# Patient Record
Sex: Male | Born: 1975
Health system: Southern US, Community
[De-identification: ages and names within clinical notes are randomized; demographics above are authoritative.]

## PROBLEM LIST (undated history)

## (undated) DIAGNOSIS — I70229 Atherosclerosis of native arteries of extremities with rest pain, unspecified extremity: Secondary | ICD-10-CM

## (undated) DIAGNOSIS — Z955 Presence of coronary angioplasty implant and graft: Secondary | ICD-10-CM

## (undated) DIAGNOSIS — I2510 Atherosclerotic heart disease of native coronary artery without angina pectoris: Secondary | ICD-10-CM

## (undated) DIAGNOSIS — I82890 Acute embolism and thrombosis of other specified veins: Secondary | ICD-10-CM

## (undated) DIAGNOSIS — I739 Peripheral vascular disease, unspecified: Secondary | ICD-10-CM

## (undated) DIAGNOSIS — R700 Elevated erythrocyte sedimentation rate: Secondary | ICD-10-CM

## (undated) DIAGNOSIS — U071 COVID-19: Secondary | ICD-10-CM

## (undated) DIAGNOSIS — I214 Non-ST elevation (NSTEMI) myocardial infarction: Secondary | ICD-10-CM

## (undated) DIAGNOSIS — E785 Hyperlipidemia, unspecified: Secondary | ICD-10-CM

## (undated) HISTORY — DX: Atherosclerosis of native arteries of extremities with rest pain, unspecified extremity: I70.229

## (undated) HISTORY — PX: OTHER SURGICAL HISTORY: SHX169

## (undated) HISTORY — DX: Elevated erythrocyte sedimentation rate: R70.0

## (undated) HISTORY — PX: PERCUTANEOUS CORONARY STENT INTERVENTION (PCI-S): SHX6016

## (undated) HISTORY — PX: WRIST SURGERY: SHX841

---

## 2011-11-12 ENCOUNTER — Ambulatory Visit: Payer: Self-pay

## 2016-02-22 ENCOUNTER — Emergency Department (HOSPITAL_COMMUNITY): Payer: 59

## 2016-02-22 ENCOUNTER — Inpatient Hospital Stay (HOSPITAL_COMMUNITY)
Admission: EM | Admit: 2016-02-22 | Discharge: 2016-02-24 | DRG: 247 | Disposition: A | Discharge status: Home / Self Care | Payer: 59 | Attending: Cardiovascular Disease | Admitting: Cardiovascular Disease

## 2016-02-22 ENCOUNTER — Encounter (HOSPITAL_COMMUNITY): Payer: Self-pay | Admitting: Cardiology

## 2016-02-22 ENCOUNTER — Encounter (HOSPITAL_COMMUNITY)
Admission: EM | Disposition: A | Discharge status: Home / Self Care | Payer: Self-pay | Source: Home / Self Care | Attending: Cardiovascular Disease

## 2016-02-22 DIAGNOSIS — I472 Ventricular tachycardia: Secondary | ICD-10-CM | POA: Diagnosis present

## 2016-02-22 DIAGNOSIS — F17210 Nicotine dependence, cigarettes, uncomplicated: Secondary | ICD-10-CM | POA: Diagnosis present

## 2016-02-22 DIAGNOSIS — Z955 Presence of coronary angioplasty implant and graft: Secondary | ICD-10-CM | POA: Insufficient documentation

## 2016-02-22 DIAGNOSIS — R079 Chest pain, unspecified: Secondary | ICD-10-CM | POA: Diagnosis present

## 2016-02-22 DIAGNOSIS — I252 Old myocardial infarction: Secondary | ICD-10-CM | POA: Diagnosis present

## 2016-02-22 DIAGNOSIS — E785 Hyperlipidemia, unspecified: Secondary | ICD-10-CM | POA: Diagnosis present

## 2016-02-22 DIAGNOSIS — I214 Non-ST elevation (NSTEMI) myocardial infarction: Principal | ICD-10-CM | POA: Insufficient documentation

## 2016-02-22 DIAGNOSIS — I2510 Atherosclerotic heart disease of native coronary artery without angina pectoris: Secondary | ICD-10-CM | POA: Diagnosis present

## 2016-02-22 DIAGNOSIS — E784 Other hyperlipidemia: Secondary | ICD-10-CM | POA: Diagnosis not present

## 2016-02-22 DIAGNOSIS — F17200 Nicotine dependence, unspecified, uncomplicated: Secondary | ICD-10-CM | POA: Diagnosis not present

## 2016-02-22 HISTORY — PX: CARDIAC CATHETERIZATION: SHX172

## 2016-02-22 HISTORY — DX: Hyperlipidemia, unspecified: E78.5

## 2016-02-22 HISTORY — DX: Non-ST elevation (NSTEMI) myocardial infarction: I21.4

## 2016-02-22 LAB — BASIC METABOLIC PANEL
Anion gap: 8 (ref 5–15)
BUN: 8 mg/dL (ref 6–20)
CO2: 25 mmol/L (ref 22–32)
Calcium: 8.8 mg/dL — ABNORMAL LOW (ref 8.9–10.3)
Chloride: 104 mmol/L (ref 101–111)
Creatinine, Ser: 0.87 mg/dL (ref 0.61–1.24)
GFR calc Af Amer: >60 mL/min (ref >60)
GFR calc non Af Amer: >60 mL/min (ref >60)
Glucose, Bld: 117 mg/dL — ABNORMAL HIGH (ref 65–99)
Potassium: 4.2 mmol/L (ref 3.5–5.1)
Sodium: 137 mmol/L (ref 135–145)

## 2016-02-22 LAB — TROPONIN I
Troponin I: 0.06 ng/mL — ABNORMAL HIGH (ref <0.031)
Troponin I: 0.27 ng/mL — ABNORMAL HIGH (ref <0.031)

## 2016-02-22 LAB — POCT ACTIVATED CLOTTING TIME
Activated Clotting Time: 126 seconds
Activated Clotting Time: 332 seconds
Activated Clotting Time: 281 seconds

## 2016-02-22 LAB — CBC
HCT: 47.8 % (ref 39.0–52.0)
Hemoglobin: 16.6 g/dL (ref 13.0–17.0)
MCH: 31.5 pg (ref 26.0–34.0)
MCHC: 34.7 g/dL (ref 30.0–36.0)
MCV: 90.7 fL (ref 78.0–100.0)
Platelets: 201 10*3/uL (ref 150–400)
RBC: 5.27 MIL/uL (ref 4.22–5.81)
RDW: 13.1 % (ref 11.5–15.5)
WBC: 8.8 10*3/uL (ref 4.0–10.5)

## 2016-02-22 LAB — APTT: aPTT: 30 seconds (ref 24–37)

## 2016-02-22 LAB — MAGNESIUM: Magnesium: 2.3 mg/dL (ref 1.7–2.4)

## 2016-02-22 LAB — PROTIME-INR
INR: 1.09 (ref 0.00–1.49)
Prothrombin Time: 14.3 seconds (ref 11.6–15.2)

## 2016-02-22 SURGERY — LEFT HEART CATH AND CORONARY ANGIOGRAPHY
Anesthesia: LOCAL

## 2016-02-22 MED ORDER — LIDOCAINE HCL (PF) 1 % IJ SOLN
INTRAMUSCULAR | Status: DC | PRN
  Administered 2016-02-22: 2 mL

## 2016-02-22 MED ORDER — OXYCODONE-ACETAMINOPHEN 5-325 MG PO TABS: 1.0000 | ORAL_TABLET | ORAL | Status: DC | PRN

## 2016-02-22 MED ORDER — SODIUM CHLORIDE 0.9 % WEIGHT BASED INFUSION: 3.0000 mL/kg/h | INTRAVENOUS | Status: DC

## 2016-02-22 MED ORDER — SODIUM CHLORIDE 0.9 % IV SOLN: 250.0000 mL | INTRAVENOUS | Status: DC | PRN

## 2016-02-22 MED ORDER — IOPAMIDOL (ISOVUE-370) INJECTION 76%
INTRAVENOUS | Status: AC
  Filled 2016-02-22: qty 100

## 2016-02-22 MED ORDER — SODIUM CHLORIDE 0.9 % WEIGHT BASED INFUSION: 1.0000 mL/kg/h | INTRAVENOUS | Status: DC

## 2016-02-22 MED ORDER — VERAPAMIL HCL 2.5 MG/ML IV SOLN
INTRAVENOUS | Status: DC | PRN
  Administered 2016-02-22: 10 mL via INTRA_ARTERIAL

## 2016-02-22 MED ORDER — TIROFIBAN HCL IV 12.5 MG/250 ML
INTRAVENOUS | Status: DC | PRN
  Administered 2016-02-22: .15 ug/kg/min via INTRAVENOUS

## 2016-02-22 MED ORDER — IOPAMIDOL (ISOVUE-370) INJECTION 76%
INTRAVENOUS | Status: DC | PRN
  Administered 2016-02-22: 195 mL via INTRAVENOUS

## 2016-02-22 MED ORDER — MORPHINE SULFATE (PF) 2 MG/ML IV SOLN
2.0000 mg | INTRAVENOUS | Status: DC | PRN
  Administered 2016-02-23: 2 mg via INTRAVENOUS
  Filled 2016-02-22: qty 1

## 2016-02-22 MED ORDER — MIDAZOLAM HCL 2 MG/2ML IJ SOLN
INTRAMUSCULAR | Status: AC
  Filled 2016-02-22: qty 2

## 2016-02-22 MED ORDER — HEPARIN SODIUM (PORCINE) 1000 UNIT/ML IJ SOLN
INTRAMUSCULAR | Status: AC
  Filled 2016-02-22: qty 1

## 2016-02-22 MED ORDER — TIROFIBAN HCL IV 12.5 MG/250 ML
INTRAVENOUS | Status: AC
  Filled 2016-02-22: qty 250

## 2016-02-22 MED ORDER — HEPARIN (PORCINE) IN NACL 100-0.45 UNIT/ML-% IJ SOLN
850.0000 [IU]/h | INTRAMUSCULAR | Status: DC
  Administered 2016-02-22: 850 [IU]/h via INTRAVENOUS
  Filled 2016-02-22: qty 250

## 2016-02-22 MED ORDER — HEPARIN SODIUM (PORCINE) 1000 UNIT/ML IJ SOLN
INTRAMUSCULAR | Status: DC | PRN
  Administered 2016-02-22: 5000 [IU] via INTRAVENOUS
  Administered 2016-02-22: 7000 [IU] via INTRAVENOUS

## 2016-02-22 MED ORDER — TICAGRELOR 90 MG PO TABS
90.0000 mg | ORAL_TABLET | Freq: Two times a day (BID) | ORAL | Status: DC
  Administered 2016-02-23 – 2016-02-24 (×4): 90 mg via ORAL
  Filled 2016-02-22 (×4): qty 1

## 2016-02-22 MED ORDER — ONDANSETRON HCL 4 MG/2ML IJ SOLN: 4.0000 mg | Freq: Four times a day (QID) | INTRAMUSCULAR | Status: DC | PRN

## 2016-02-22 MED ORDER — SODIUM CHLORIDE 0.9 % IV SOLN: INTRAVENOUS | Status: AC

## 2016-02-22 MED ORDER — BIVALIRUDIN BOLUS VIA INFUSION - CUPID
INTRAVENOUS | Status: DC | PRN
  Administered 2016-02-22: 54.45 mg via INTRAVENOUS

## 2016-02-22 MED ORDER — FENTANYL CITRATE (PF) 100 MCG/2ML IJ SOLN
INTRAMUSCULAR | Status: AC
  Filled 2016-02-22: qty 2

## 2016-02-22 MED ORDER — HEPARIN (PORCINE) IN NACL 2-0.9 UNIT/ML-% IJ SOLN
INTRAMUSCULAR | Status: AC
  Filled 2016-02-22: qty 1000

## 2016-02-22 MED ORDER — NITROGLYCERIN 1 MG/10 ML FOR IR/CATH LAB
INTRA_ARTERIAL | Status: AC
  Filled 2016-02-22: qty 10

## 2016-02-22 MED ORDER — ACETAMINOPHEN 325 MG PO TABS: 650.0000 mg | ORAL_TABLET | ORAL | Status: DC | PRN

## 2016-02-22 MED ORDER — BIVALIRUDIN 250 MG IV SOLR
INTRAVENOUS | Status: AC
  Filled 2016-02-22: qty 250

## 2016-02-22 MED ORDER — SODIUM CHLORIDE 0.9 % IV SOLN: 250.0000 mg | INTRAVENOUS | Status: DC | PRN

## 2016-02-22 MED ORDER — TICAGRELOR 90 MG PO TABS
ORAL_TABLET | ORAL | Status: AC
  Filled 2016-02-22: qty 2

## 2016-02-22 MED ORDER — TIROFIBAN HCL IN NACL 5-0.9 MG/100ML-% IV SOLN
0.1500 ug/kg/min | INTRAVENOUS | Status: AC
  Filled 2016-02-22 (×2): qty 100

## 2016-02-22 MED ORDER — ASPIRIN 81 MG PO CHEW: 81.0000 mg | CHEWABLE_TABLET | ORAL | Status: DC

## 2016-02-22 MED ORDER — IOPAMIDOL (ISOVUE-370) INJECTION 76%
INTRAVENOUS | Status: AC
  Filled 2016-02-22: qty 50

## 2016-02-22 MED ORDER — NITROGLYCERIN 0.4 MG SL SUBL
0.4000 mg | SUBLINGUAL_TABLET | SUBLINGUAL | Status: AC | PRN
  Administered 2016-02-22 (×3): 0.4 mg via SUBLINGUAL
  Filled 2016-02-22: qty 1

## 2016-02-22 MED ORDER — ANGIOPLASTY BOOK
Freq: Once | Status: AC
  Administered 2016-02-22: 22:00:00
  Filled 2016-02-22: qty 1

## 2016-02-22 MED ORDER — TIROFIBAN (AGGRASTAT) BOLUS VIA INFUSION
INTRAVENOUS | Status: DC | PRN
  Administered 2016-02-22: 1815 ug via INTRAVENOUS

## 2016-02-22 MED ORDER — MIDAZOLAM HCL 2 MG/2ML IJ SOLN
INTRAMUSCULAR | Status: DC | PRN
  Administered 2016-02-22: 1 mg via INTRAVENOUS
  Administered 2016-02-22 (×2): 2 mg via INTRAVENOUS
  Administered 2016-02-22: 1 mg via INTRAVENOUS

## 2016-02-22 MED ORDER — SODIUM CHLORIDE 0.9% FLUSH: 3.0000 mL | Freq: Two times a day (BID) | INTRAVENOUS | Status: DC

## 2016-02-22 MED ORDER — ASPIRIN EC 81 MG PO TBEC
81.0000 mg | DELAYED_RELEASE_TABLET | Freq: Every day | ORAL | Status: DC
  Administered 2016-02-23 – 2016-02-24 (×2): 81 mg via ORAL
  Filled 2016-02-22 (×2): qty 1

## 2016-02-22 MED ORDER — NICOTINE 21 MG/24HR TD PT24
21.0000 mg | MEDICATED_PATCH | Freq: Every day | TRANSDERMAL | Status: DC
  Administered 2016-02-22 – 2016-02-24 (×3): 21 mg via TRANSDERMAL
  Filled 2016-02-22 (×4): qty 1

## 2016-02-22 MED ORDER — SODIUM CHLORIDE 0.9% FLUSH: 3.0000 mL | INTRAVENOUS | Status: DC | PRN

## 2016-02-22 MED ORDER — TICAGRELOR 90 MG PO TABS
ORAL_TABLET | ORAL | Status: DC | PRN
  Administered 2016-02-22: 180 mg via ORAL

## 2016-02-22 MED ORDER — VERAPAMIL HCL 2.5 MG/ML IV SOLN
INTRAVENOUS | Status: AC
  Filled 2016-02-22: qty 2

## 2016-02-22 MED ORDER — ATORVASTATIN CALCIUM 40 MG PO TABS
80.0000 mg | ORAL_TABLET | Freq: Every day | ORAL | Status: DC
  Administered 2016-02-23: 80 mg via ORAL
  Filled 2016-02-22 (×2): qty 1
  Filled 2016-02-22: qty 2
  Filled 2016-02-22: qty 1

## 2016-02-22 MED ORDER — HEART ATTACK BOUNCING BOOK
Freq: Once | Status: AC
  Administered 2016-02-22: 22:00:00
  Filled 2016-02-22: qty 1

## 2016-02-22 MED ORDER — NITROGLYCERIN 1 MG/10 ML FOR IR/CATH LAB
INTRA_ARTERIAL | Status: DC | PRN
  Administered 2016-02-22: 100 ug
  Administered 2016-02-22 (×2): 200 ug via INTRACORONARY

## 2016-02-22 MED ORDER — FENTANYL CITRATE (PF) 100 MCG/2ML IJ SOLN
INTRAMUSCULAR | Status: DC | PRN
  Administered 2016-02-22: 50 ug via INTRAVENOUS
  Administered 2016-02-22: 25 ug via INTRAVENOUS
  Administered 2016-02-22: 50 ug via INTRAVENOUS
  Administered 2016-02-22: 25 ug via INTRAVENOUS

## 2016-02-22 MED ORDER — SODIUM CHLORIDE 0.9% FLUSH
3.0000 mL | Freq: Two times a day (BID) | INTRAVENOUS | Status: DC
  Administered 2016-02-23 – 2016-02-24 (×3): 3 mL via INTRAVENOUS

## 2016-02-22 MED ORDER — METOPROLOL TARTRATE 25 MG PO TABS
12.5000 mg | ORAL_TABLET | Freq: Once | ORAL | Status: AC
  Administered 2016-02-22: 12.5 mg via ORAL
  Filled 2016-02-22: qty 1

## 2016-02-22 MED ORDER — METOPROLOL TARTRATE 25 MG PO TABS: 12.5000 mg | ORAL_TABLET | Freq: Once | ORAL | Status: DC

## 2016-02-22 MED ORDER — HEPARIN BOLUS VIA INFUSION
4000.0000 [IU] | Freq: Once | INTRAVENOUS | Status: AC
  Administered 2016-02-22: 4000 [IU] via INTRAVENOUS

## 2016-02-22 MED ORDER — ALPRAZOLAM 0.5 MG PO TABS
0.5000 mg | ORAL_TABLET | Freq: Two times a day (BID) | ORAL | Status: DC | PRN
  Administered 2016-02-22 – 2016-02-23 (×2): 0.5 mg via ORAL
  Filled 2016-02-22 (×2): qty 1

## 2016-02-22 MED ORDER — HEPARIN (PORCINE) IN NACL 2-0.9 UNIT/ML-% IJ SOLN
INTRAMUSCULAR | Status: DC | PRN
  Administered 2016-02-22: 1000 mL

## 2016-02-22 SURGICAL SUPPLY — 24 items
BALLN EMERGE MR 2.5X15 (BALLOONS) ×2
BALLN ~~LOC~~ EUPHORA RX 3.25X15 (BALLOONS) ×2
BALLOON EMERGE MR 2.5X15 (BALLOONS) ×1 IMPLANT
BALLOON ~~LOC~~ EUPHORA RX 3.25X15 (BALLOONS) ×1 IMPLANT
CATH EXTRAC PRONTO 5.5F 138CM (CATHETERS) ×2 IMPLANT
CATH INFINITI 5 FR JL3.5 (CATHETERS) ×2 IMPLANT
CATH INFINITI 5FR ANG PIGTAIL (CATHETERS) ×2 IMPLANT
CATH INFINITI JR4 5F (CATHETERS) ×2 IMPLANT
CATH VISTA GUIDE 6FR XBLAD3.5 (CATHETERS) ×2 IMPLANT
DEVICE RAD COMP TR BAND LRG (VASCULAR PRODUCTS) ×2 IMPLANT
GLIDESHEATH SLEND SS 6F .021 (SHEATH) ×2 IMPLANT
KIT ENCORE 26 ADVANTAGE (KITS) ×2 IMPLANT
KIT PV (KITS) ×2 IMPLANT
STENT PROMUS PREM MR 3.0X20 (Permanent Stent) ×2 IMPLANT
STENT PROMUS PREM MR 3.5X12 (Permanent Stent) ×2 IMPLANT
STENT XIENCE ALPINE RX 4.0X12 (Permanent Stent) ×2 IMPLANT
SYR MEDRAD MARK V 150ML (SYRINGE) ×2 IMPLANT
TRANSDUCER W/STOPCOCK (MISCELLANEOUS) ×2 IMPLANT
TRAY PV CATH (CUSTOM PROCEDURE TRAY) ×2 IMPLANT
TUBING CIL FLEX 10 FLL-RA (TUBING) ×2 IMPLANT
TUBING CONTRAST HIGH PRESS 20 (MISCELLANEOUS) ×2 IMPLANT
WIRE COUGAR XT STRL 190CM (WIRE) ×2 IMPLANT
WIRE SAFE-T 1.5MM-J .035X260CM (WIRE) ×2 IMPLANT
WIRE TORQFLEX AUST .018X40CM (WIRE) ×2 IMPLANT

## 2016-02-22 NOTE — Consult Note (Addendum)
  CARDIOLOGY CONSULT NOTE   Patient ID: Evan Rodriguez MRN: [id number] DOB/AGE: 42/23/1977 39 y.o.  Admit Date: 02/22/2016 Referring Physician: ER Physician-Wickline  Primary Physician: No primary care provider on file. Consulting Cardiologist: Salahuddin Arismendez MD Primary Cardiologist: New Reason for Consultation: Chest Pain  Clinical Summary Evan Rodriguez is a 39 y.o.male with no prior history of coronary disease, who works as a truck driver, presented to the emergency room after experiencing chest tightness which began around 5:45 AM while he was preparing to pull out of the trucking lot to begin his day of work. He began to feel chest tightness when he was walking around his truck to get ID numbers to input into computer. He began to feel chest tightness that worsened as he began to drive. Described as a severe tightness 10 over 10. Associated shortness of breath but no diaphoresis dizziness and nausea.   The patient continued further approximately 20 minutes before he pulled over and called his dispatcher to notify her of what was happening. In the interim he took 2 aspirin. He began to notice some left arm numbness. When the pain did not go away within the next few minutes, he called a dispatcher back who called EMS and he was brought to ER. In route he received 2 sublingual nitroglycerin. Pain decreased but did not subside.  On arrival blood pressure 116/94, heart rate 82, O2 sat 99%, he was afebrile. Initial troponin 0.06, follow-up troponin 0.27. Other labs were essentially unremarkable with the exception of glucose at 117. Creatinine 0.87. Chest x-ray revealed no acute cardiopulmonary findings, bronchitic changes. EKG revealed no acute ST-T wave abnormalities.  The patient's wife at bedside states that he had discomfort in his chest last week. Lasting approximately 2 hours. Going away on its own. The patient also admits to doing some heavy lifting and moving furniture over the weekend.  Patient states he just finished smoking a cigarette when the chest pain began. His wife also states that he drinks a lot of caffeine and monster drinks. He has had no problems with dysphasia or hypertension in the past.  The patient has been started on heparin, he continues his have some chest soreness on the left "2/10"  No Known Allergies  Medications Scheduled Medications: . heparin  4,000 Units Intravenous Once    Infusions: . heparin 850 Units/hr (02/22/16 0954)    PRN Medications:     History reviewed. No pertinent past medical history.  Past Surgical History  Procedure Laterality Date  . Wrist repairr      Family History  Problem Relation Age of Onset  . Hypertension Maternal Grandfather     Social History Evan Rodriguez reports that he has been smoking Cigarettes.  He has a 30 pack-year smoking history. He does not have any smokeless tobacco history on file. Evan Rodriguez reports that he drinks alcohol.  Review of Systems Complete review of systems are found to be negative unless outlined in H&P above.  Physical Examination Blood pressure 108/77, pulse 69, temperature 97.7 F (36.5 C), temperature source Oral, resp. rate 14, height 5' 6" (1.676 m), weight 160 lb (72.576 kg), SpO2 98 %. No intake or output data in the 24 hours ending 02/22/16 1035  Telemetry: Normal sinus rhythm.  GEN: No acute distress. HEENT: Conjunctiva and lids normal, oropharynx clear with moist mucosa. Neck: Supple, no elevated JVP or carotid bruits, no thyromegaly. Lungs: Clear to auscultation, nonlabored breathing at rest. Cardiac: Regular rate and rhythm, no S3 or significant   systolic murmur, no pericardial rub. Abdomen: Soft, nontender, no hepatomegaly, bowel sounds present, no guarding or rebound. Extremities: No pitting edema, distal pulses 2+. Skin: Warm and dry. Musculoskeletal: No kyphosis. Neuropsychiatric: Alert and oriented x3, affect grossly appropriate.  Prior Cardiac  Testing/Procedures None. Lab Results  Basic Metabolic Panel:  Recent Labs Lab 02/22/16 0738  NA 137  K 4.2  CL 104  CO2 25  GLUCOSE 117*  BUN 8  CREATININE 0.87  CALCIUM 8.8*    CBC:  Recent Labs Lab 02/22/16 0738  WBC 8.8  HGB 16.6  HCT 47.8  MCV 90.7  PLT 201    Cardiac Enzymes:  Recent Labs Lab 02/22/16 0738 02/22/16 0945  TROPONINI 0.06* 0.27*     Radiology: Dg Chest 2 View  02/22/2016  CLINICAL DATA:  Chest pain since this morning.  History of smoking. EXAM: CHEST  2 VIEW COMPARISON:  None. FINDINGS: The cardiac silhouette, mediastinal and hilar contours are within normal limits. There are bronchitic changes with peribronchial thickening and increased interstitial markings, likely related to smoking. No infiltrates or effusions. The bony thorax is intact. Slightly irregular appearance of the right humeral head. Could not exclude AVN. Dedicated left shoulder films are recommended. IMPRESSION: No acute cardiopulmonary findings. Bronchitic changes, likely related to smoking. Irregular appearance of the right humeral head could be artifactual but could not exclude AVN. Recommend dedicated right shoulder films. Electronically Signed   By: P.  Gallerani M.D.   On: 02/22/2016 08:13     ECG: Normal sinus rhythm, rate of 73 bpm.   Impression and Recommendations  1. Chest pain: Non-ST elevation MI Troponin increasing from 0.06-0.27. EKG remains normal without evidence of ACS. He continues to have mild chest soreness on the left. Heparin drip now started. Patient will be transferred to [facility] for cardiac catheterization in the setting. I've explained the cardiac catheterization procedure, risks, benefits, and answered all questions. He is willing to transfer to [facility] for procedure. Will have lipids and LFTs drawn. Dr. Valincia Touch to see prior to transfer.   2. Ongoing tobacco abuse: Patient smokes 1-1/2 packs a day and has been doing so for 20 years. Smoking  cessation is strongly recommended. Add Nicotine Patch.    Signed: Kathryn M. Lawrence NP AACC  02/22/2016, 10:35 AM Co-Sign MD  Patient seen and discussed with NP Lawrence, I agree with her documentation. 39 yo male with no previous cardiac history admitted with chest pain. EKG without acute ischemic changes, however initially troponin 0.06-->0.27, combined with chest pain symptoms presentation consistent with NSTEMI. He took ASA prior to arrival, currently on heparin drip. We will write for  high dose statin. Soft bp's, will not dose beta blocker or ACE-I at this time. We will plan for transfer to [place] for heart cath.   J Addisyn Leclaire MD 

## 2016-02-22 NOTE — ED Provider Notes (Signed)
CSN: [id number]     Arrival date & time 02/22/16  [id number] History   First MD Initiated Contact with Patient 02/22/16 0754     Chief Complaint  Patient presents with  . Chest Pain     Patient is a 40 y.o. male presenting with chest pain. The history is provided by the patient.  Chest Pain Pain location:  L chest Pain quality: aching and dull   Pain radiates to:  L shoulder Pain severity:  Moderate Onset quality:  Sudden Duration:  2 hours Timing:  Constant Progression:  Unchanged Chronicity:  New Relieved by:  Nothing Worsened by:  Nothing tried Ineffective treatments:  Aspirin and nitroglycerin Associated symptoms: shortness of breath   Associated symptoms: no abdominal pain, no cough, no diaphoresis, no lower extremity edema, no syncope and not vomiting   Risk factors: smoking   Risk factors: no coronary artery disease, no high cholesterol, no hypertension and no prior DVT/PE   pt reports sudden onset of left chest/shoulder pain about 2 hrs ago He has never had this before He reports feeling SOB He was driving when this occurred He called 911 EMS gave him ASA/NTG   PMH - none Soc hx - smoker Family history - negative for CAD Social History  Substance Use Topics  . Smoking status: Current Every Day Smoker  . Smokeless tobacco: None  . Alcohol Use: No    Review of Systems  Constitutional: Negative for diaphoresis.  Respiratory: Positive for shortness of breath. Negative for cough.   Cardiovascular: Positive for chest pain. Negative for syncope.  Gastrointestinal: Negative for vomiting and abdominal pain.  Neurological: Negative for syncope.  All other systems reviewed and are negative.     Allergies  Review of patient's allergies indicates no known allergies.  Home Medications   Prior to Admission medications   Not on File   BP 116/90 mmHg  Pulse 75  Temp(Src) 97.7 F (36.5 C) (Oral)  Resp 16  Ht 5\' 6"  (1.676 m)  Wt 72.576 kg  BMI 25.84 kg/m2  SpO2  96% Physical Exam CONSTITUTIONAL: Well developed/well nourished HEAD: Normocephalic/atraumatic EYES: EOMI/PERRL ENMT: Mucous membranes moist NECK: supple no meningeal signs SPINE/BACK:entire spine nontender CV: S1/S2 noted, no murmurs/rubs/gallops noted LUNGS: Lungs are clear to auscultation bilaterally, no apparent distress ABDOMEN: soft, nontender, no rebound or guarding, bowel sounds noted throughout abdomen GU:no cva tenderness NEURO: Pt is awake/alert/appropriate, moves all extremitiesx4.  No facial droop.   EXTREMITIES: pulses normal/equal, full ROM, no LE edema noted SKIN: warm, color normal PSYCH: no abnormalities of mood noted, alert and oriented to situation  ED Course  Procedures  CRITICAL CARE Performed by: Joya GaskinsWICKLINE,Janyah Singleterry W Total critical care time: 40 minutes Critical care time was exclusive of separately billable procedures and treating other patients. Critical care was necessary to treat or prevent imminent or life-threatening deterioration. Critical care was time spent personally by me on the following activities: development of treatment plan with patient and/or surrogate as well as nursing, discussions with consultants, evaluation of patient's response to treatment, examination of patient, obtaining history from patient or surrogate, ordering and performing treatments and interventions, ordering and review of laboratory studies, ordering and review of radiographic studies, pulse oximetry and re-evaluation of patient's condition. PATIENT WITH NON-STEMI, ELEVATED TROPONIN, HE IS RECEIVING NITROGLYCERIN AND HEPARIN AND TRANSFER TO CARDIAC CENTER FOR CARDIAC CATHETERIZATION 8:51 AM Elevated troponin noted No acute EKG changes on repeat NTG ordered Already given ASA Will consult cardiology 9:29 AM D/w dr branch with cardiology  They will see patient in the ED 10:47 AM SEEN BY CARDIOLOGY HIS TROPONIN IS RISING IN THE ER HE WILL BE TRANSFERRED TO CARDIAC CENTER FOR  CARDIAC CATH Labs Review Labs Reviewed  BASIC METABOLIC PANEL - Abnormal; Notable for the following:    Glucose, Bld 117 (*)    Calcium 8.8 (*)    All other components within normal limits  TROPONIN I - Abnormal; Notable for the following:    Troponin I 0.06 (*)    All other components within normal limits  TROPONIN I - Abnormal; Notable for the following:    Troponin I 0.27 (*)    All other components within normal limits  CBC  APTT  PROTIME-INR  HEPARIN LEVEL (UNFRACTIONATED)    Imaging Review Dg Chest 2 View  02/22/2016  CLINICAL DATA:  Chest pain since this morning.  History of smoking. EXAM: CHEST  2 VIEW COMPARISON:  None. FINDINGS: The cardiac silhouette, mediastinal and hilar contours are within normal limits. There are bronchitic changes with peribronchial thickening and increased interstitial markings, likely related to smoking. No infiltrates or effusions. The bony thorax is intact. Slightly irregular appearance of the right humeral head. Could not exclude AVN. Dedicated left shoulder films are recommended. IMPRESSION: No acute cardiopulmonary findings. Bronchitic changes, likely related to smoking. Irregular appearance of the right humeral head could be artifactual but could not exclude AVN. Recommend dedicated right shoulder films. Electronically Signed   By: Rudie Meyer M.D.   On: 02/22/2016 08:13   I have personally reviewed and evaluated these images and lab results as part of my medical decision-making.   EKG Interpretation   Date/Time:  Friday February 22 2016 07:25:57 EDT Ventricular Rate:  81 PR Interval:  132 QRS Duration: 70 QT Interval:  374 QTC Calculation: 434 R Axis:   71 Text Interpretation:  Sinus rhythm Normal ECG No previous ECGs available  Confirmed by Bebe Shaggy  MD, Elin Fenley ([id number]) on 02/22/2016 7:35:02 AM      EKG Interpretation  Date/Time:  Friday February 22 2016 08:42:45 EDT Ventricular Rate:  76 PR Interval:  140 QRS Duration: 78 QT  Interval:  385 QTC Calculation: 433 R Axis:   75 Text Interpretation:  Sinus rhythm Normal ECG No significant change since last tracing Confirmed by Bebe Shaggy  MD, Clemma Johnsen ([id number]) on 02/22/2016 8:50:52 AM      Medications  heparin bolus via infusion 4,000 Units (not administered)  heparin ADULT infusion 100 units/mL (25000 units/250 mL) (850 Units/hr Intravenous New Bag/Given 02/22/16 0954)  nitroGLYCERIN (NITROSTAT) SL tablet 0.4 mg (0.4 mg Sublingual Given 02/22/16 0916)    MDM   Final diagnoses:  Non-STEMI (non-ST elevated myocardial infarction) [facility])    Nursing notes including past medical history and social history reviewed and considered in documentation xrays/imaging reviewed by myself and considered during evaluation Labs/vital reviewed myself and considered during evaluation     Zadie Rhine, MD 02/22/16 1048

## 2016-02-22 NOTE — Progress Notes (Signed)
Patient with runs of NSVT, asymptomatic with stable blood pressure. 12 lead continues to show no acute ischemic changes. On my exam he is resting comfortably, tele currently showing NSR rate 60 without ongoing ventricular ectopy. Given low dose lopressor 12.5mg  x 1 dose, borderline low heart rates and bp's but tolerated. K and Mg at goal. Continue to monitor, he is for transfer and cath today.    Dominga FerryJ Janiylah Hannis MD

## 2016-02-22 NOTE — H&P (View-Only) (Signed)
CARDIOLOGY CONSULT NOTE   Patient ID: Evan Rodriguez MRN: [id number] DOB/AGE: [birth year] 40 y.o.  Admit Date: 02/22/2016 Referring Physician: ER Physician-Wickline  Primary Physician: No primary care provider on file. 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 He began to feel chest tightness when he was walking around his truck to get ID numbers to input into computer. He began to feel chest tightness that worsened as he began to drive. Described as a severe tightness 10 over 10. Associated shortness of breath but no diaphoresis dizziness and nausea.   The patient continued further approximately 20 minutes before he pulled over and called his dispatcher to notify her of what was happening. In the interim he took 2 aspirin. He began to notice some left arm numbness. When the pain did not go away within the next few minutes, he called a dispatcher back who called EMS and he was brought to ER. In route he received 2 sublingual nitroglycerin. Pain decreased but did not subside.  On arrival blood pressure 116/94, heart rate 82, O2 sat 99%, he was afebrile. Initial troponin 0.06, follow-up troponin 0.27. Other labs were essentially unremarkable with the exception of glucose at 117. Creatinine 0.87. Chest x-ray revealed no acute cardiopulmonary findings, bronchitic changes. EKG revealed no acute ST-T wave abnormalities.  The patient's wife at bedside states that he had discomfort in his chest last week. Lasting approximately 2 hours. Going away on its own. The patient also admits to doing some heavy lifting and moving furniture over the weekend.  Patient states he just finished smoking a cigarette when the chest pain began. His wife also states that he drinks a lot of caffeine and monster drinks. He has had no problems with dysphasia or hypertension in the past.  The patient has been started on heparin, he continues his have some chest soreness on the left "2/10"  No Known Allergies  Medications Scheduled Medications: . heparin  4,000 Units Intravenous Once    Infusions: . heparin 850 Units/hr (02/22/16 0954)    PRN Medications:     History reviewed. No pertinent past medical history.  Past Surgical History  Procedure Laterality Date  . Wrist repairr      Family History  Problem Relation Age of Onset  . Hypertension Maternal Grandfather     Social History Mr. Hosking reports that he has been smoking Cigarettes.  He has a 30 pack-year smoking history. He does not have any smokeless tobacco history on file. Mr. Delahoussaye reports that he drinks alcohol.  Review of Systems Complete review of systems are found to be negative unless outlined in H&P above.  Physical Examination Blood pressure 108/77, pulse 69, temperature 97.7 F (36.5 C), temperature source Oral, resp. rate 14, height  (1.676 m), weight 160 lb (72.576 kg), SpO2 98 %. No intake or output data in the 24 hours ending 02/22/16 1035  Telemetry: Normal sinus rhythm.  GEN: No acute distress. HEENT: Conjunctiva and lids normal, oropharynx clear with moist mucosa. Neck: Supple, no elevated JVP or carotid bruits, no thyromegaly. Lungs: Clear to auscultation, nonlabored breathing at rest. Cardiac: Regular rate and rhythm, no S3 or significant  systolic murmur, no pericardial rub. Abdomen: Soft, nontender, no hepatomegaly, bowel sounds present, no guarding or rebound. Extremities: No pitting edema, distal pulses 2+. Skin: Warm and dry. Musculoskeletal: No kyphosis. Neuropsychiatric: Alert and oriented x3, affect grossly appropriate.  Prior Cardiac  Testing/Procedures None. Lab Results  Basic Metabolic Panel:  Recent Labs Lab 02/22/16 0738  NA 137  K 4.2  CL 104  CO2 25  GLUCOSE 117*  BUN 8  CREATININE 0.87  CALCIUM 8.8*    CBC:  Recent Labs Lab 02/22/16 0738  WBC 8.8  HGB 16.6  HCT 47.8  MCV 90.7  PLT 201    Cardiac Enzymes:  Recent Labs Lab 02/22/16 0738 02/22/16 0945  TROPONINI 0.06* 0.27*     Radiology: Dg Chest 2 View  02/22/2016  CLINICAL DATA:  Chest pain since this morning.  History of smoking. EXAM: CHEST  2 VIEW COMPARISON:  None. FINDINGS: The cardiac silhouette, mediastinal and hilar contours are within normal limits. There are bronchitic changes with peribronchial thickening and increased interstitial markings, likely related to smoking. No infiltrates or effusions. The bony thorax is intact. Slightly irregular appearance of the right humeral head. Could not exclude AVN. Dedicated left shoulder films are recommended. IMPRESSION: No acute cardiopulmonary findings. Bronchitic changes, likely related to smoking. Irregular appearance of the right humeral head could be artifactual but could not exclude AVN. Recommend dedicated right shoulder films. Electronically Signed   By: Rudie MeyerP.  Gallerani M.D.   On: 02/22/2016 08:13     ECG: Normal sinus rhythm, rate of 73 bpm.   Impression and Recommendations  1. Chest pain: Non-ST elevation MI Troponin increasing from 0.06-0.27. EKG remains normal without evidence of ACS. He continues to have mild chest soreness on the left. Heparin drip now started. Patient will be transferred to [facility] for cardiac catheterization in the setting. I've explained the cardiac catheterization procedure, risks, benefits, and answered all questions. [facility]. Will have lipids and LFTs drawn. Dr. Wyline MoodBranch to see prior to transfer.   2. Ongoing tobacco abuse: Patient smokes 1-1/2 packs a day and has been doing so for 20 years. Smoking  cessation is strongly recommended. Add Nicotine Patch.    Signed: Bettey MareKathryn M. Lawrence NP AACC  02/22/2016, 10:35 AM Co-Sign MD  Patient seen and discussed with NP Lyman BishopLawrence, I agree with her documentation. 40 yo male with no previous cardiac history admitted with chest pain. EKG without acute ischemic changes, however initially troponin 0.06-->0.27, combined with chest pain symptoms presentation consistent with NSTEMI. He took ASA prior to arrival, currently on heparin drip. We will write for  high dose statin. Soft bp's, will not dose beta blocker or ACE-I at this time. We will plan for transfer to [facility] for heart cath.   Dominga FerryJ Reyes Fifield MD

## 2016-02-22 NOTE — ED Notes (Signed)
Pt has had 3 runs of V tach.  Pt asymptomatic.  Pain free.  Er physician notified and called and notified Dr. Wyline MoodBranch.  Orders received.  Crash cart on bedside and pads placed on pt.

## 2016-02-22 NOTE — ED Provider Notes (Signed)
Pt had run of wide complex tachycardia, resolved He is feeling well, no CP EKG unchanged Cardiology has been paged I have asked to place him on pads in case he needs cardioversion   EKG Interpretation  Date/Time:  Friday February 22 2016 12:40:28 EDT Ventricular Rate:  72 PR Interval:  146 QRS Duration: 69 QT Interval:  408 QTC Calculation: 446 R Axis:   75 Text Interpretation:  Sinus rhythm No significant change since last tracing Normal ECG Confirmed by Bebe ShaggyWICKLINE  MD, Manessa Buley ([id number]) on 02/22/2016 12:44:55 PM     ]  Zadie Rhineonald Haneef Hallquist, MD 02/22/16 1245

## 2016-02-22 NOTE — ED Notes (Signed)
No changes in pain.

## 2016-02-22 NOTE — ED Notes (Signed)
Chest pain since 545 am.  Sharp pain to chest and tingling to left arm.  C/o sob.  Pt has had total 324mg  aspirin and EMS gave 1 SL ntg with no relief.

## 2016-02-22 NOTE — Interval H&P Note (Signed)
Cath Lab Visit (complete for each Cath Lab visit)  Clinical Evaluation Leading to the Procedure:   ACS: Yes.    Non-ACS:    Anginal Classification: CCS IV  Anti-ischemic medical therapy: No Therapy  Non-Invasive Test Results: No non-invasive testing performed  Prior CABG: No previous CABG  History and Physical Interval Note:  02/22/2016 4:30 PM  Evan Rodriguez  has presented today for surgery, with the diagnosis of Nstemi  The various methods of treatment have been discussed with the patient and family. After consideration of risks, benefits and other options for treatment, the patient has consented to  Procedure(s): Left Heart Cath and Coronary Angiography (N/A) as a surgical intervention .  The patient's history has been reviewed, patient examined, no change in status, stable for surgery.  I have reviewed the patient's chart and labs.  Questions were answered to the patient's satisfaction.     Tonny Bollmanooper, Muneeb

## 2016-02-22 NOTE — Progress Notes (Signed)
ANTICOAGULATION CONSULT NOTE - Initial Consult  Pharmacy Consult for HEPARIN Indication: chest pain/ACS  No Known Allergies  Patient Measurements: Height: 5\' 6"  (167.6 cm) Weight: 160 lb (72.576 kg) IBW/kg (Calculated) : 63.8 HEPARIN DW (KG): 72.6  Vital Signs: Temp: 97.7 F (36.5 C) (04/21 0724) Temp Source: Oral (04/21 0724) BP: 98/75 mmHg (04/21 0935) Pulse Rate: 74 (04/21 0935)  Labs:  Recent Labs  02/22/16 0738  HGB 16.6  HCT 47.8  PLT 201  CREATININE 0.87  TROPONINI 0.06*   Estimated Creatinine Clearance: 100.8 mL/min (by C-G formula based on Cr of 0.87).  Medical History: History reviewed. No pertinent past medical history.  Medications:   (Not in a hospital admission)  Assessment: 40 yo male with c/o chest pain.  Asked to initiate IV Heparin for ACS,  Goal of Therapy:  Heparin level 0.3-0.7 units/ml Monitor platelets by anticoagulation protocol: Yes   Plan:  Heparin 4000 units IV bolus now x 1 Heparin infusion at 850 units/hr Heparin level in 6-8 hours Heparin level and CBC daily while on Heparin  Margo AyeHall, Wynne Jury A 02/22/2016,9:41 AM

## 2016-02-22 NOTE — ED Notes (Signed)
Pt had a 20 beat run of V Tach.  Pt resting with eyes shut.  Dr.Wickline notified.  Dr. Wyline MoodBranch called.  [facility] calling for report.

## 2016-02-23 ENCOUNTER — Encounter (HOSPITAL_COMMUNITY): Payer: Self-pay | Admitting: Physician Assistant

## 2016-02-23 DIAGNOSIS — I214 Non-ST elevation (NSTEMI) myocardial infarction: Secondary | ICD-10-CM | POA: Insufficient documentation

## 2016-02-23 DIAGNOSIS — E785 Hyperlipidemia, unspecified: Secondary | ICD-10-CM | POA: Diagnosis present

## 2016-02-23 DIAGNOSIS — E784 Other hyperlipidemia: Secondary | ICD-10-CM

## 2016-02-23 DIAGNOSIS — Z955 Presence of coronary angioplasty implant and graft: Secondary | ICD-10-CM

## 2016-02-23 LAB — BASIC METABOLIC PANEL
Anion gap: 10 (ref 5–15)
BUN: 7 mg/dL (ref 6–20)
CO2: 22 mmol/L (ref 22–32)
Calcium: 8.6 mg/dL — ABNORMAL LOW (ref 8.9–10.3)
Chloride: 106 mmol/L (ref 101–111)
Creatinine, Ser: 0.8 mg/dL (ref 0.61–1.24)
GFR calc Af Amer: >60 mL/min (ref >60)
GFR calc non Af Amer: >60 mL/min (ref >60)
Glucose, Bld: 119 mg/dL — ABNORMAL HIGH (ref 65–99)
Potassium: 3.7 mmol/L (ref 3.5–5.1)
Sodium: 138 mmol/L (ref 135–145)

## 2016-02-23 LAB — CBC
HCT: 47.1 % (ref 39.0–52.0)
Hemoglobin: 15.7 g/dL (ref 13.0–17.0)
MCH: 30.4 pg (ref 26.0–34.0)
MCHC: 33.3 g/dL (ref 30.0–36.0)
MCV: 91.1 fL (ref 78.0–100.0)
Platelets: 195 10*3/uL (ref 150–400)
RBC: 5.17 MIL/uL (ref 4.22–5.81)
RDW: 13.1 % (ref 11.5–15.5)
WBC: 8.2 10*3/uL (ref 4.0–10.5)

## 2016-02-23 LAB — LIPID PANEL
Cholesterol: 200 mg/dL (ref 0–200)
HDL: 21 mg/dL — ABNORMAL LOW (ref >40)
LDL Cholesterol: 147 mg/dL — ABNORMAL HIGH (ref 0–99)
Total CHOL/HDL Ratio: 9.5 RATIO
Triglycerides: 162 mg/dL — ABNORMAL HIGH (ref <150)
VLDL: 32 mg/dL (ref 0–40)

## 2016-02-23 MED ORDER — CARVEDILOL 3.125 MG PO TABS
3.1250 mg | ORAL_TABLET | Freq: Two times a day (BID) | ORAL | Status: DC
  Administered 2016-02-23 – 2016-02-24 (×2): 3.125 mg via ORAL
  Filled 2016-02-23 (×2): qty 1

## 2016-02-23 MED ORDER — CARVEDILOL 3.125 MG PO TABS: 3.1250 mg | ORAL_TABLET | Freq: Two times a day (BID) | ORAL | Status: DC

## 2016-02-23 NOTE — Progress Notes (Signed)
CARDIAC REHAB PHASE I   PRE:  Rate/Rhythm: 98 SR  BP:  Supine: 118/84  Sitting:   Standing:    SaO2:   MODE:  Ambulation: 500 ft   POST:  Rate/Rhythm: 112 ST  BP:  Supine:   Sitting: 126/84  Standing:    SaO2:  0800-0900 Pt walked 500 ft with steady gait. Denied CP. Encouraged more walks with staff. MI education completed with pt and wife who voiced understanding. Discussed smoking cessation and gave handout. Pt did not want fake cigarette. Pt quit once cold [place] for a year. Stressed importance of brilinta with stent. RN to give brilinta card. Reviewed NTG use, ex ed and heart healthy diet. 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 Gave brochure in case anything changes with work.   Luetta Nuttingharlene Eulla Kochanowski, RN BSN  02/23/2016 8:54 AM

## 2016-02-23 NOTE — Plan of Care (Signed)
Problem: Education: Goal: Knowledge of [place] [facility] Education information/materials will improve Outcome: Completed/Met Date Met:  02/23/16 Discussed smoking cessation, medication changes, diet changes related to MI.

## 2016-02-23 NOTE — Progress Notes (Addendum)
Patient refused to have bed alarm on,pt educated on safety plan , patient's wife at bedside.[facility] Buxbaum [employer]

## 2016-02-23 NOTE — Care Management Note (Signed)
Case Management Note  Patient Details  Name: Evan Rodriguez MRN: [id number] Date of Birth: [birth year]  Subjective/Objective:                  NSTEMI   Action/Plan: CM spoke with patient at the bedside. Brilinta 30 day free trial card given to patient.   Expected Discharge Date:   02/24/16               Expected Discharge Plan:  Home/Self Care  In-House Referral:     Discharge planning Services  CM Consult, Medication Assistance  Post Acute Care Choice:    Choice offered to:     DME Arranged:    DME Agency:     HH Arranged:    HH Agency:     Status of Service:  Completed, signed off  Medicare Important Message Given:    Date Medicare IM Given:    Medicare IM give by:    Date Additional Medicare IM Given:    Additional Medicare Important Message give by:     If discussed at Long Length of Stay Meetings, dates discussed:    Additional Comments:  Antony HasteBennett, Rhylei Mcquaig Harris, RN 02/23/2016, 12:46 PM

## 2016-02-23 NOTE — Progress Notes (Signed)
Pt c/o chest discomfort 5/10. V/S stable. EKG taken without significant changes. Morphine 2 mg IV given as PRN dose. Pt is currently chest pain free. Will continue to monitor.

## 2016-02-23 NOTE — Progress Notes (Signed)
Dr. Loney Lohathore with cardiology paged and made aware that patient had a 10 beat run of v-tach. Pt is asymptomatic. NNO. [facility] Barber [employer]

## 2016-02-23 NOTE — Progress Notes (Signed)
TR BAND REMOVAL  LOCATION:    right radial  DEFLATED PER PROTOCOL:    Yes.    TIME BAND OFF / DRESSING APPLIED:    23:30   SITE UPON ARRIVAL:    Level 0  SITE AFTER BAND REMOVAL:    Level 0  CIRCULATION SENSATION AND MOVEMENT:    Within Normal Limits   Yes.    COMMENTS:   Post TR band instructions given. Pt tolerated well. 

## 2016-02-23 NOTE — Progress Notes (Addendum)
Patient Name: Evan ScoreMichael Howze Date of Encounter: 02/23/2016  Principal Problem:   NSTEMI (non-ST elevated myocardial infarction) [facility](HCC) Active Problems:   Dyslipidemia (high LDL; low HDL)   Non-STEMI (non-ST elevated myocardial infarction) [facility](HCC)   Stented coronary artery   Primary Cardiologist: Dr Wyline MoodBranch  Patient Profile: 40 yo male w/ hx tobacco, no previous cardiac issues admitted 04/21 with a NSTEMI.  SUBJECTIVE: Had some mild chest discomfort overnight, none now. Is a truck driver, stick shift. No palpitations, does not feel PVCs.  OBJECTIVE Filed Vitals:   02/23/16 0319 02/23/16 0400 02/23/16 0600 02/23/16 0700  BP: 113/87 119/88 121/91 111/83  Pulse: 89 85 86 92  Temp: 97.2 F (36.2 C)   97.6 F (36.4 C)  TempSrc: Oral   Oral  Resp: 22 19 13 24   Height:      Weight: 164 lb 0.4 oz (74.4 kg)     SpO2: 100% 100% 99% 99%    Intake/Output Summary (Last 24 hours) at 02/23/16 0944 Last data filed at 02/23/16 [id number]  Gross per 24 hour  Intake 881.05 ml  Output   1000 ml  Net -118.95 ml   Filed Weights   02/22/16 0724 02/23/16 0319  Weight: 160 lb (72.576 kg) 164 lb 0.4 oz (74.4 kg)    PHYSICAL EXAM General: Well developed, well nourished, male in no acute distress. Head: Normocephalic, atraumatic.  Neck: Supple without bruits, JVD not elevated. Lungs:  Resp regular and unlabored, few dry rales. Heart: RRR, S1, S2, no S3, S4, or murmur; no rub. Abdomen: Soft, non-tender, non-distended, BS + x 4.  Extremities: No clubbing, cyanosis, edema. R radial cath site without ecchymosis or hematoma Neuro: Alert and oriented X 3. Moves all extremities spontaneously. Psych: Normal affect.  LABS: CBC:  Recent Labs  02/22/16 0738 02/23/16 0418  WBC 8.8 8.2  HGB 16.6 15.7  HCT 47.8 47.1  MCV 90.7 91.1  PLT 201 195   INR:  Recent Labs  02/22/16 0746  INR 1.09   Basic Metabolic Panel:  Recent Labs  82/95/6204/21/17 0738 02/22/16 1252 02/23/16 0418  NA 137  --   138  K 4.2  --  3.7  CL 104  --  106  CO2 25  --  22  GLUCOSE 117*  --  119*  BUN 8  --  7  CREATININE 0.87  --  0.80  CALCIUM 8.8*  --  8.6*  MG  --  2.3  --    Cardiac Enzymes:  Recent Labs  02/22/16 0738 02/22/16 0945  TROPONINI 0.06* 0.27*   Fasting Lipid Panel:  Recent Labs  02/23/16 0418  CHOL 200  HDL 21*  LDLCALC 147*  TRIG 162*  CHOLHDL 9.5   TELE:  SR, PVCs and pairs, NSVT seen, none since 04/21 in the afternoon     Cath: 04/21   Mid Cx lesion, 80% stenosed. Post intervention, there is a 0% residual stenosis.  Dist LAD lesion, 100% stenosed.  Mid LAD lesion, 80% stenosed. Post intervention, there is a 0% residual stenosis. The lesion was previously treated with a stent (unknown type).  There is mild left ventricular systolic dysfunction.  Mid RCA lesion, 30% stenosed. 1. Severe 2 vessel CAD with successful PCI of the LCx and LAD/diagonal 2. Mild segmental LV contraction abnormality with preserved overall LVEF 3. Reisidual occlusion of the apical LAD Recommend ASA/Brilinita x 12 months. Aggrastat x 12 hours. DC home Sunday am with continued observation tomorrow because of heavy thrombus  burden, need for IIbIIIa inhibitor overnight.  Radiology/Studies: Dg Chest 2 View 02/22/2016  CLINICAL DATA:  Chest pain since this morning.  History of smoking. EXAM: CHEST  2 VIEW COMPARISON:  None. FINDINGS: The cardiac silhouette, mediastinal and hilar contours are within normal limits. There are bronchitic changes with peribronchial thickening and increased interstitial markings, likely related to smoking. No infiltrates or effusions. The bony thorax is intact. Slightly irregular appearance of the right humeral head. Could not exclude AVN. Dedicated left shoulder films are recommended. IMPRESSION: No acute cardiopulmonary findings. Bronchitic changes, likely related to smoking. Irregular appearance of the right humeral head could be artifactual but could not exclude AVN.  Recommend dedicated right shoulder films. Electronically Signed   By: Rudie Meyer M.D.   On: 02/22/2016 08:13     Current Medications:  . aspirin EC  81 mg Oral Daily  . atorvastatin  80 mg Oral q1800  . nicotine  21 mg Transdermal Daily  . sodium chloride flush  3 mL Intravenous Q12H  . sodium chloride flush  3 mL Intravenous Q12H  . ticagrelor  90 mg Oral BID   . sodium chloride      ASSESSMENT AND PLAN: Principal Problem:   NSTEMI (non-ST elevated myocardial infarction) (HCC) - s/p 2 v PCI, report above - Agrastat d/c'd at 7 am today - continue ASA, high-dose statin - BP initially soft so not on BB/ACE - will try to add low-dose Coreg - tx telemetry, observe overnight - d/c in am if does well.  Active Problems:   Dyslipidemia (high LDL; low HDL) - on high-dose statin    Tobacco use - cessation strongly encouraged - on nicotine patch  Signed, Lars Masson , PA-C 9:44 AM 02/23/2016  The patient was seen, examined and discussed with Theodore Demark, PA-C and I agree with the above.   40 year old male who presented with non-STEMI with maximum troponin 0.27 who underwent left cardiac cath yesterday to mid circumflex with 80% stenosis and mid LAD with 80% stenosis with no residual post PCI. Patient is doing well however he was in Aggrastat until this morning he had very soft blood pressure on admission.  The patient was running significant reperfusion ventricular tachycardias up to 20 beats, we will start low-dose carvedilol and try to uptitrate as tolerated by blood pressure. He will be on dual antiplatelet therapy with aspirin and ticagrelor. He is advised to stop smoking he'll be started on high dose of atorvastatin we will observe him in telemetry until tomorrow and plan for discharge tomorrow we will arrange for outpatient follow-up.  Lars Masson 02/23/2016

## 2016-02-24 DIAGNOSIS — F17200 Nicotine dependence, unspecified, uncomplicated: Secondary | ICD-10-CM

## 2016-02-24 LAB — CBC
HCT: 47 % (ref 39.0–52.0)
Hemoglobin: 16.1 g/dL (ref 13.0–17.0)
MCH: 31.1 pg (ref 26.0–34.0)
MCHC: 34.3 g/dL (ref 30.0–36.0)
MCV: 90.9 fL (ref 78.0–100.0)
Platelets: 194 10*3/uL (ref 150–400)
RBC: 5.17 MIL/uL (ref 4.22–5.81)
RDW: 13.3 % (ref 11.5–15.5)
WBC: 7.4 10*3/uL (ref 4.0–10.5)

## 2016-02-24 MED ORDER — TICAGRELOR 90 MG PO TABS: 90.0000 mg | ORAL_TABLET | Freq: Two times a day (BID) | ORAL | Status: DC

## 2016-02-24 MED ORDER — ATORVASTATIN CALCIUM 80 MG PO TABS: 80.0000 mg | ORAL_TABLET | Freq: Every day | ORAL | Status: DC

## 2016-02-24 MED ORDER — CARVEDILOL 3.125 MG PO TABS: 3.1250 mg | ORAL_TABLET | Freq: Two times a day (BID) | ORAL | Status: DC

## 2016-02-24 NOTE — Discharge Summary (Signed)
Discharge Summary    Patient ID: Evan Rodriguez,  MRN: [id number], DOB/AGE: [birth year] 42 y.o.  Admit date: 02/22/2016 Discharge date: 02/24/2016  Primary Care Provider: No primary care provider on file. Primary Cardiologist: Dr. Wyline MoodBranch  Discharge Diagnoses    Principal Problem:   NSTEMI (non-ST elevated myocardial infarction) [facility](HCC) Active Problems:   Dyslipidemia (high LDL; low HDL)   Non-STEMI (non-ST elevated myocardial infarction) (HCC)   Stented coronary artery   Allergies No Known Allergies  Diagnostic Studies/Procedures    Procedures    Coronary Stent Intervention   Left Heart Cath and Coronary Angiography    Conclusion     Mid Cx lesion, 80% stenosed. Post intervention, there is a 0% residual stenosis.  Dist LAD lesion, 100% stenosed.  Mid LAD lesion, 80% stenosed. Post intervention, there is a 0% residual stenosis. The lesion was previously treated with a stent (unknown type).  There is mild left ventricular systolic dysfunction.  Mid RCA lesion, 30% stenosed.  1. Severe 2 vessel CAD with successful PCI of the LCx and LAD/diagonal 2. Mild segmental LV contraction abnormality with preserved overall LVEF 3. Reisidual occlusion of the apical LAD       History of Present Illness     [age] yo male with no previous cardiac history [facility]. EKG without acute ischemic changes, however initially troponin 0.06-->0.27. Combined with chest pain symptoms, presentation was consistent with NSTEMI. Subsequently, he was placed on IV heparin, ASA, high dose statin and BB and [facility].    Hospital Course      [facility] Procedure was performed by Dr. Excell Seltzerooper. 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 He underwent successful PCI + DES to both lesions. There was residual occlusion of the apical LAD. EF was overall  preserved. He tolerated the procedure well and left the cath lab in stable condition. He was placed on DAPT with ASA + Brilinta, high intensity statin and BB. He had no recurrent CP but did have significant reperfusion ventricular tachycardias up to 20 beats early post intervention, however this resolved. He had no post cath complications. He ambulated w/o difficulty. HR and BP were stable. He was last seen and examined by Dr. Delton SeeNelson, who determined he was stable for discharge home. Smoking cessation was strongly encouraged. He will f/u with Dr. Wyline MoodBranch.   Consultants: none   Discharge Vitals Blood pressure 116/83, pulse 89, temperature 98.1 F (36.7 C), temperature source Oral, resp. rate 14, height 5\' 6"  (1.676 m), weight 153 lb 3.2 oz (69.491 kg), SpO2 100 %.  Filed Weights   02/22/16 0724 02/23/16 0319 02/24/16 0519  Weight: 160 lb (72.576 kg) 164 lb 0.4 oz (74.4 kg) 153 lb 3.2 oz (69.491 kg)    Labs & Radiologic Studies    CBC  Recent Labs  02/23/16 0418 02/24/16 0552  WBC 8.2 7.4  HGB 15.7 16.1  HCT 47.1 47.0  MCV 91.1 90.9  PLT 195 194   Basic Metabolic Panel  Recent Labs  02/22/16 0738 02/22/16 1252 02/23/16 0418  NA 137  --  138  K 4.2  --  3.7  CL 104  --  106  CO2 25  --  22  GLUCOSE 117*  --  119*  BUN 8  --  7  CREATININE 0.87  --  0.80  CALCIUM  8.8*  --  8.6*  MG  --  2.3  --    Liver Function Tests No results for input(s): AST, ALT, ALKPHOS, BILITOT, PROT, ALBUMIN in the last 72 hours. No results for input(s): LIPASE, AMYLASE in the last 72 hours. Cardiac Enzymes  Recent Labs  02/22/16 0738 02/22/16 0945  TROPONINI 0.06* 0.27*   BNP Invalid input(s): POCBNP D-Dimer No results for input(s): DDIMER in the last 72 hours. Hemoglobin A1C No results for input(s): HGBA1C in the last 72 hours. Fasting Lipid Panel  Recent Labs  02/23/16 0418  CHOL 200  HDL 21*  LDLCALC 147*  TRIG 162*  CHOLHDL 9.5   Thyroid Function Tests No results for  input(s): TSH, T4TOTAL, T3FREE, THYROIDAB in the last 72 hours.  Invalid input(s): FREET3 _____________  Dg Chest 2 View  02/22/2016  CLINICAL DATA:  Chest pain since this morning.  History of smoking. EXAM: CHEST  2 VIEW COMPARISON:  None. FINDINGS: The cardiac silhouette, mediastinal and hilar contours are within normal limits. There are bronchitic changes with peribronchial thickening and increased interstitial markings, likely related to smoking. No infiltrates or effusions. The bony thorax is intact. Slightly irregular appearance of the right humeral head. Could not exclude AVN. Dedicated left shoulder films are recommended. IMPRESSION: No acute cardiopulmonary findings. Bronchitic changes, likely related to smoking. Irregular appearance of the right humeral head could be artifactual but could not exclude AVN. Recommend dedicated right shoulder films. Electronically Signed   By: Rudie Meyer M.D.   On: 02/22/2016 08:13   Disposition   Pt is being discharged home today in good condition.  Follow-up Plans & Appointments    Follow-up Information    Follow up with Dina Rich, MD.   Specialty:  Cardiology   Why:  our office will call you with a follow-up appointment   Contact information:   [address] [place] [address] [phone]      Discharge Instructions    Amb Referral to Cardiac Rehabilitation    Complete by:  As directed   Diagnosis:   NSTEMI Comment - referring to [facility]  pt cannot do because of work schedule  Coronary Stents       Diet - low sodium heart healthy    Complete by:  As directed      Increase activity slowly    Complete by:  As directed            Discharge Medications   Current Discharge Medication List    START taking these medications   Details  atorvastatin (LIPITOR) 80 MG tablet Take 1 tablet (80 mg total) by mouth daily at 6 PM. Qty: 30 tablet, Refills: 5    carvedilol (COREG) 3.125 MG tablet Take 1 tablet (3.125 mg  total) by mouth 2 (two) times daily with a meal. Qty: 60 tablet, Refills: 5    !! ticagrelor (BRILINTA) 90 MG TABS tablet Take 1 tablet (90 mg total) by mouth 2 (two) times daily. Qty: 60 tablet, Refills: 10    !! ticagrelor (BRILINTA) 90 MG TABS tablet Take 1 tablet (90 mg total) by mouth 2 (two) times daily. Qty: 60 tablet, Refills: 0     !! - Potential duplicate medications found. Please discuss with provider.    CONTINUE these medications which have NOT CHANGED   Details  aspirin 81 MG tablet Take 81 mg by mouth daily.         Aspirin prescribed at discharge?  Yes High Intensity Statin  Prescribed? (Lipitor 40-80mg  or Crestor 20-40mg ): Yes Beta Blocker Prescribed? Yes For EF <40%, was ACEI/ARB Prescribed? No: normal EF ADP Receptor Inhibitor Prescribed? (i.e. Plavix etc.-Includes Medically Managed Patients): Yes For EF <40%, Aldosterone Inhibitor Prescribed? No: EF >40% Was EF assessed during THIS hospitalization? Yes Was Cardiac Rehab II ordered? (Included Medically managed Patients): No: will arrange after post hospital visit   Outstanding Labs/Studies   none  Duration of Discharge Encounter   Greater than 30 minutes including physician time.  Signed, SIMMONS, BRITTAINY PA-C 02/24/2016, 2:18 PM

## 2016-02-24 NOTE — Progress Notes (Addendum)
Patient Name: Evan Rodriguez Date of Encounter: 02/24/2016  Principal Problem:   NSTEMI (non-ST elevated myocardial infarction) [facility]) Active Problems:   Dyslipidemia (high LDL; low HDL)   Non-STEMI (non-ST elevated myocardial infarction) [facility])   Stented coronary artery   Primary Cardiologist: Dr Wyline Mood  Patient Profile: 40 yo male w/ hx tobacco, no previous cardiac issues admitted 04/21 with a NSTEMI.  SUBJECTIVE:  No more chest pain or SOB.    OBJECTIVE Filed Vitals:   02/23/16 1940 02/23/16 2105 02/24/16 0519 02/24/16 0826  BP: 105/71 103/73 116/83 116/83  Pulse:  102  89  Temp: 98 F (36.7 C) 98 F (36.7 C) 98.1 F (36.7 C)   TempSrc:  Oral Oral   Resp:   14   Height:      Weight:   153 lb 3.2 oz (69.491 kg)   SpO2:  99% 100%     Intake/Output Summary (Last 24 hours) at 02/24/16 1229 Last data filed at 02/24/16 1000  Gross per 24 hour  Intake   1163 ml  Output   2100 ml  Net   -937 ml   Filed Weights   02/22/16 0724 02/23/16 0319 02/24/16 0519  Weight: 160 lb (72.576 kg) 164 lb 0.4 oz (74.4 kg) 153 lb 3.2 oz (69.491 kg)   PHYSICAL EXAM General: Well developed, well nourished, male in no acute distress. Head: Normocephalic, atraumatic.  Neck: Supple without bruits, JVD not elevated. Lungs:  Resp regular and unlabored, few dry rales. Heart: RRR, S1, S2, no S3, S4, or murmur; no rub. Abdomen: Soft, non-tender, non-distended, BS + x 4.  Extremities: No clubbing, cyanosis, edema. R radial cath site without ecchymosis or hematoma Neuro: Alert and oriented X 3. Moves all extremities spontaneously. Psych: Normal affect.  LABS: CBC:  Recent Labs  02/23/16 0418 02/24/16 0552  WBC 8.2 7.4  HGB 15.7 16.1  HCT 47.1 47.0  MCV 91.1 90.9  PLT 195 194   INR:  Recent Labs  02/22/16 0746  INR 1.09   Basic Metabolic Panel:  Recent Labs  16/10/96 0738 02/22/16 1252 02/23/16 0418  NA 137  --  138  K 4.2  --  3.7  CL 104  --  106  CO2 25  --   22  GLUCOSE 117*  --  119*  BUN 8  --  7  CREATININE 0.87  --  0.80  CALCIUM 8.8*  --  8.6*  MG  --  2.3  --    Cardiac Enzymes:  Recent Labs  02/22/16 0738 02/22/16 0945  TROPONINI 0.06* 0.27*   Fasting Lipid Panel:  Recent Labs  02/23/16 0418  CHOL 200  HDL 21*  LDLCALC 147*  TRIG 162*  CHOLHDL 9.5   TELE:  SR, PVCs and pairs, NSVT seen, none since 04/21 in the afternoon     Cath: 04/21   Mid Cx lesion, 80% stenosed. Post intervention, there is a 0% residual stenosis.  Dist LAD lesion, 100% stenosed.  Mid LAD lesion, 80% stenosed. Post intervention, there is a 0% residual stenosis. The lesion was previously treated with a stent (unknown type).  There is mild left ventricular systolic dysfunction.  Mid RCA lesion, 30% stenosed. 1. Severe 2 vessel CAD with successful PCI of the LCx and LAD/diagonal 2. Mild segmental LV contraction abnormality with preserved overall LVEF 3. Reisidual occlusion of the apical LAD Recommend ASA/Brilinita x 12 months. Aggrastat x 12 hours. DC home Sunday am with continued observation tomorrow because  of heavy thrombus burden, need for IIbIIIa inhibitor overnight.  Radiology/Studies: Dg Chest 2 View 02/22/2016  CLINICAL DATA:  Chest pain since this morning.  History of smoking. EXAM: CHEST  2 VIEW COMPARISON:  None. FINDINGS: The cardiac silhouette, mediastinal and hilar contours are within normal limits. There are bronchitic changes with peribronchial thickening and increased interstitial markings, likely related to smoking. No infiltrates or effusions. The bony thorax is intact. Slightly irregular appearance of the right humeral head. Could not exclude AVN. Dedicated left shoulder films are recommended. IMPRESSION: No acute cardiopulmonary findings. Bronchitic changes, likely related to smoking. Irregular appearance of the right humeral head could be artifactual but could not exclude AVN. Recommend dedicated right shoulder films.  Electronically Signed   By: Rudie MeyerP.  Gallerani M.D.   On: 02/22/2016 08:13   Current Medications:  . aspirin EC  81 mg Oral Daily  . atorvastatin  80 mg Oral q1800  . carvedilol  3.125 mg Oral BID WC  . nicotine  21 mg Transdermal Daily  . sodium chloride flush  3 mL Intravenous Q12H  . ticagrelor  90 mg Oral BID      ASSESSMENT AND PLAN:  Principal Problem:   NSTEMI (non-ST elevated myocardial infarction) (HCC) - s/p 2 v PCI, report above - Agrastat d/c'd at 7 am today - continue ASA, high-dose statin - BP initially soft so not on BB/ACE - will try to add low-dose Coreg - tx telemetry, observe overnight - d/c in am if does well.  Active Problems:   Dyslipidemia (high LDL; low HDL) - on high-dose statin    Tobacco use - cessation strongly encouraged - on nicotine patch  40 year old male who presented with non-STEMI with maximum troponin 0.27 who underwent left cardiac cath on 4/21 to mid circumflex with 80% stenosis and mid LAD with 80% stenosis with no residual post PCI. Patient is doing well however he was onn Aggrastat until morning on 4/22. The patient was running significant reperfusion ventricular tachycardias up to 20 beats early post intervention but they are now resolved. BP now improved, he will be on dual antiplatelet therapy with aspirin and ticagrelor.  Insertion site has no bleeding or bruit. He is advised to stop smoking he'll be started on high dose of atorvastatin, discharge today, we will arrange for outpatient follow-up.  Lars MassonELSON, Montrice Montuori H 02/24/2016

## 2016-02-25 ENCOUNTER — Encounter (HOSPITAL_COMMUNITY): Payer: Self-pay | Admitting: Cardiovascular Disease

## 2016-02-25 MED FILL — Bivalirudin For IV Soln 250 MG: INTRAVENOUS | Qty: 250 | Status: AC

## 2020-11-06 ENCOUNTER — Other Ambulatory Visit: Payer: Self-pay

## 2020-11-06 ENCOUNTER — Ambulatory Visit
Admission: EM | Admit: 2020-11-06 | Discharge: 2020-11-06 | Disposition: A | Discharge status: Home / Self Care | Payer: 59 | Attending: Emergency Medicine | Admitting: Emergency Medicine

## 2020-11-06 DIAGNOSIS — J069 Acute upper respiratory infection, unspecified: Secondary | ICD-10-CM

## 2020-11-06 MED ORDER — METHYLPREDNISOLONE 4 MG PO TBPK: ORAL_TABLET | ORAL | 0 refills | Status: DC

## 2020-11-06 MED ORDER — PREDNISONE 20 MG PO TABS: 40.0000 mg | ORAL_TABLET | Freq: Every day | ORAL | 0 refills | Status: DC

## 2020-11-06 MED ORDER — DM-GUAIFENESIN ER 30-600 MG PO TB12: 1.0000 | ORAL_TABLET | Freq: Two times a day (BID) | ORAL | 0 refills | Status: DC

## 2020-11-06 MED ORDER — BENZONATATE 200 MG PO CAPS: 200.0000 mg | ORAL_CAPSULE | Freq: Three times a day (TID) | ORAL | 0 refills | Status: AC | PRN

## 2020-11-06 MED ORDER — BENZONATATE 200 MG PO CAPS: 200.0000 mg | ORAL_CAPSULE | Freq: Three times a day (TID) | ORAL | 0 refills | Status: DC | PRN

## 2020-11-06 NOTE — ED Triage Notes (Signed)
Pt c/o post nasal drip with a dry cough x2 days, slight headache. States had a positive covid exposure.

## 2020-11-06 NOTE — ED Provider Notes (Signed)
[facility]    CSN: [id number] Arrival date & time: 11/06/20  1314      History   Chief Complaint Chief Complaint  Patient presents with  . Nasal Congestion    HPI Evan Rodriguez is a 45 y.o. male presenting today for evaluation of cough and congestion.  Reports that he has had postnasal drainage, dry cough and headache for approximately 2 days.  Reports positive exposure at home.   HPI  Past Medical History:  Diagnosis Date  . Dyslipidemia (high LDL; low HDL) 02/23/2016  . NSTEMI (non-ST elevated myocardial infarction) ([place]) 02/22/2016    Patient Active Problem List   Diagnosis Date Noted  . Dyslipidemia (high LDL; low HDL) 02/23/2016  . Non-STEMI (non-ST elevated myocardial infarction) ([place])   . Stented coronary artery   . NSTEMI (non-ST elevated myocardial infarction) ([place]) 02/22/2016    Past Surgical History:  Procedure Laterality Date  . CARDIAC CATHETERIZATION N/A 02/22/2016   Procedure: Left Heart Cath and Coronary Angiography;  Surgeon: Sherren Mocha, MD;  Location: [place] [facility];  Service: Cardiovascular;  Laterality: N/A;  . CARDIAC CATHETERIZATION N/A 02/22/2016   Procedure: Coronary Stent Intervention;  Surgeon: Sherren Mocha, MD;  Location: [place] [facility];  Service: Cardiovascular;  Laterality: N/A;  . Wrist repairr         Home Medications    Prior to Admission medications   Medication Sig Start Date End Date Taking? Authorizing Provider  aspirin 81 MG tablet Take 81 mg by mouth daily.    [provider]  benzonatate (TESSALON) 200 MG capsule Take 1 capsule (200 mg total) by mouth 3 (three) times daily as needed for up to 7 days for cough. 11/06/20 11/13/20  Macrae Wiegman C, PA-C  dextromethorphan-guaiFENesin (MUCINEX DM) 30-600 MG 12hr tablet Take 1 tablet by mouth 2 (two) times daily. 11/06/20   Saanvika Vazques C, PA-C  methylPREDNISolone (MEDROL DOSEPAK) 4 MG TBPK tablet Take as directed 11/06/20   Chaynce Schafer, Elesa Hacker, PA-C     Family History Family History  Problem Relation Age of Onset  . Hypertension Maternal Grandfather     Social History Social History   Tobacco Use  . Smoking status: Current Every Day Smoker    Packs/day: 1.50    Years: 20.00    Pack years: 30.00    Types: Cigarettes  . Smokeless tobacco: Never Used  Substance Use Topics  . Alcohol use: Yes    Alcohol/week: 0.0 standard drinks  . Drug use: No     Allergies   Patient has no known allergies.   Review of Systems Review of Systems  Constitutional: Negative for activity change, appetite change, chills, fatigue and fever.  HENT: Positive for congestion and rhinorrhea. Negative for ear pain, sinus pressure, sore throat and trouble swallowing.   Eyes: Negative for discharge and redness.  Respiratory: Positive for cough. Negative for chest tightness and shortness of breath.   Cardiovascular: Negative for chest pain.  Gastrointestinal: Negative for abdominal pain, diarrhea, nausea and vomiting.  Musculoskeletal: Negative for myalgias.  Skin: Negative for rash.  Neurological: Negative for dizziness, light-headedness and headaches.     Physical Exam Triage Vital Signs ED Triage Vitals [11/06/20 1615]  Enc Vitals Group     BP 118/65     Pulse Rate (!) 114     Resp 18     Temp 100.2 F (37.9 C)     Temp Source Oral     SpO2 96 %  Weight      Height      Head Circumference      Peak Flow      Pain Score 0     Pain Loc      Pain Edu?      Excl. in GC?    No data found.  Updated Vital Signs BP 118/65 (BP Location: Left Arm)   Pulse (!) 114   Temp 100.2 F (37.9 C) (Oral)   Resp 18   SpO2 96%   Visual Acuity Right Eye Distance:   Left Eye Distance:   Bilateral Distance:    Right Eye Near:   Left Eye Near:    Bilateral Near:     Physical Exam Vitals and nursing note reviewed.  Constitutional:      Appearance: He is well-developed and well-nourished.     Comments: No acute distress  HENT:      Head: Normocephalic and atraumatic.     Ears:     Comments: Bilateral ears without tenderness to palpation of external auricle, tragus and mastoid, EAC's without erythema or swelling, TM's with good bony landmarks and cone of light. Non erythematous.     Nose: Nose normal.     Mouth/Throat:     Comments: Oral mucosa pink and moist, no tonsillar enlargement or exudate. Posterior pharynx patent and nonerythematous, no uvula deviation or swelling. Normal phonation. Eyes:     Conjunctiva/sclera: Conjunctivae normal.  Cardiovascular:     Rate and Rhythm: Normal rate.  Pulmonary:     Effort: Pulmonary effort is normal. No respiratory distress.     Comments: Breathing comfortably at rest, mild expiratory wheezing in coarseness noted to bilateral lower lung fields Abdominal:     General: There is no distension.  Musculoskeletal:        General: Normal range of motion.     Cervical back: Neck supple.  Skin:    General: Skin is warm and dry.  Neurological:     Mental Status: He is alert and oriented to person, place, and time.  Psychiatric:        Mood and Affect: Mood and affect normal.      UC Treatments / Results  Labs (all labs ordered are listed, but only abnormal results are displayed) Labs Reviewed  NOVEL CORONAVIRUS, NAA    EKG   Radiology No results found.  Procedures Procedures (including critical care time)  Medications Ordered in UC Medications - No data to display  Initial Impression / Assessment and Plan / UC Course  I have reviewed the triage vital signs and the nursing notes.  Pertinent labs & imaging results that were available during my care of the patient were reviewed by me and considered in my medical decision making (see chart for details).     Viral URI with cough-Covid test pending, patient hesitant regarding Covid testing, advised in order to clear for work related to screen for Covid given exposure at home.  Recommending symptomatic and  supportive care rest and fluids.  Providing course of steroids for wheezing/bronchitis, switch prednisone to Medrol Dosepak.  Discussed strict return precautions. Patient verbalized understanding and is agreeable with plan.  Final Clinical Impressions(s) / UC Diagnoses   Final diagnoses:  Viral URI with cough     Discharge Instructions     Covid test pending, monitor my chart for results Rest and fluids Tylenol and ibuprofen as needed Prednisone daily for the next 5 days to help with chest inflammation and congestion Tessalon  for cough every 8 hours Mucinex DM to further relieve cough/congestion Follow-up if not improving or worsening    ED Prescriptions    Medication Sig Dispense Auth. Provider   benzonatate (TESSALON) 200 MG capsule  (Status: Discontinued) Take 1 capsule (200 mg total) by mouth 3 (three) times daily as needed for up to 7 days for cough. 28 capsule Ellah Otte C, PA-C   predniSONE (DELTASONE) 20 MG tablet  (Status: Discontinued) Take 2 tablets (40 mg total) by mouth daily for 5 days. 10 tablet Aslan Himes C, PA-C   dextromethorphan-guaiFENesin (MUCINEX DM) 30-600 MG 12hr tablet  (Status: Discontinued) Take 1 tablet by mouth 2 (two) times daily. 20 tablet Graylon Amory C, PA-C   methylPREDNISolone (MEDROL DOSEPAK) 4 MG TBPK tablet  (Status: Discontinued) Take as directed 21 tablet Lauralei Clouse C, PA-C   benzonatate (TESSALON) 200 MG capsule Take 1 capsule (200 mg total) by mouth 3 (three) times daily as needed for up to 7 days for cough. 28 capsule Callen Vancuren C, PA-C   dextromethorphan-guaiFENesin (MUCINEX DM) 30-600 MG 12hr tablet Take 1 tablet by mouth 2 (two) times daily. 20 tablet Davyn Elsasser C, PA-C   methylPREDNISolone (MEDROL DOSEPAK) 4 MG TBPK tablet Take as directed 21 tablet Elyanna Wallick C, PA-C     PDMP not reviewed this encounter.   Lew Dawes, [place] 11/06/20 1717

## 2020-11-06 NOTE — Discharge Instructions (Addendum)
Covid test pending, monitor my chart for results Rest and fluids Tylenol and ibuprofen as needed Prednisone daily for the next 5 days to help with chest inflammation and congestion Tessalon for cough every 8 hours Mucinex DM to further relieve cough/congestion Follow-up if not improving or worsening

## 2020-11-09 LAB — NOVEL CORONAVIRUS, NAA: SARS-CoV-2, NAA: DETECTED — AB

## 2020-12-02 ENCOUNTER — Other Ambulatory Visit: Payer: Self-pay

## 2020-12-02 ENCOUNTER — Ambulatory Visit
Admission: EM | Admit: 2020-12-02 | Discharge: 2020-12-02 | Disposition: A | Discharge status: Home / Self Care | Payer: 59

## 2020-12-02 ENCOUNTER — Encounter: Payer: Self-pay | Admitting: *Deleted

## 2020-12-02 DIAGNOSIS — L03032 Cellulitis of left toe: Secondary | ICD-10-CM

## 2020-12-02 HISTORY — DX: COVID-19: U07.1

## 2020-12-02 MED ORDER — NAPROXEN 500 MG PO TABS: 500.0000 mg | ORAL_TABLET | Freq: Two times a day (BID) | ORAL | 0 refills | Status: DC

## 2020-12-02 MED ORDER — AMOXICILLIN-POT CLAVULANATE 875-125 MG PO TABS: 1.0000 | ORAL_TABLET | Freq: Two times a day (BID) | ORAL | 0 refills | Status: DC

## 2020-12-02 NOTE — Discharge Instructions (Signed)
Keep area(s) clean and dry. Take antibiotic as prescribed with food - important to complete course. Return for worsening pain, redness, swelling, discharge, fever. 

## 2020-12-02 NOTE — ED Triage Notes (Signed)
Pt tested + for Covid 11/06/20; states "treated like a sinus infection and it went away". C/O intermittent left foot swelling x 3 wks; states area btwn left 4th & 5th toes "busted open". C/O "shock-like" pain when bearing weight on LLE.  Denies any leg pain or swelling at any time.

## 2020-12-02 NOTE — ED Provider Notes (Signed)
[facility]    CSN: [id number] Arrival date & time: 12/02/20  1211      History   Chief Complaint Chief Complaint  Patient presents with  . Cellulitis    HPI Evan Rodriguez is a 45 y.o. male  With history as below presenting for intermittent left foot pain, swelling, rash. States that he noticed it about 3 weeks ago. Swelling was initially waxing, waning, worse with weightbearing. States since then the area between his fourth and fifth toes "busted ". Has had worsening pain and redness since. Denies injury, trauma.  Past Medical History:  Diagnosis Date  . COVID-19   . NSTEMI (non-ST elevated myocardial infarction) ([facility]) 02/22/2016    Patient Active Problem List   Diagnosis Date Noted  . Dyslipidemia (high LDL; low HDL) 02/23/2016  . Non-STEMI (non-ST elevated myocardial infarction) ([facility])   . Stented coronary artery   . NSTEMI (non-ST elevated myocardial infarction) ([facility]) 02/22/2016    Past Surgical History:  Procedure Laterality Date  . CARDIAC CATHETERIZATION N/A 02/22/2016   Procedure: Left Heart Cath and Coronary Angiography;  Surgeon: Tonny Bollman, MD;  Location: [facility];  Service: Cardiovascular;  Laterality: N/A;  . CARDIAC CATHETERIZATION N/A 02/22/2016   Procedure: Coronary Stent Intervention;  Surgeon: Tonny Bollman, MD;  Location: [facility];  Service: Cardiovascular;  Laterality: N/A;  . PERCUTANEOUS CORONARY STENT INTERVENTION (PCI-S)    . WRIST SURGERY         Home Medications    Prior to Admission medications   Medication Sig Start Date End Date Taking? Authorizing Provider  Acetaminophen (TYLENOL PO) Take by mouth.   Yes [provider]  amoxicillin-clavulanate (AUGMENTIN) 875-125 MG tablet Take 1 tablet by mouth every 12 (twelve) hours. 12/02/20  Yes Hall-Potvin, [place], PA-C  aspirin 81 MG tablet Take 81 mg by mouth daily.   Yes [provider]  Ibuprofen (ADVIL PO) Take by mouth.   Yes  [provider]  naproxen (NAPROSYN) 500 MG tablet Take 1 tablet (500 mg total) by mouth 2 (two) times daily. 12/02/20  Yes Hall-Potvin, [place], PA-C    Family History Family History  Problem Relation Age of Onset  . Hypertension Maternal Grandfather     Social History Social History   Tobacco Use  . Smoking status: Current Every Day Smoker    Packs/day: 1.50    Years: 20.00    Pack years: 30.00    Types: Cigarettes  . Smokeless tobacco: Current User    Types: Chew  Vaping Use  . Vaping Use: Some days  Substance Use Topics  . Alcohol use: Yes    Comment: occasionally  . Drug use: No     Allergies   Patient has no known allergies.   Review of Systems Review of Systems  Constitutional: Negative for fatigue and fever.  Respiratory: Negative for cough and shortness of breath.   Cardiovascular: Negative for chest pain and palpitations.  Gastrointestinal: Negative for abdominal pain, diarrhea and vomiting.  Musculoskeletal: Negative for arthralgias and myalgias.  Skin: Positive for rash and wound.  Neurological: Negative for speech difficulty and headaches.  All other systems reviewed and are negative.    Physical Exam Triage Vital Signs ED Triage Vitals  Enc Vitals Group     BP      Pulse      Resp      Temp      Temp src      SpO2  Weight      Height      Head Circumference      Peak Flow      Pain Score      Pain Loc      Pain Edu?      Excl. in GC?    No data found.  Updated Vital Signs BP 110/71   Pulse (!) 106   Temp 98.1 F (36.7 C) (Oral)   Resp 18   SpO2 96%   Visual Acuity Right Eye Distance:   Left Eye Distance:   Bilateral Distance:    Right Eye Near:   Left Eye Near:    Bilateral Near:     Physical Exam Constitutional:      General: He is not in acute distress. HENT:     Head: Normocephalic and atraumatic.  Eyes:     General: No scleral icterus.    Pupils: Pupils are equal, round, and reactive to light.   Cardiovascular:     Rate and Rhythm: Normal rate.  Pulmonary:     Effort: Pulmonary effort is normal. No respiratory distress.     Breath sounds: No wheezing.  Musculoskeletal:        General: Swelling and tenderness present. No deformity. Normal range of motion.     Comments: Left foot, third and fourth digits with erythema, swelling, warmth and tenderness.  NVI  Skin:    General: Skin is warm.     Capillary Refill: Capillary refill takes less than 2 seconds.     Coloration: Skin is not jaundiced or pale.     Findings: Erythema present.  Neurological:     General: No focal deficit present.     Mental Status: He is alert and oriented to person, place, and time.      UC Treatments / Results  Labs (all labs ordered are listed, but only abnormal results are displayed) Labs Reviewed - No data to display  EKG   Radiology No results found.  Procedures Procedures (including critical care time)  Medications Ordered in UC Medications - No data to display  Initial Impression / Assessment and Plan / UC Course  I have reviewed the triage vital signs and the nursing notes.  Pertinent labs & imaging results that were available during my care of the patient were reviewed by me and considered in my medical decision making (see chart for details).     H&P concerning for cellulitis: We'll treat supportively as below.  Return precautions discussed, pt verbalized understanding and is agreeable to plan. Final Clinical Impressions(s) / UC Diagnoses   Final diagnoses:  Cellulitis of toe of left foot     Discharge Instructions     Keep area(s) clean and dry. Take antibiotic as prescribed with food - important to complete course. Return for worsening pain, redness, swelling, discharge, fever.    ED Prescriptions    Medication Sig Dispense Auth. Provider   amoxicillin-clavulanate (AUGMENTIN) 875-125 MG tablet Take 1 tablet by mouth every 12 (twelve) hours. 14 tablet Hall-Potvin,  [place], PA-C   naproxen (NAPROSYN) 500 MG tablet Take 1 tablet (500 mg total) by mouth 2 (two) times daily. 30 tablet Hall-Potvin, [place], PA-C     PDMP not reviewed this encounter.   Hall-Potvin, [place], [place] 12/02/20 1331

## 2021-01-05 ENCOUNTER — Other Ambulatory Visit: Payer: Self-pay

## 2021-01-05 ENCOUNTER — Emergency Department (HOSPITAL_COMMUNITY)
Admission: EM | Admit: 2021-01-05 | Discharge: 2021-01-06 | Disposition: A | Discharge status: Home / Self Care | Payer: 59 | Attending: Emergency Medicine | Admitting: Emergency Medicine

## 2021-01-05 ENCOUNTER — Encounter (HOSPITAL_COMMUNITY): Payer: Self-pay

## 2021-01-05 ENCOUNTER — Emergency Department (HOSPITAL_COMMUNITY): Payer: 59

## 2021-01-05 DIAGNOSIS — U071 COVID-19: Secondary | ICD-10-CM | POA: Insufficient documentation

## 2021-01-05 DIAGNOSIS — Z955 Presence of coronary angioplasty implant and graft: Secondary | ICD-10-CM | POA: Insufficient documentation

## 2021-01-05 DIAGNOSIS — B353 Tinea pedis: Secondary | ICD-10-CM | POA: Diagnosis present

## 2021-01-05 DIAGNOSIS — F17210 Nicotine dependence, cigarettes, uncomplicated: Secondary | ICD-10-CM | POA: Insufficient documentation

## 2021-01-05 DIAGNOSIS — B351 Tinea unguium: Secondary | ICD-10-CM | POA: Diagnosis present

## 2021-01-05 DIAGNOSIS — Z7982 Long term (current) use of aspirin: Secondary | ICD-10-CM | POA: Diagnosis not present

## 2021-01-05 DIAGNOSIS — M79672 Pain in left foot: Secondary | ICD-10-CM | POA: Diagnosis present

## 2021-01-05 DIAGNOSIS — R238 Other skin changes: Secondary | ICD-10-CM | POA: Diagnosis present

## 2021-01-05 HISTORY — DX: Tinea pedis: B35.3

## 2021-01-05 LAB — COMPREHENSIVE METABOLIC PANEL
ALT: 25 U/L (ref 0–44)
AST: 22 U/L (ref 15–41)
Albumin: 3.7 g/dL (ref 3.5–5.0)
Alkaline Phosphatase: 56 U/L (ref 38–126)
Anion gap: 6 (ref 5–15)
BUN: 13 mg/dL (ref 6–20)
CO2: 24 mmol/L (ref 22–32)
Calcium: 9.2 mg/dL (ref 8.9–10.3)
Chloride: 108 mmol/L (ref 98–111)
Creatinine, Ser: 0.82 mg/dL (ref 0.61–1.24)
GFR, Estimated: >60 mL/min (ref >60)
Glucose, Bld: 123 mg/dL — ABNORMAL HIGH (ref 70–99)
Potassium: 3.4 mmol/L — ABNORMAL LOW (ref 3.5–5.1)
Sodium: 138 mmol/L (ref 135–145)
Total Bilirubin: 0.7 mg/dL (ref 0.3–1.2)
Total Protein: 7.1 g/dL (ref 6.5–8.1)

## 2021-01-05 LAB — CBC WITH DIFFERENTIAL/PLATELET
Abs Immature Granulocytes: 0.02 10*3/uL (ref 0.00–0.07)
Basophils Absolute: 0.1 10*3/uL (ref 0.0–0.1)
Basophils Relative: 1 %
Eosinophils Absolute: 0.3 10*3/uL (ref 0.0–0.5)
Eosinophils Relative: 4 %
HCT: 45.3 % (ref 39.0–52.0)
Hemoglobin: 15.2 g/dL (ref 13.0–17.0)
Immature Granulocytes: 0 %
Lymphocytes Relative: 36 %
Lymphs Abs: 3.3 10*3/uL (ref 0.7–4.0)
MCH: 31.3 pg (ref 26.0–34.0)
MCHC: 33.6 g/dL (ref 30.0–36.0)
MCV: 93.2 fL (ref 80.0–100.0)
Monocytes Absolute: 0.8 10*3/uL (ref 0.1–1.0)
Monocytes Relative: 8 %
Neutro Abs: 4.7 10*3/uL (ref 1.7–7.7)
Neutrophils Relative %: 51 %
Platelets: 227 10*3/uL (ref 150–400)
RBC: 4.86 MIL/uL (ref 4.22–5.81)
RDW: 15.7 % — ABNORMAL HIGH (ref 11.5–15.5)
WBC: 9.3 10*3/uL (ref 4.0–10.5)
nRBC: 0 % (ref 0.0–0.2)

## 2021-01-05 MED ORDER — OXYCODONE HCL 5 MG PO TABS
2.5000 mg | ORAL_TABLET | Freq: Once | ORAL | Status: AC
  Administered 2021-01-06: 2.5 mg via ORAL
  Filled 2021-01-05: qty 1

## 2021-01-05 MED ORDER — SULFAMETHOXAZOLE-TRIMETHOPRIM 800-160 MG PO TABS
1.0000 | ORAL_TABLET | Freq: Once | ORAL | Status: AC
  Administered 2021-01-06: 1 via ORAL
  Filled 2021-01-05: qty 1

## 2021-01-05 MED ORDER — ACETAMINOPHEN 325 MG PO TABS
650.0000 mg | ORAL_TABLET | Freq: Once | ORAL | Status: AC
  Administered 2021-01-06: 650 mg via ORAL
  Filled 2021-01-05: qty 2

## 2021-01-05 NOTE — ED Provider Notes (Signed)
[facility]-EMERGENCY DEPT Provider Note   CSN: [id number] Arrival date & time: 01/05/21  2047     History Chief Complaint  Patient presents with  . Foot Pain    Evan Rodriguez is a 45 y.o. male.  Patient reports to the emergency department today with concern for 2 months of infection of toes on left foot.  The patient was brought in by his daughter as he is hesitant to be seen by doctors and does not have a primary care provider.  Patient reports many weeks of pain and swelling in the toes of his left foot.  He reports they have been red and swollen and have looked infected.  Reports that the second, fourth, and fifth toes "burst" and drained purulent fluid.  Since then the second and fifth toes have improved; however, patient reports that the nail on his first toe has started to fall off and he continues to have pain and an ulcer in his medial fourth digit.  Patient was seen at urgent care clinic December 02, 2020.  Patient was told he had cellulitis of his toes, was prescribed Augmentin and Naprosyn.  He was asked to follow-up as needed.  Patient reports that since then he has continued to have severe pain and swelling of his toes, especially the first and fourth digit.  Patient reports elevating his foot sometimes makes the pain worse.  Has occasionally been applying a cold compress to the feet to help reduce his pain.  Concerningly, he reports that he has been taking a lot of ibuprofen, reports he took 300 tablets of ibuprofen in a week or 2.  Upon chart review, it was noted the patient had COVID January 4.  The patient and his daughter report that they got Covid from a family member around Christmas time.  Patient reports that shortly after he was diagnosed with COVID he started having the swelling and pain in his feet/toes.      Past Medical History:  Diagnosis Date  . COVID-19   . NSTEMI (non-ST elevated myocardial infarction) ([facility]) 02/22/2016    Patient  Active Problem List   Diagnosis Date Noted  . COVID toes 01/05/2021  . Fungal toenail infection 01/05/2021  . Dyslipidemia (high LDL; low HDL) 02/23/2016  . Non-STEMI (non-ST elevated myocardial infarction) ([facility])   . Stented coronary artery   . NSTEMI (non-ST elevated myocardial infarction) ([facility]) 02/22/2016    Past Surgical History:  Procedure Laterality Date  . CARDIAC CATHETERIZATION N/A 02/22/2016   Procedure: Left Heart Cath and Coronary Angiography;  Surgeon: Tonny Bollman, MD;  Location: [facility] INVASIVE [facility];  Service: Cardiovascular;  Laterality: N/A;  . CARDIAC CATHETERIZATION N/A 02/22/2016   Procedure: Coronary Stent Intervention;  Surgeon: Tonny Bollman, MD;  Location: [facility] INVASIVE [facility];  Service: Cardiovascular;  Laterality: N/A;  . PERCUTANEOUS CORONARY STENT INTERVENTION (PCI-S)    . WRIST SURGERY        Family History  Problem Relation Age of Onset  . Hypertension Maternal Grandfather     Social History   Tobacco Use  . Smoking status: Current Every Day Smoker    Packs/day: 1.50    Years: 20.00    Pack years: 30.00    Types: Cigarettes  . Smokeless tobacco: Current User    Types: Chew  Vaping Use  . Vaping Use: Some days  Substance Use Topics  . Alcohol use: Yes    Comment: occasionally  . Drug use: No  -Patient reports he is  currently smoking 2 packs daily; he has been smoking since he was 45 years old (total of 28 years)  Home Medications Prior to Admission medications   Medication Sig Start Date End Date Taking? Authorizing Provider  oxyCODONE-acetaminophen (PERCOCET) 5-325 MG tablet Take 1 tablet by mouth 3 (three) times daily as needed for up to 5 days for severe pain. 01/06/21 01/11/21 Yes Peggyann Shoals C, DO  sulfamethoxazole-trimethoprim (BACTRIM DS) 800-160 MG tablet Take 1 tablet by mouth 2 (two) times daily for 10 days. 01/06/21 01/16/21 Yes Peggyann Shoals C, DO  Acetaminophen (TYLENOL PO) Take by mouth.    [provider]  aspirin 81  MG tablet Take 81 mg by mouth daily.    [provider]  Ibuprofen (ADVIL PO) Take by mouth.    [provider]    Allergies    Patient has no known allergies.  Review of Systems   Review of Systems  Constitutional: Positive for chills and fatigue (Patient reports he is tired because he is not sleeping at night due to pain in his feet). Negative for fever.  HENT: Negative.   Respiratory: Negative for cough and shortness of breath.   Cardiovascular: Negative.   Gastrointestinal: Negative.   Musculoskeletal: Positive for arthralgias (Toes of left foot) and joint swelling (Toes of left foot).  Neurological: Negative.     Physical Exam Updated Vital Signs BP 125/81 (BP Location: Left Arm)   Pulse 88   Temp 97.6 F (36.4 C) (Oral)   Resp 17   Ht 5\' 6"  (1.676 m)   Wt 74.8 kg   SpO2 95%   BMI 26.63 kg/m   Physical Exam Constitutional:      Appearance: Normal appearance. He is not toxic-appearing.  HENT:     Mouth/Throat:     Mouth: Mucous membranes are moist.  Eyes:     Conjunctiva/sclera: Conjunctivae normal.  Cardiovascular:     Rate and Rhythm: Normal rate and regular rhythm.     Pulses: Normal pulses.     Heart sounds: Normal heart sounds.  Pulmonary:     Effort: Pulmonary effort is normal.     Breath sounds: Normal breath sounds.  Abdominal:     General: Bowel sounds are normal.  Musculoskeletal:        General: Swelling (1st and 4th digits of L foot) and tenderness (1st and 4th digits of L foot) present.     Cervical back: Normal range of motion.  Lymphadenopathy:     Cervical: No cervical adenopathy.  Skin:    Comments: Warm to Right foot; cool to left foot  Neurological:     Mental Status: He is alert.         ED Results / Procedures / Treatments   Labs (all labs ordered are listed, but only abnormal results are displayed) Labs Reviewed  CBC WITH DIFFERENTIAL/PLATELET - Abnormal; Notable for the following components:      Result  Value   RDW 15.7 (*)    All other components within normal limits  COMPREHENSIVE METABOLIC PANEL - Abnormal; Notable for the following components:   Potassium 3.4 (*)    Glucose, Bld 123 (*)    All other components within normal limits   EKG None  Radiology DG Foot 2 Views Left  Result Date: 01/05/2021 CLINICAL DATA:  Left great toe swelling and redness. Symptoms for 2 months. EXAM: LEFT FOOT - 2 VIEW COMPARISON:  None. FINDINGS: There is no evidence of fracture or dislocation. No erosion,  periosteal reaction, or bony destruction small Achilles tendon enthesophyte. Soft tissues are unremarkable. No soft tissue air or radiopaque foreign body. IMPRESSION: 1. No explanation for toe swelling and redness. 2. Small plantar calcaneal spur. Otherwise unremarkable radiographs of the left foot. Electronically Signed   By: Narda Rutherford M.D.   On: 01/05/2021 23:07   Procedures Procedures - none  Medications Ordered in ED Medications  oxyCODONE (Oxy IR/ROXICODONE) immediate release tablet 2.5 mg (has no administration in time range)  acetaminophen (TYLENOL) tablet 650 mg (has no administration in time range)  sulfamethoxazole-trimethoprim (BACTRIM DS) 800-160 MG per tablet 1 tablet (has no administration in time range)    ED Course  I have reviewed the triage vital signs and the nursing notes.  Pertinent labs & imaging results that were available during my care of the patient were reviewed by me and considered in my medical decision making (see chart for details).  Toe wounds of left foot: Patient with toe wounds that developed at the same time/shortly after he was diagnosed with COVID-19.  Wounds could be secondary to "Covid toes" with underlying fungal component. Also consider bacterial cellulitis. CBC normal. Vitals stable, patient afebrile. - Treat with Bactrim BID x10 days - Have patient follow up in clinic with me for ABIs, removal of toe nail on 01/21/21 at 2:30pm - Percocet 5mg  2-3x  daily for severe pain.  - Strict return precautions given    MDM Rules/Calculators/A&P                           Final Clinical Impression(s) / ED Diagnoses Final diagnoses:  COVID toes  Fungal toenail infection    Rx / DC Orders ED Discharge Orders         Ordered    sulfamethoxazole-trimethoprim (BACTRIM DS) 800-160 MG tablet  2 times daily        01/06/21 0015    oxyCODONE-acetaminophen (PERCOCET) 5-325 MG tablet  3 times daily PRN        01/06/21 0015           03/08/21, DO 01/06/21 0019    03/08/21, MD 01/06/21 0021

## 2021-01-05 NOTE — ED Triage Notes (Signed)
Pt reports right foot swelling x3 weeks. Pt states he went to UC and the doctor looked at his foot and told him it was cellulitis and discharged him.

## 2021-01-05 NOTE — Discharge Instructions (Addendum)
You are showing signs of both a bacterial infection with underlying fungal infection.   We are prescribing you a different antibiotic called Bactrim. Take this two (2) times daily with a full glass of water until all of the medication is gone.  -I am giving you some Percocet for pain. Only take 1 tablet 1-2 times daily. 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 This medication can make you very sleepy.  -STOP TAKING IBUPROFEN and ADVIL and ALEVE! These medications can cause you to thin the lining of your stomach and cause severe, potentially life-threatening stomach bleed.  -Avoid smoking while you are healing - smoking makes your blood vessels narrow and decreases blood flow to the healing area. This can prolong your infection and prevent it from being treated. -Soak your left foot in a warm Epsom salt bath 1-2 times daily to help improve circulation and reduce infection -Please follow up with me in clinic for removal of the toe nail and re-evaluation of your infection. We can consider starting you on a medication to treat your fungal infection.   You have a follow up appointment  Monday, March 21st at 2:30pm scheduled with  Dr. Peggyann Shoals at the  [facility] [address] [place], [place] [address]  (clinic located right in front of [facility]) Should you have any issues please call me/leave me a message at [phone]. Also, sign up for MyChart so you can send me MyChart messages and view lab results.

## 2021-01-06 MED ORDER — SULFAMETHOXAZOLE-TRIMETHOPRIM 800-160 MG PO TABS: 1.0000 | ORAL_TABLET | Freq: Two times a day (BID) | ORAL | 0 refills | Status: AC

## 2021-01-06 MED ORDER — OXYCODONE-ACETAMINOPHEN 5-325 MG PO TABS: 1.0000 | ORAL_TABLET | Freq: Three times a day (TID) | ORAL | 0 refills | Status: AC | PRN

## 2021-01-21 ENCOUNTER — Emergency Department (HOSPITAL_COMMUNITY): Payer: 59

## 2021-01-21 ENCOUNTER — Other Ambulatory Visit: Payer: Self-pay

## 2021-01-21 ENCOUNTER — Observation Stay (HOSPITAL_COMMUNITY)
Admission: EM | Admit: 2021-01-21 | Discharge: 2021-01-24 | Disposition: A | Discharge status: Home / Self Care | Payer: 59 | Attending: Family Medicine | Admitting: Family Medicine

## 2021-01-21 ENCOUNTER — Encounter: Payer: Self-pay | Admitting: Family Medicine

## 2021-01-21 ENCOUNTER — Ambulatory Visit (INDEPENDENT_AMBULATORY_CARE_PROVIDER_SITE_OTHER): Payer: 59 | Admitting: Family Medicine

## 2021-01-21 ENCOUNTER — Encounter (HOSPITAL_COMMUNITY): Payer: Self-pay | Admitting: Emergency Medicine

## 2021-01-21 ENCOUNTER — Ambulatory Visit: Payer: 59 | Admitting: Family Medicine

## 2021-01-21 ENCOUNTER — Telehealth: Payer: Self-pay | Admitting: Family Medicine

## 2021-01-21 VITALS — BP 114/83 | HR 117 | Ht 66.0 in | Wt 164.8 lb

## 2021-01-21 DIAGNOSIS — I252 Old myocardial infarction: Secondary | ICD-10-CM | POA: Diagnosis not present

## 2021-01-21 DIAGNOSIS — I70229 Atherosclerosis of native arteries of extremities with rest pain, unspecified extremity: Secondary | ICD-10-CM

## 2021-01-21 DIAGNOSIS — R700 Elevated erythrocyte sedimentation rate: Secondary | ICD-10-CM

## 2021-01-21 DIAGNOSIS — M62262 Nontraumatic ischemic infarction of muscle, left lower leg: Secondary | ICD-10-CM | POA: Diagnosis not present

## 2021-01-21 DIAGNOSIS — B353 Tinea pedis: Secondary | ICD-10-CM | POA: Diagnosis present

## 2021-01-21 DIAGNOSIS — E876 Hypokalemia: Secondary | ICD-10-CM | POA: Diagnosis present

## 2021-01-21 DIAGNOSIS — F17200 Nicotine dependence, unspecified, uncomplicated: Secondary | ICD-10-CM | POA: Diagnosis present

## 2021-01-21 DIAGNOSIS — L988 Other specified disorders of the skin and subcutaneous tissue: Secondary | ICD-10-CM

## 2021-01-21 DIAGNOSIS — I959 Hypotension, unspecified: Secondary | ICD-10-CM | POA: Clinically undetermined

## 2021-01-21 DIAGNOSIS — L089 Local infection of the skin and subcutaneous tissue, unspecified: Secondary | ICD-10-CM | POA: Diagnosis not present

## 2021-01-21 DIAGNOSIS — U071 COVID-19: Secondary | ICD-10-CM | POA: Diagnosis present

## 2021-01-21 DIAGNOSIS — E785 Hyperlipidemia, unspecified: Secondary | ICD-10-CM | POA: Insufficient documentation

## 2021-01-21 DIAGNOSIS — F17210 Nicotine dependence, cigarettes, uncomplicated: Secondary | ICD-10-CM | POA: Diagnosis not present

## 2021-01-21 DIAGNOSIS — Z8616 Personal history of COVID-19: Secondary | ICD-10-CM | POA: Insufficient documentation

## 2021-01-21 DIAGNOSIS — Z7982 Long term (current) use of aspirin: Secondary | ICD-10-CM | POA: Insufficient documentation

## 2021-01-21 DIAGNOSIS — Z955 Presence of coronary angioplasty implant and graft: Secondary | ICD-10-CM | POA: Diagnosis not present

## 2021-01-21 DIAGNOSIS — R238 Other skin changes: Secondary | ICD-10-CM | POA: Diagnosis present

## 2021-01-21 DIAGNOSIS — L97529 Non-pressure chronic ulcer of other part of left foot with unspecified severity: Secondary | ICD-10-CM

## 2021-01-21 DIAGNOSIS — M79675 Pain in left toe(s): Secondary | ICD-10-CM | POA: Diagnosis present

## 2021-01-21 DIAGNOSIS — F17211 Nicotine dependence, cigarettes, in remission: Secondary | ICD-10-CM | POA: Diagnosis present

## 2021-01-21 HISTORY — DX: Presence of coronary angioplasty implant and graft: Z95.5

## 2021-01-21 MED ORDER — OXYCODONE-ACETAMINOPHEN 5-325 MG PO TABS: 1.0000 | ORAL_TABLET | Freq: Once | ORAL | Status: DC

## 2021-01-21 NOTE — Assessment & Plan Note (Signed)
Patient with worsening infection of the right great toe and fourth toe of left foot; surrounding erythema concerning for cellulitis.  Ulceration of fourth digit concerning for osteomyelitis.  Patient's blood pressure is stable; however is tachypneic and tachycardic.  Lungs CTA bilaterally.  To me patient overall appears worse today in clinic than he did in the emergency department earlier in March. -Due to concern for infection, osteomyelitis escorted patient to the emergency department for further evaluation. -CBC with differential, CMP, CRP, ESR were drawn after speaking to charge nurse in the ED who stated they were very backed up.

## 2021-01-21 NOTE — ED Triage Notes (Signed)
Blood work completed at previous Dr. Today.  Knife given to security at the front.

## 2021-01-21 NOTE — Progress Notes (Signed)
Duplicate encounter.  No charge.  Peggyann Shoals, DO [facility], PGY-3 01/21/2021 7:57 PM

## 2021-01-21 NOTE — Progress Notes (Signed)
    SUBJECTIVE:   CHIEF COMPLAINT / HPI:   Left foot pain, concern for infection: Patient presenting to clinic today to establish care.  He is following up today from his previous visit to the emergency department with concern for Covid toes versus fungal infection of his left great toe with questionable bacterial infection of left fourth toe.  Patient was given an antibiotic and symptoms had improved.    Patient follows up today, reports that over the last couple of days his pain has gotten significantly worse.  Patient reports he had very severe pain last night and had difficulty sleeping due to the pain.  He denies fevers, chills, body aches.  Patient took Tylenol about 2 hours before coming into the clinic.  Blood pressure is stable at 114/83; however, heart rate is elevated at 117.  Patient satting well at 98% on room air.  Respiratory rate about 40.    PERTINENT  PMH / PSH:  Patient Active Problem List   Diagnosis Date Noted  . Erroneous encounter - disregard 01/21/2021  . Toe infection 01/21/2021  . COVID toes 01/05/2021  . Fungal toenail infection 01/05/2021  . Dyslipidemia (high LDL; low HDL) 02/23/2016  . Non-STEMI (non-ST elevated myocardial infarction) ([place])   . Stented coronary artery   . NSTEMI (non-ST elevated myocardial infarction) ([place]) 02/22/2016     OBJECTIVE:   BP 114/83   Pulse (!) 117   Ht $R'5\' 6"'bt$  (1.676 m)   Wt 164 lb 12.8 oz (74.8 kg)   SpO2 98%   BMI 26.60 kg/m    Physical exam: General: Patient appears short of breath, moderate distress due to pain Respiratory: CTA bilaterally, moving air well, respiratory rate about 40, no wheezes or crackles appreciated Cardio: Regular rhythm, clear S1 and S2, no murmurs, heart rate 117 Left foot: 2+ DP pulse appreciated, brisk capillary refill less than 1 second, sensation intact; ulceration appreciated to patient's dorsal aspect of great toe nail concerning for fungal infection; worsening ulceration of patient's  fourth toe appreciated to medial plantar aspect (see photo)       ASSESSMENT/PLAN:   Toe infection Patient with worsening infection of the right great toe and fourth toe of left foot; surrounding erythema concerning for cellulitis.  Ulceration of fourth digit concerning for osteomyelitis.  Patient's blood pressure is stable; however is tachypneic and tachycardic.  Lungs CTA bilaterally.  To me patient overall appears worse today in clinic than he did in the emergency department earlier in March. -Due to concern for infection, osteomyelitis escorted patient to the emergency department for further evaluation. -CBC with differential, CMP, CRP, ESR were drawn after speaking to charge nurse in the ED who stated they were very backed up.  Dyslipidemia (high LDL; low HDL) -Rechecking lipid panel -Checking A1c as well     [place]

## 2021-01-21 NOTE — Telephone Encounter (Signed)
Received page from [facility] with results of stat labs from earlier today including CBC, CMP, sed rate, and CRP.  Abnormal values as follows:   Glucose 100 Chloride 109 CO2 17 Sed rate 35  Full results should hopefully populate into epic soon.  Patient currently waiting to be seen in the ED.  Allayne Stack, DO

## 2021-01-21 NOTE — ED Triage Notes (Signed)
Pt. Stated, Evan Rodriguez had a bad left big toe for a month. I saw the Dr. 2 weeks ago and was given an antibiotic and pain pills they are gone. Saw Dr. Today to get toenail removed and she sent me here.

## 2021-01-21 NOTE — Assessment & Plan Note (Signed)
-  Rechecking lipid panel -Checking A1c as well

## 2021-01-22 ENCOUNTER — Emergency Department (HOSPITAL_COMMUNITY): Payer: 59

## 2021-01-22 ENCOUNTER — Observation Stay (HOSPITAL_BASED_OUTPATIENT_CLINIC_OR_DEPARTMENT_OTHER): Payer: 59

## 2021-01-22 ENCOUNTER — Encounter (HOSPITAL_COMMUNITY): Payer: Self-pay | Admitting: Family Medicine

## 2021-01-22 DIAGNOSIS — L988 Other specified disorders of the skin and subcutaneous tissue: Secondary | ICD-10-CM

## 2021-01-22 DIAGNOSIS — F17200 Nicotine dependence, unspecified, uncomplicated: Secondary | ICD-10-CM | POA: Diagnosis not present

## 2021-01-22 DIAGNOSIS — R700 Elevated erythrocyte sedimentation rate: Secondary | ICD-10-CM

## 2021-01-22 DIAGNOSIS — E876 Hypokalemia: Secondary | ICD-10-CM

## 2021-01-22 DIAGNOSIS — L97529 Non-pressure chronic ulcer of other part of left foot with unspecified severity: Secondary | ICD-10-CM

## 2021-01-22 DIAGNOSIS — L0390 Cellulitis, unspecified: Secondary | ICD-10-CM | POA: Diagnosis not present

## 2021-01-22 DIAGNOSIS — I70262 Atherosclerosis of native arteries of extremities with gangrene, left leg: Secondary | ICD-10-CM | POA: Diagnosis not present

## 2021-01-22 DIAGNOSIS — I959 Hypotension, unspecified: Secondary | ICD-10-CM | POA: Clinically undetermined

## 2021-01-22 DIAGNOSIS — F17211 Nicotine dependence, cigarettes, in remission: Secondary | ICD-10-CM | POA: Diagnosis present

## 2021-01-22 HISTORY — DX: Hypokalemia: E87.6

## 2021-01-22 HISTORY — DX: Nicotine dependence, unspecified, uncomplicated: F17.200

## 2021-01-22 LAB — COMPREHENSIVE METABOLIC PANEL
ALT: 27 IU/L (ref 0–44)
AST: 21 IU/L (ref 0–40)
Albumin/Globulin Ratio: 1.5 (ref 1.2–2.2)
Albumin: 4.4 g/dL (ref 4.0–5.0)
Alkaline Phosphatase: 81 IU/L (ref 44–121)
BUN/Creatinine Ratio: 15 (ref 9–20)
BUN: 14 mg/dL (ref 6–24)
Bilirubin Total: <0.2 mg/dL (ref 0.0–1.2)
CO2: 17 mmol/L — ABNORMAL LOW (ref 20–29)
Calcium: 9.2 mg/dL (ref 8.7–10.2)
Chloride: 109 mmol/L — ABNORMAL HIGH (ref 96–106)
Creatinine, Ser: 0.95 mg/dL (ref 0.76–1.27)
Globulin, Total: 3 g/dL (ref 1.5–4.5)
Glucose: 100 mg/dL — ABNORMAL HIGH (ref 65–99)
Potassium: 4 mmol/L (ref 3.5–5.2)
Sodium: 142 mmol/L (ref 134–144)
Total Protein: 7.4 g/dL (ref 6.0–8.5)
eGFR: 100 mL/min/{1.73_m2} (ref >59)

## 2021-01-22 LAB — CBC WITH DIFFERENTIAL
Basophils Absolute: 0.1 10*3/uL (ref 0.0–0.2)
Basos: 1 %
EOS (ABSOLUTE): 0.1 10*3/uL (ref 0.0–0.4)
Eos: 1 %
Hematocrit: 45.9 % (ref 37.5–51.0)
Hemoglobin: 15.6 g/dL (ref 13.0–17.7)
Immature Grans (Abs): 0 10*3/uL (ref 0.0–0.1)
Immature Granulocytes: 0 %
Lymphocytes Absolute: 2.5 10*3/uL (ref 0.7–3.1)
Lymphs: 30 %
MCH: 31.3 pg (ref 26.6–33.0)
MCHC: 34 g/dL (ref 31.5–35.7)
MCV: 92 fL (ref 79–97)
Monocytes Absolute: 0.6 10*3/uL (ref 0.1–0.9)
Monocytes: 7 %
Neutrophils Absolute: 5.1 10*3/uL (ref 1.4–7.0)
Neutrophils: 61 %
RBC: 4.99 x10E6/uL (ref 4.14–5.80)
RDW: 14.8 % (ref 11.6–15.4)
WBC: 8.4 10*3/uL (ref 3.4–10.8)

## 2021-01-22 LAB — SEDIMENTATION RATE: Sed Rate: 35 mm/hr — ABNORMAL HIGH (ref 0–15)

## 2021-01-22 LAB — HIV ANTIBODY (ROUTINE TESTING W REFLEX): HIV Screen 4th Generation wRfx: NONREACTIVE

## 2021-01-22 LAB — HEMOGLOBIN A1C
Hgb A1c MFr Bld: 5.9 % — ABNORMAL HIGH (ref 4.8–5.6)
Mean Plasma Glucose: 122.63 mg/dL

## 2021-01-22 LAB — C-REACTIVE PROTEIN: CRP: 10 mg/L (ref 0–10)

## 2021-01-22 MED ORDER — ONDANSETRON HCL 4 MG/2ML IJ SOLN: 4.0000 mg | Freq: Four times a day (QID) | INTRAMUSCULAR | Status: DC | PRN

## 2021-01-22 MED ORDER — ACETAMINOPHEN 325 MG PO TABS
650.0000 mg | ORAL_TABLET | Freq: Four times a day (QID) | ORAL | Status: DC | PRN
  Administered 2021-01-23: 650 mg via ORAL
  Filled 2021-01-22: qty 2

## 2021-01-22 MED ORDER — COLLAGENASE 250 UNIT/GM EX OINT
TOPICAL_OINTMENT | Freq: Every day | CUTANEOUS | Status: DC
  Filled 2021-01-22 (×2): qty 30

## 2021-01-22 MED ORDER — HEPARIN SODIUM (PORCINE) 5000 UNIT/ML IJ SOLN
5000.0000 [IU] | Freq: Three times a day (TID) | INTRAMUSCULAR | Status: DC
  Administered 2021-01-22 – 2021-01-23 (×3): 5000 [IU] via SUBCUTANEOUS
  Filled 2021-01-22 (×3): qty 1

## 2021-01-22 MED ORDER — HYDROCODONE-ACETAMINOPHEN 5-325 MG PO TABS
1.0000 | ORAL_TABLET | ORAL | Status: DC | PRN
  Filled 2021-01-22: qty 2

## 2021-01-22 MED ORDER — SILVER SULFADIAZINE 1 % EX CREA
TOPICAL_CREAM | Freq: Two times a day (BID) | CUTANEOUS | Status: DC
  Filled 2021-01-22: qty 85

## 2021-01-22 MED ORDER — ACETAMINOPHEN 650 MG RE SUPP: 650.0000 mg | Freq: Four times a day (QID) | RECTAL | Status: DC | PRN

## 2021-01-22 MED ORDER — HYDROMORPHONE HCL 1 MG/ML IJ SOLN
1.0000 mg | Freq: Once | INTRAMUSCULAR | Status: AC
Start: 2021-01-22 — End: 2021-01-22
  Administered 2021-01-22: 1 mg via INTRAVENOUS
  Filled 2021-01-22: qty 1

## 2021-01-22 MED ORDER — ASPIRIN EC 81 MG PO TBEC
81.0000 mg | DELAYED_RELEASE_TABLET | Freq: Every day | ORAL | Status: DC
  Administered 2021-01-22 – 2021-01-24 (×3): 81 mg via ORAL
  Filled 2021-01-22 (×3): qty 1

## 2021-01-22 MED ORDER — CLOTRIMAZOLE 1 % EX CREA
TOPICAL_CREAM | Freq: Two times a day (BID) | CUTANEOUS | Status: DC
  Filled 2021-01-22: qty 15

## 2021-01-22 MED ORDER — POLYETHYLENE GLYCOL 3350 17 G PO PACK: 17.0000 g | PACK | Freq: Every day | ORAL | Status: DC | PRN

## 2021-01-22 MED ORDER — ENOXAPARIN SODIUM 40 MG/0.4ML ~~LOC~~ SOLN: 40.0000 mg | SUBCUTANEOUS | Status: DC

## 2021-01-22 MED ORDER — NICOTINE 21 MG/24HR TD PT24
21.0000 mg | MEDICATED_PATCH | Freq: Every day | TRANSDERMAL | Status: DC
  Administered 2021-01-23 – 2021-01-24 (×2): 21 mg via TRANSDERMAL
  Filled 2021-01-22 (×3): qty 1

## 2021-01-22 MED ORDER — LORAZEPAM 2 MG/ML IJ SOLN
1.0000 mg | Freq: Once | INTRAMUSCULAR | Status: AC
  Administered 2021-01-22: 1 mg via INTRAVENOUS
  Filled 2021-01-22: qty 1

## 2021-01-22 MED ORDER — HYDROCODONE-ACETAMINOPHEN 5-325 MG PO TABS
1.0000 | ORAL_TABLET | ORAL | Status: DC | PRN
  Administered 2021-01-22 – 2021-01-24 (×8): 1 via ORAL
  Filled 2021-01-22 (×8): qty 1

## 2021-01-22 MED ORDER — ALUM SULFATE-CA ACETATE EX PACK
1.0000 | PACK | Freq: Three times a day (TID) | CUTANEOUS | Status: DC
  Administered 2021-01-22 – 2021-01-24 (×4): 1 via TOPICAL
  Filled 2021-01-22 (×9): qty 1

## 2021-01-22 MED ORDER — KCL IN DEXTROSE-NACL 20-5-0.9 MEQ/L-%-% IV SOLN
INTRAVENOUS | Status: DC
  Filled 2021-01-22: qty 1000

## 2021-01-22 MED ORDER — ONDANSETRON HCL 4 MG PO TABS: 4.0000 mg | ORAL_TABLET | Freq: Four times a day (QID) | ORAL | Status: DC | PRN

## 2021-01-22 NOTE — ED Notes (Signed)
Pt transported by vascular for [place].

## 2021-01-22 NOTE — Evaluation (Signed)
Physical Therapy Evaluation Patient Details Name: Evan Rodriguez MRN: [id number] DOB: Feb 05, 1976 Today's Date: 01/22/2021   History of Present Illness  Pt is a 45 y/o male admitted 3/21 secondary to worsening Left Fourth Toe Ulceration. Xray negative, however, awaiting further imaging to determine if any other abnormalities of L 4th toe. PMH includes tobacco use, COVID, and NSTEMI s/p PCI.  Clinical Impression  Pt admitted secondary to problem above with deficits below. Pt very guarded during gait secondary to pain in L toes. LOB X2, but able to recover with min guard for safety. Anticipate pt will progress well and will likely not require follow up PT. May benefit from use of cane to help with pain management. Will attempt to practice with cane during next session. Will continue to follow acutely.     Follow Up Recommendations No PT follow up    Equipment Recommendations  Cane    Recommendations for Other Services       Precautions / Restrictions Precautions Precautions: Fall Restrictions Weight Bearing Restrictions: No      Mobility  Bed Mobility Overal bed mobility: Modified Independent                  Transfers Overall transfer level: Needs assistance Equipment used: None Transfers: Sit to/from Stand Sit to Stand: Min guard         General transfer comment: Min guard for safety. Impulsive to stand.  Ambulation/Gait Ambulation/Gait assistance: Min guard;Supervision Gait Distance (Feet): 50 Feet Assistive device: None Gait Pattern/deviations: Decreased weight shift to left;Antalgic Gait velocity: Decreased   General Gait Details: Antalgic gait. Pt walking on heel on L foot secondary to pain. LOB X2, but able to recover with min guard for safety.  Stairs            Wheelchair Mobility    Modified Rankin (Stroke Patients Only)       Balance Overall balance assessment: Needs assistance Sitting-balance support: No upper extremity  supported Sitting balance-Leahy Scale: Fair     Standing balance support: No upper extremity supported;During functional activity Standing balance-Leahy Scale: Fair                               Pertinent Vitals/Pain Pain Assessment: Faces Faces Pain Scale: Hurts even more Pain Location: L foot Pain Descriptors / Indicators: Grimacing;Guarding Pain Intervention(s): Limited activity within patient's tolerance;Monitored during session;Repositioned    Home Living Family/patient expects to be discharged to:: Private residence Living Arrangements: Children Available Help at Discharge: Family;Available PRN/intermittently Type of Home: House Home Access: Ramped entrance     Home Layout: One level Home Equipment: None      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     Hand Dominance        Extremity/Trunk Assessment   Upper Extremity Assessment Upper Extremity Assessment: Defer to OT evaluation    Lower Extremity Assessment Lower Extremity Assessment: LLE deficits/detail LLE Deficits / Details: Very sensitive to touch and guarded throughout mobility    Cervical / Trunk Assessment Cervical / Trunk Assessment: Normal  Communication   Communication: No difficulties  Cognition Arousal/Alertness: Awake/alert Behavior During Therapy: Flat affect Overall Cognitive Status: No family/caregiver present to determine baseline cognitive functioning  General Comments: Pt somewhat annoyed at PT presence. Very guarded attitude throughout. May be close to baseline.      General Comments General comments (skin integrity, edema, etc.): No family present    Exercises     Assessment/Plan    PT Assessment Patient needs continued PT services  PT Problem List Decreased strength;Decreased activity tolerance;Decreased balance;Decreased mobility;Decreased  knowledge of precautions;Decreased safety awareness;Decreased knowledge of use of DME       PT Treatment Interventions Gait training;DME instruction;Therapeutic activities;Functional mobility training;Balance training;Therapeutic exercise;Patient/family education    PT Goals (Current goals can be found in the Care Plan section)  Acute Rehab PT Goals Patient Stated Goal: none stated PT Goal Formulation: With patient Time For Goal Achievement: 02/05/21 Potential to Achieve Goals: Good    Frequency Min 3X/week   Barriers to discharge        Co-evaluation               AM-PAC PT "6 Clicks" Mobility  Outcome Measure Help needed turning from your back to your side while in a flat bed without using bedrails?: None Help needed moving from lying on your back to sitting on the side of a flat bed without using bedrails?: None Help needed moving to and from a bed to a chair (including a wheelchair)?: A Little Help needed standing up from a chair using your arms (e.g., wheelchair or bedside chair)?: A Little Help needed to walk in hospital room?: A Little Help needed climbing 3-5 steps with a railing? : A Lot 6 Click Score: 19    End of Session   Activity Tolerance: Patient limited by pain Patient left: in bed;with call bell/phone within reach (on stretcher in ED) Nurse Communication: Mobility status PT Visit Diagnosis: Unsteadiness on feet (R26.81);Pain Pain - Right/Left: Left Pain - part of body: Ankle and joints of foot    Time: 2952-8413 PT Time Calculation (min) (ACUTE ONLY): 15 min   Charges:   PT Evaluation $PT Eval Low Complexity: 1 Low          Cindee Salt, DPT  Acute Rehabilitation Services  Pager: [phone] Office: [phone]   Le[facility] Prom 01/22/2021, 1:09 PM

## 2021-01-22 NOTE — ED Notes (Signed)
Tray ordered for pt.

## 2021-01-22 NOTE — ED Notes (Signed)
Dr. [name] said it was ok with him if pt eats, secretary has paged family medicine resident to this RN's phone so a diet order can be received. [place] sandwich was given to pt for now.

## 2021-01-22 NOTE — ED Notes (Signed)
Into medicate pt for his complaints of pain, snoring, resp even and nonlabored

## 2021-01-22 NOTE — ED Notes (Signed)
Refusing 12 lead EKG, I will try again later

## 2021-01-22 NOTE — ED Notes (Signed)
Pt transported to MRI 

## 2021-01-22 NOTE — ED Provider Notes (Signed)
[facility] EMERGENCY DEPARTMENT Provider Note   CSN: [id number] Arrival date & time: 01/21/21  1543     History Chief Complaint  Patient presents with  . Toe Pain    Evan Rodriguez is a 45 y.o. male.  45 year old male presents to the emergency department for further evaluation of toe pain and discharge.  Has been attempting to manage wounds to his toes since January.  Has been on multiple courses of antibiotics.  Most recent course was completed sometime last week.  States that he continues to have a persistent burning discomfort in his toes.  Most notably his left great toe and left fourth digit.  Has some purulent drainage from the site.  Was supposed to have his toenail removed by his primary care doctor today, but was sent to the emergency department given subjective worsening.  Using over-the-counter ibuprofen for pain as needed.  No associated fevers.  The history is provided by the patient and medical records. No language interpreter was used.  Toe Pain       Past Medical History:  Diagnosis Date  . COVID-19   . NSTEMI (non-ST elevated myocardial infarction) ([facility]) 02/22/2016    Patient Active Problem List   Diagnosis Date Noted  . Erroneous encounter - disregard 01/21/2021  . Toe infection 01/21/2021  . COVID toes 01/05/2021  . Fungal toenail infection 01/05/2021  . Dyslipidemia (high LDL; low HDL) 02/23/2016  . Non-STEMI (non-ST elevated myocardial infarction) ([facility])   . Stented coronary artery   . NSTEMI (non-ST elevated myocardial infarction) ([facility]) 02/22/2016    Past Surgical History:  Procedure Laterality Date  . CARDIAC CATHETERIZATION N/A 02/22/2016   Procedure: Left Heart Cath and Coronary Angiography;  Surgeon: Tonny Bollman, MD;  Location: [facility] INVASIVE CV LAB;  Service: Cardiovascular;  Laterality: N/A;  . CARDIAC CATHETERIZATION N/A 02/22/2016   Procedure: Coronary Stent Intervention;  Surgeon: Tonny Bollman, MD;  Location: [facility] INVASIVE  CV LAB;  Service: Cardiovascular;  Laterality: N/A;  . PERCUTANEOUS CORONARY STENT INTERVENTION (PCI-S)    . WRIST SURGERY         Family History  Problem Relation Age of Onset  . Hypertension Maternal Grandfather     Social History   Tobacco Use  . Smoking status: Current Every Day Smoker    Packs/day: 1.50    Years: 20.00    Pack years: 30.00    Types: Cigarettes  . Smokeless tobacco: Current User    Types: Chew  Vaping Use  . Vaping Use: Some days  Substance Use Topics  . Alcohol use: Yes    Comment: occasionally  . Drug use: No    Home Medications Prior to Admission medications   Medication Sig Start Date End Date Taking? Authorizing Provider  Acetaminophen (TYLENOL PO) Take by mouth.    [provider]  aspirin 81 MG tablet Take 81 mg by mouth daily.    [provider]  Ibuprofen (ADVIL PO) Take by mouth.    [provider]    Allergies    Patient has no known allergies.  Review of Systems   Review of Systems  Ten systems reviewed and are negative for acute change, except as noted in the HPI.    Physical Exam Updated Vital Signs BP 93/67   Pulse 89   Temp 98.5 F (36.9 C)   Resp 16   SpO2 95%   Physical Exam Vitals and nursing note reviewed.  Constitutional:      General: He  is not in acute distress.    Appearance: He is well-developed. He is not diaphoretic.     Comments: Nontoxic appearing and in NAD  HENT:     Head: Normocephalic and atraumatic.  Eyes:     General: No scleral icterus.    Conjunctiva/sclera: Conjunctivae normal.  Cardiovascular:     Rate and Rhythm: Normal rate and regular rhythm.     Pulses: Normal pulses.     Comments: DP pulse 2+ in the left lower extremity.  Capillary refill 3 seconds in all digits. Pulmonary:     Effort: Pulmonary effort is normal. No respiratory distress.     Comments: Respirations even and unlabored Musculoskeletal:     Cervical back: Normal range of motion.      Comments: Ulceration to the distal tip of the left great toe as well as the distal medial aspect of the fourth toe.  Ulceration to the webbed space between the fourth and fifth digits.  Ulcerations with associated TTP and purulence.  No heat to touch, significant edema, lymphangitic streaking.  Skin:    General: Skin is warm and dry.     Coloration: Skin is not pale.     Findings: No erythema or rash.  Neurological:     Mental Status: He is alert and oriented to person, place, and time.  Psychiatric:        Behavior: Behavior normal.     ED Results / Procedures / Treatments   Labs (all labs ordered are listed, but only abnormal results are displayed) Labs Reviewed - No data to display  EKG None  Radiology DG Foot Complete Left  Result Date: 01/21/2021 CLINICAL DATA:  Great toe pain. EXAM: LEFT FOOT - COMPLETE 3+ VIEW COMPARISON:  01/05/2021 FINDINGS: No acute bony abnormality. Specifically, no fracture, subluxation, or dislocation. Joint spaces maintained. No bone destruction to suggest osteomyelitis. IMPRESSION: No acute bony abnormality. Electronically Signed   By: Charlett Nose M.D.   On: 01/21/2021 23:09    Procedures Procedures   Medications Ordered in ED Medications  LORazepam (ATIVAN) injection 1 mg (1 mg Intravenous Given 01/22/21 0320)  HYDROmorphone (DILAUDID) injection 1 mg (1 mg Intravenous Given 01/22/21 0320)    ED Course  I have reviewed the triage vital signs and the nursing notes.  Pertinent labs & imaging results that were available during my care of the patient were reviewed by me and considered in my medical decision making (see chart for details).  Clinical Course as of 01/22/21 0522  Tue Jan 22, 2021  0014 Spoke with MD Annia Friendly of family practice service.  While patient has signs of ongoing and progressive wounds, does not meet criteria for SIRS/sepsis; no leukocytosis, tachypnea, hypotension, fever.  Plan to proceed with MRI to definitively evaluate for  osteomyelitis of the digits. Will admit if positive. If negative, anticipate discharge with close clinic f/u. [KH]  0203 Notified by RN that patient was unable to have MRI completed.  He was reportedly banging on the side of the MRI machine complaining of pain.  Was sleeping prior to transport to MRI and is sleeping again now.  Will order Ativan and Dilaudid to be given prior to transport back to MRI for completion of ordered imaging. [KH]  0507 Patient reportedly was moving too much during second MRI attempted despite medications.  Was transported back to the ED.  I have reached out to the patient's family medicine team regarding issues obtaining MRI.  Pending further discussion to determine disposition. [KH]  1517 Family practice service to admit for IV abx, wound care, inpatient MRI. [KH]    Clinical Course User Index [KH] Darylene Price   MDM Rules/Calculators/A&P                          45 year old male presents to the emergency department for evaluation of wounds to his left foot and toes.  These have been ongoing since January.  Has been on multiple courses of antibiotics without symptom resolution.  Onset thought to be secondary to COVID infection.  Sent to the emergency department given concern for osteomyelitis.  He has an elevated sedimentation rate, but no fever or leukocytosis.  Attempted to get MRI to further evaluate for osteomyelitis, but patient unable to tolerate MRI despite anxiolysis.  Case discussed with family practice service who will admit for IV antibiotics, ongoing wound care.  Plan to broach MRI again while inpatient.  Patient agreeable to plan.   Final Clinical Impression(s) / ED Diagnoses Final diagnoses:  Skin ulcer of left foot including toes, unspecified ulcer stage (HCC)  Elevated sed rate    Rx / DC Orders ED Discharge Orders    None       Antony Madura, PA-C 01/22/21 Pollie Meyer, MD 01/22/21 [phone]

## 2021-01-22 NOTE — Hospital Course (Addendum)
Evan Rodriguez is a 45 y.o. male presenting with left fourth toe pain and ulceration since January. PMH is significant for previous NSTEMI with coronary stent, dyslipidemia and tobacco abuse.   Left 4th toe ulceration  Concern for osteomyelitis        All other chronic conditions remained stable during hospitalization.   Issues for follow-up: LDL goal <70 for secondary prevention Started Crestor ***mg, ensure compliance

## 2021-01-22 NOTE — Consult Note (Signed)
WOC Nurse Consult Note: Patient receiving care in [facility] ED26. Chart and images reviewed. Reason for Consult: left 4th toe ulceration Wound type:  Per note from ED PA Antony Madura on 3/22 at 12:47 a.m. "45 year old male presents to the emergency department for evaluation of wounds to his left foot and toes.  These have been ongoing since January.  Has been on multiple courses of antibiotics without symptom resolution.  Onset thought to be secondary to COVID infection.  Sent to the emergency department given concern for osteomyelitis.  He has an elevated sedimentation rate, but no fever or leukocytosis.  Attempted to get MRI to further evaluate for osteomyelitis, but patient unable to tolerate MRI despite anxiolysis." Pressure Injury POA: Yes/No/NA Measurement: see images of toes and left foot Wound bed: Drainage (amount, consistency, odor)  Periwound: Dressing procedure/placement/frequency:  Twice daily application of 1% Silvadene and gauze for the left 4th toe has been ordered. WOC nurse will not follow at this time.  Please re-consult the WOC team if needed.  Helmut Muster, RN, MSN, CWOCN, CNS-BC, pager [phone]

## 2021-01-22 NOTE — Progress Notes (Signed)
Patient arrived on the unit from ED on a stretcher accompanied by the transport team member. Alert and oriented x 4.  Patient transferred from stretcher to bed without assistance. Patient oriented with the call light system.

## 2021-01-22 NOTE — Consult Note (Signed)
REASON FOR CONSULT:    Peripheral vascular disease with gangrene of the left fourth toe.  The consult is requested by Dr. McDiarmid  ASSESSMENT & PLAN:   PERIPHERAL VASCULAR DISEASE WITH GANGRENE LEFT FOURTH TOE: This patient has evidence of infrainguinal arterial occlusive disease on exam with a wound on the left great toe and dry gangrene of the left fourth toe.  He just recently established primary medical care.  He had an MI 8 years ago but has not followed up with his cardiologist.  He had undergone PTCA in the past.  Given the wound on the left foot with evidence of severe infrainguinal arterial occlusive disease, I have explained to the patient and his daughter that this is a limb threatening situation.  I recommend that we proceed with arteriography.  If he is able to settle down by tomorrow we can schedule his arteriogram for tomorrow with Dr. Lenell Antu.  Today however he is very agitated and threatening to leave.  I have reviewed with the patient the indications for arteriography. In addition, I have reviewed the potential complications of arteriography including but not limited to: Bleeding, arterial injury, arterial thrombosis, dye action, renal insufficiency, or other unpredictable medical problems. I have explained to the patient that if we find disease amenable to angioplasty we could potentially address this at the same time. I have discussed the potential complications of angioplasty and stenting, including but not limited to: Bleeding, arterial thrombosis, arterial injury, dissection, or the need for surgical intervention.   Evan Ferrari, MD Office: [phone]   HPI:   Evan Rodriguez is a pleasant 45 y.o. male, who presents today with pain in his left foot.  The history is obtained from the patient and his daughter who was with him at the bedside.  According to both of them a month ago he developed a wound on his left fourth toe with some cellulitis that was treated with  antibiotics.  The wound has not improved.  He also had a problem with his left great toe and reportedly was scheduled to have his toenail removed.  He presented to the emergency department with severe pain in the toe and noninvasive studies suggested evidence of severe peripheral vascular disease.  For this reason vascular surgery was consulted.  Prior to developing the wound on the toe, he denied any history of claudication, rest pain, or nonhealing ulcers.  His risk factors for peripheral vascular disease include tobacco use.  He smokes over a pack per day and has been smoking since he was 45 years old.  He denies any history of diabetes, hypertension, hypercholesterolemia, or family history of premature cardiovascular disease.  He did have Covid in January.  He had a non-ST elevated MI approximately 8 years ago and underwent PTCA.  He is on aspirin but is not on Plavix.  He is not on a statin.  He has not followed up with his cardiologist and he just recently establish primary medical care.  Past Medical History:  Diagnosis Date  . COVID-19   . NSTEMI (non-ST elevated myocardial infarction) (HCC) 02/22/2016   Stented  . Stented coronary artery   . Vaping nicotine dependence, non-tobacco product 01/22/2021    Family History  Problem Relation Age of Onset  . Hypertension Maternal Grandfather     SOCIAL HISTORY: Social History   Socioeconomic History  . Marital status: Widowed    Spouse name: Not on file  . Number of children: Not on file  . Years of  education: Not on file  . Highest education level: Not on file  Occupational History  . Occupation: [employer] doctor  Tobacco Use  . Smoking status: Current Every Day Smoker    Packs/day: 1.50    Years: 20.00    Pack years: 30.00    Types: Cigarettes  . Smokeless tobacco: Current User    Types: Chew  Vaping Use  . Vaping Use: Some days  Substance and Sexual Activity  . Alcohol use: Yes    Comment: occasionally  . Drug use: No  .  Sexual activity: Not on file  Other Topics Concern  . Not on file  Social History Narrative  . Not on file   Social Determinants of Health   Financial Resource Strain: Not on file  Food Insecurity: Not on file  Transportation Needs: Not on file  Physical Activity: Not on file  Stress: Not on file  Social Connections: Not on file  Intimate Partner Violence: Not on file    No Known Allergies  Current Facility-Administered Medications  Medication Dose Route Frequency Provider Last Rate Last Admin  . acetaminophen (TYLENOL) tablet 650 mg  650 mg Oral Q6H PRN Leticia Penna N, DO       Or  . acetaminophen (TYLENOL) suppository 650 mg  650 mg Rectal Q6H PRN Leticia Penna N, DO      . aluminum sulfate-calcium acetate (DOMEBORO) packet 1 packet  1 packet Topical TID McDiarmid, Leighton Roach, MD      . aspirin EC tablet 81 mg  81 mg Oral Daily Lilland, Alana, DO      . collagenase (SANTYL) ointment   Topical Daily McDiarmid, Leighton Roach, MD      . dextrose 5 % and 0.9 % NaCl with KCl 20 mEq/L infusion   Intravenous Continuous McDiarmid, Leighton Roach, MD      . heparin injection 5,000 Units  5,000 Units Subcutaneous Q8H Lilland, Alana, DO      . HYDROcodone-acetaminophen (NORCO/VICODIN) 5-325 MG per tablet 1 tablet  1 tablet Oral Q4H PRN McDiarmid, Leighton Roach, MD      . nicotine (NICODERM CQ - dosed in mg/24 hours) patch 21 mg  21 mg Transdermal Daily Beard, Samantha N, DO      . ondansetron (ZOFRAN) tablet 4 mg  4 mg Oral Q6H PRN Annia Friendly, Samantha N, DO       Or  . ondansetron (ZOFRAN) injection 4 mg  4 mg Intravenous Q6H PRN Beard, Samantha N, DO      . polyethylene glycol (MIRALAX / GLYCOLAX) packet 17 g  17 g Oral Daily PRN Allayne Stack, DO       Current Outpatient Medications  Medication Sig Dispense Refill  . aspirin 81 MG tablet Take 81 mg by mouth daily.      REVIEW OF SYSTEMS:  [X]  denotes positive finding, [ ]  denotes negative finding Cardiac  Comments:  Chest pain or chest pressure:     Shortness of breath upon exertion:    Short of breath when lying flat:    Irregular heart rhythm:        Vascular    Pain in calf, thigh, or hip brought on by ambulation:    Pain in feet at night that wakes you up from your sleep:  x   Blood clot in your veins:    Leg swelling:         Pulmonary    Oxygen at home:    Productive cough:  Wheezing:         Neurologic    Sudden weakness in arms or legs:     Sudden numbness in arms or legs:     Sudden onset of difficulty speaking or slurred speech:    Temporary loss of vision in one eye:     Problems with dizziness:         Gastrointestinal    Blood in stool:     Vomited blood:         Genitourinary    Burning when urinating:     Blood in urine:        Psychiatric    Major depression:         Hematologic    Bleeding problems:    Problems with blood clotting too easily:        Skin    Rashes or ulcers:        Constitutional    Fever or chills:     PHYSICAL EXAM:   Vitals:   01/22/21 0830 01/22/21 0900 01/22/21 0915 01/22/21 0930  BP: 102/73 103/70 106/72 100/71  Pulse: 100 97 92 89  Resp: 16 12 (!) 9 10  Temp: 98.3 F (36.8 C)     TempSrc: Oral     SpO2: 96% 97% 97% 97%    GENERAL: The patient is a well-nourished male, in no acute distress. The vital signs are documented above. CARDIAC: There is a regular rate and rhythm.  VASCULAR: I do not detect carotid bruits. He has palpable femoral pulses.  I cannot palpate pedal pulses. He has no significant lower extremity swelling. PULMONARY: There is good air exchange bilaterally without wheezing or rales. ABDOMEN: Soft and non-tender with normal pitched bowel sounds.  MUSCULOSKELETAL: There are no major deformities or cyanosis. NEUROLOGIC: No focal weakness or paresthesias are detected. SKIN: He has a wound on his left great toe and also so on his left fourth toe.  It was difficult to expose the fourth toe because he was having pain so the picture is not  ideal.      PSYCHIATRIC: The patient has a normal affect.  DATA:    ARTERIAL DOPPLER STUDY: I have independently interpreted his arterial Doppler study today.  On the right side he has a triphasic dorsalis pedis and posterior tibial signal with an ABI of 100%.  Toe pressure is 91 mmHg.  On the left side he has a dampened monophasic dorsalis pedis signal only with an ABI of 29%.  Toe pressures 0.  LABS: His GFR is 100.  His creatinine is 0.95.  White blood cell count 8.4.  Hemoglobin is 15.6.  Hemoglobin A1c 5.9.

## 2021-01-22 NOTE — Progress Notes (Signed)
MRI attempted x 2 w/ atavan and pain meds. Pt could not tolerate exams. Braxton sent to Resident and ordering phyisician on status of order.

## 2021-01-22 NOTE — ED Notes (Signed)
Pt was explained the plan moving forward with IV ABT, wound care & inpt MRI with his family member at bedside. Pt began yelling "Why are they so damn set on that MRI, just look at my foot! And see what is wrong with it!" Family at bedside trying to console him.

## 2021-01-22 NOTE — ED Notes (Signed)
Medicated wet dressing applied between toes of Lt foot, secured with Kerlix. Patient tolerated well.

## 2021-01-22 NOTE — ED Notes (Signed)
MRI tech reported to this RN that pt did not complete scan. Pt was banging on the inside of the machine and saying he was in too much pain. Pt was brought back to the room. PA made aware.

## 2021-01-22 NOTE — ED Notes (Signed)
Pt remains noncompliant with BP cuff, refuses to wear it and continues to take it off.

## 2021-01-22 NOTE — Progress Notes (Signed)
ABI w/ TBI study completed.  Preliminary results relayed to Gerilyn Pilgrim, RN for Edilia Bo, MD in vascular surgery.   See CV Proc for preliminary results report.   Jean Rosenthal, RDMS, RVT

## 2021-01-22 NOTE — H&P (Addendum)
[facility] Admission History and Physical Service Pager: [phone]  Patient name: Evan Rodriguez Medical record number: [id number] Date of birth: [birth year] Age: 45 y.o. Gender: male  Primary Care Provider: Daisy Floro, DO Consultants: None Code Status: FULL Preferred Emergency Contact: Louann Sjogren (significant other): [phone]  Chief Complaint: Toe pain  Assessment and Plan: Evan Rodriguez is a 45 y.o. male presenting with left fourth toe pain and ulceration since January. PMH is significant for previous NSTEMI with coronary stent, dyslipidemia and tobacco abuse.   Left Fourth Toe Ulceration, rule out osteomyelitis: Subacute, worsening.   Not improved following two cycles of oral antibiotics outpatient with Augmentin and Bactrim, however has had no wound care. Tachycardic on arrival to ED at 105 bpm, otherwise afebrile and hemodynamically stable. Reassuringly no leukocytosis and normal CRP (reported), however with mildly elevated ESR 35. Exam with 4-5 second cap refill on left foot, 3-4 seconds on right. LLE also slightly cooler to the touch and with faint distal pulses compared to right. Given history of significant tobacco abuse, known CAD requiring PCI, and HLD, patient is at risk for PAD. Additionally, unknown diabetic status which could also affect vasculature and wound healing, however doubt as random glucose 100 on recent labs. Will obtain arterial ultrasound for further evaluation. Symptoms initially began after COVID-19 illness in January, could be a result of COVID-toes complicated by poor healing. Unfortunately, two MRI attempts in ED unsuccessful, may need further sedation inpatient. Suspect current 4th toe ulceration through at least subcutaneous fat is multifactorial in nature, with possible COVID-19 sequelae, previous wound infection/cellulitis however does not appear actively infected currently, and underlying PAD especially with findings as  above and pain out of expected proportion. Will need to rule out osteomyelitis given appearance, hold IV antibiotics for now unless decompensates.   -Admit to FPTS med-surg observation, attending Dr. Wendy Poet -Vitals per floor protocol -Wound care consult  -PT/OT eval and treat -Acetaminophen 650 mg q6h PRN mild pain -Norco 1-2 tablets q4h PRN moderate pain  -Vascular U/S lower extremity arterial duplex to assess for PAD  -Assess MRI barriers with patient when more alert, reschedule MRI left toes +/- deep sedation for 3/22  -Pending result, consider IV antibiotics  -F/u ESR/CRP/CBC/A1c   Previous NSTEMI 02/2016 s/p PCI : Stable. Left heart cath in 2017 showing several two vessel CAD, PCI placed in Lcx/LAD/diagonal. S/p DAPT x 12 months. Appears to be lost to follow up after this. No current chest pain.  -Continue ASA 81 mg -Likely start statin as discussed below -Should establish care with Cardiology outpatient   Dyslipidemia: Chronic. Most recent lipid panel 2017 with elevated TG at 162, HDL 21 and LDL 147. LDL goal <70.  -F/u repeat Lipid panel -Heart healthy diet once not NPO -Should restart statin prior to discharge regardless for secondary prevention  Tobacco use: Chronic. 1 PPD. -Offer Nicotine patch 21 mg -Encourage cessation  FEN/GI: NPO given current sedated state and in case of further procedure Prophylaxis: SCDs, hold in case of procedure   Disposition: Med-surg  History of Present Illness:  Evan Rodriguez is a 45 y.o. male presenting with a worsening left foot lesion.  He initially presented for this complaint on 12/02/2020 to the ED after 3-week history of left foot pain, swelling, and rash that progressed to more redness around his fourth/fifth toes.  Treated as cellulitis with Augmentin.  He then returned to the ED on 01/05/2021 after his left foot continue to appear infected with erythema and draining  purulent fluid.  Symptoms had improved, however he continued to  experience majority of symptoms in his left fourth toe and now the nail of his first toe is starting to fall off with pain.  Foot x-ray without evidence of osteomyelitis.  Treated again as cellulitis with Bactrim and follow-up with Dr. Ouida Sills in the office on 3/21.  There was also some current concern that this may be related to "Covid toes "as is initially started when he was diagnosed with Covid in early January.  He completed the course of antibiotics, however appearance worsened with more ulceration formation on his fourth toe and increasing pain.  He established care within the family medicine clinic yesterday, 3/21, and previously has had poor follow-up, states he is hesitant to be seen by doctors.  He was sent to the ED for further evaluation and to rule out osteomyelitis given presentation.  Unable to assess much further information on evaluation due to recent ativan and dilaudid, level 5 caveat.   ED course: On arrival he was tachycardic to 105, but otherwise afebrile and hemodynamically stable.  Preliminary labs from clinic visit (abnormal labs provided via [facility] recorded in telephone note) showing no leukocytosis, normal hemoglobin, and mildly elevated ESR of 35.  Reported CRP was normal, however unclear value.  Attempted to obtain MRI twice to rule out osteomyelitis, but patient was frequently moving (despite second attempt with dilaudid and ativan).  Unable to assess his difficulty with MRI, when asked if he is claustrophobic, he reported "guess so."   Review Of Systems: Per HPI with the following additions:  Review of Systems  Reason unable to perform ROS: Patient sleeping, unable to wake for more than a few seconds at a time, poor historian at present.     Patient Active Problem List   Diagnosis Date Noted  . COVID toes 01/05/2021  . Fungal toenail infection 01/05/2021  . Dyslipidemia (high LDL; low HDL) 02/23/2016  . Non-STEMI (non-ST elevated myocardial infarction) ([place])   .  Stented coronary artery   . NSTEMI (non-ST elevated myocardial infarction) ([place]) 02/22/2016    Past Medical History: Past Medical History:  Diagnosis Date  . COVID-19   . NSTEMI (non-ST elevated myocardial infarction) ([place]) 02/22/2016    Past Surgical History: Past Surgical History:  Procedure Laterality Date  . CARDIAC CATHETERIZATION N/A 02/22/2016   Procedure: Left Heart Cath and Coronary Angiography;  Surgeon: Sherren Mocha, MD;  Location: [place] [facility];  Service: Cardiovascular;  Laterality: N/A;  . CARDIAC CATHETERIZATION N/A 02/22/2016   Procedure: Coronary Stent Intervention;  Surgeon: Sherren Mocha, MD;  Location: [facility];  Service: Cardiovascular;  Laterality: N/A;  . PERCUTANEOUS CORONARY STENT INTERVENTION (PCI-S)    . WRIST SURGERY      Social History: Social History   Tobacco Use  . Smoking status: Current Every Day Smoker    Packs/day: 1.50    Years: 20.00    Pack years: 30.00    Types: Cigarettes  . Smokeless tobacco: Current User    Types: Chew  Vaping Use  . Vaping Use: Some days  Substance Use Topics  . Alcohol use: Yes    Comment: occasionally  . Drug use: No   Please also refer to relevant sections of EMR.  Family History: Family History  Problem Relation Age of Onset  . Hypertension Maternal Grandfather     Allergies and Medications: No Known Allergies No current facility-administered medications on file prior to encounter.   Current Outpatient Medications on  File Prior to Encounter  Medication Sig Dispense Refill  . Acetaminophen (TYLENOL PO) Take by mouth.    Marland Kitchen aspirin 81 MG tablet Take 81 mg by mouth daily.    . Ibuprofen (ADVIL PO) Take by mouth.     Objective: BP 101/75   Pulse 89   Temp 98.5 F (36.9 C)   Resp 18   SpO2 95%  Exam: General: Resting comfortably but arouses to painful stimuli and voice, no distress, smells strongly of tobacco Cardiovascular: RRR without murmur, 2+ DP in RLE, ~ 1+ DP in  LLE Respiratory: Clear throughout with normal WOB however transmitted upper airway sounds  Gastrointestinal: soft, deep palpation without any facial grimacing, no rebound/guarding Derm: first digit with dorsal superficial ulceration, fourth left digit with distal medial ulceration (pictured below) approximately 1 cm, exposing subcutaneous fat. LLE cooler to touch compared to RLE with delayed cap refill >3 sec. No drainage present. Mild erythema noted on distal aspect of all digits. Pain with palpation of left 4th toe. No LE swelling bilaterally.  Neuro: resting and intermittently alert, arousable to painful stimuli and voice. Can move extremities spontaneously.  Psych: Unable to assess        Labs and Imaging: CBC BMET  No results for input(s): WBC, HGB, HCT, PLT in the last 168 hours. No results for input(s): NA, K, CL, CO2, BUN, CREATININE, GLUCOSE, CALCIUM in the last 168 hours.   EKG: Pending   DG Foot Complete Left  Result Date: 01/21/2021 CLINICAL DATA:  Great toe pain. EXAM: LEFT FOOT - COMPLETE 3+ VIEW COMPARISON:  01/05/2021 FINDINGS: No acute bony abnormality. Specifically, no fracture, subluxation, or dislocation. Joint spaces maintained. No bone destruction to suggest osteomyelitis. IMPRESSION: No acute bony abnormality. Electronically Signed   By: Rolm Baptise M.D.   On: 01/21/2021 23:09     Sharion Settler, DO 01/22/2021, 6:37 AM PGY-1, [place] Intern pager: [phone], text pages welcome  FPTS Upper-Level Resident Addendum   I have independently interviewed and examined the patient. I have discussed the above with the original author and agree with their documentation. My edits for correction/addition/clarification are added. Please see also any attending notes.    Patriciaann Clan, DO  Family Medicine PGY-3

## 2021-01-23 ENCOUNTER — Encounter (HOSPITAL_COMMUNITY)
Admission: EM | Disposition: A | Discharge status: Home / Self Care | Payer: Self-pay | Source: Home / Self Care | Attending: Emergency Medicine

## 2021-01-23 ENCOUNTER — Inpatient Hospital Stay (HOSPITAL_COMMUNITY): Payer: 59

## 2021-01-23 ENCOUNTER — Encounter (HOSPITAL_COMMUNITY): Payer: Self-pay | Admitting: Vascular Surgery

## 2021-01-23 DIAGNOSIS — E876 Hypokalemia: Secondary | ICD-10-CM

## 2021-01-23 DIAGNOSIS — L97529 Non-pressure chronic ulcer of other part of left foot with unspecified severity: Secondary | ICD-10-CM | POA: Diagnosis not present

## 2021-01-23 DIAGNOSIS — Z01810 Encounter for preprocedural cardiovascular examination: Secondary | ICD-10-CM

## 2021-01-23 DIAGNOSIS — I70262 Atherosclerosis of native arteries of extremities with gangrene, left leg: Secondary | ICD-10-CM | POA: Diagnosis not present

## 2021-01-23 DIAGNOSIS — R700 Elevated erythrocyte sedimentation rate: Secondary | ICD-10-CM

## 2021-01-23 DIAGNOSIS — F17200 Nicotine dependence, unspecified, uncomplicated: Secondary | ICD-10-CM | POA: Diagnosis not present

## 2021-01-23 HISTORY — PX: ABDOMINAL AORTOGRAM: CATH118222

## 2021-01-23 HISTORY — PX: LOWER EXTREMITY ANGIOGRAPHY: CATH118251

## 2021-01-23 LAB — BASIC METABOLIC PANEL
Anion gap: 6 (ref 5–15)
BUN: 19 mg/dL (ref 6–20)
CO2: 24 mmol/L (ref 22–32)
Calcium: 8.3 mg/dL — ABNORMAL LOW (ref 8.9–10.3)
Chloride: 107 mmol/L (ref 98–111)
Creatinine, Ser: 0.92 mg/dL (ref 0.61–1.24)
GFR, Estimated: >60 mL/min (ref >60)
Glucose, Bld: 124 mg/dL — ABNORMAL HIGH (ref 70–99)
Potassium: 3.4 mmol/L — ABNORMAL LOW (ref 3.5–5.1)
Sodium: 137 mmol/L (ref 135–145)

## 2021-01-23 LAB — CBC
HCT: 42.3 % (ref 39.0–52.0)
Hemoglobin: 14.5 g/dL (ref 13.0–17.0)
MCH: 31.9 pg (ref 26.0–34.0)
MCHC: 34.3 g/dL (ref 30.0–36.0)
MCV: 93.2 fL (ref 80.0–100.0)
Platelets: 264 10*3/uL (ref 150–400)
RBC: 4.54 MIL/uL (ref 4.22–5.81)
RDW: 16.3 % — ABNORMAL HIGH (ref 11.5–15.5)
WBC: 6.7 10*3/uL (ref 4.0–10.5)
nRBC: 0 % (ref 0.0–0.2)

## 2021-01-23 SURGERY — ABDOMINAL AORTOGRAM
Anesthesia: LOCAL

## 2021-01-23 MED ORDER — MIDAZOLAM HCL 2 MG/2ML IJ SOLN
INTRAMUSCULAR | Status: AC
  Filled 2021-01-23: qty 2

## 2021-01-23 MED ORDER — FENTANYL CITRATE (PF) 100 MCG/2ML IJ SOLN
INTRAMUSCULAR | Status: DC | PRN
  Administered 2021-01-23: 50 ug via INTRAVENOUS

## 2021-01-23 MED ORDER — MIDAZOLAM HCL 2 MG/2ML IJ SOLN
INTRAMUSCULAR | Status: DC | PRN
  Administered 2021-01-23: 1 mg via INTRAVENOUS

## 2021-01-23 MED ORDER — HEPARIN (PORCINE) IN NACL 1000-0.9 UT/500ML-% IV SOLN
INTRAVENOUS | Status: DC | PRN
  Administered 2021-01-23 (×2): 500 mL

## 2021-01-23 MED ORDER — IODIXANOL 320 MG/ML IV SOLN
INTRAVENOUS | Status: DC | PRN
  Administered 2021-01-23: 100 mL

## 2021-01-23 MED ORDER — LIDOCAINE HCL (PF) 1 % IJ SOLN
INTRAMUSCULAR | Status: DC | PRN
  Administered 2021-01-23: 15 mL

## 2021-01-23 MED ORDER — SODIUM CHLORIDE 0.9 % IV SOLN: INTRAVENOUS | Status: DC

## 2021-01-23 MED ORDER — POTASSIUM CHLORIDE CRYS ER 20 MEQ PO TBCR
40.0000 meq | EXTENDED_RELEASE_TABLET | Freq: Once | ORAL | Status: AC
  Administered 2021-01-23: 40 meq via ORAL
  Filled 2021-01-23: qty 2

## 2021-01-23 MED ORDER — HEPARIN (PORCINE) IN NACL 1000-0.9 UT/500ML-% IV SOLN
INTRAVENOUS | Status: AC
  Filled 2021-01-23: qty 1000

## 2021-01-23 MED ORDER — HEPARIN SODIUM (PORCINE) 5000 UNIT/ML IJ SOLN
5000.0000 [IU] | Freq: Three times a day (TID) | INTRAMUSCULAR | Status: DC
  Administered 2021-01-23 – 2021-01-24 (×3): 5000 [IU] via SUBCUTANEOUS
  Filled 2021-01-23 (×3): qty 1

## 2021-01-23 MED ORDER — LIDOCAINE HCL (PF) 1 % IJ SOLN
INTRAMUSCULAR | Status: AC
  Filled 2021-01-23: qty 30

## 2021-01-23 MED ORDER — FENTANYL CITRATE (PF) 100 MCG/2ML IJ SOLN
INTRAMUSCULAR | Status: AC
  Filled 2021-01-23: qty 2

## 2021-01-23 SURGICAL SUPPLY — 12 items
CATH OMNI FLUSH 5F 65CM (CATHETERS) ×3 IMPLANT
CATH STRAIGHT 5FR 65CM (CATHETERS) ×3 IMPLANT
DEVICE CLOSURE MYNXGRIP 5F (Vascular Products) ×3 IMPLANT
GUIDEWIRE ANGLED .035X150CM (WIRE) ×3 IMPLANT
KIT MICROPUNCTURE NIT STIFF (SHEATH) ×3 IMPLANT
KIT PV (KITS) ×3 IMPLANT
SHEATH PINNACLE 5F 10CM (SHEATH) ×3 IMPLANT
SHEATH PROBE COVER 6X72 (BAG) ×3 IMPLANT
SYR MEDRAD MARK V 150ML (SYRINGE) ×3 IMPLANT
TRANSDUCER W/STOPCOCK (MISCELLANEOUS) ×3 IMPLANT
TRAY PV CATH (CUSTOM PROCEDURE TRAY) ×3 IMPLANT
WIRE BENTSON .035X145CM (WIRE) ×3 IMPLANT

## 2021-01-23 NOTE — Plan of Care (Signed)
  Problem: Activity: Goal: Risk for activity intolerance will decrease Outcome: Progressing   Problem: Nutrition: Goal: Adequate nutrition will be maintained Outcome: Progressing   Problem: Coping: Goal: Level of anxiety will decrease Outcome: Progressing   

## 2021-01-23 NOTE — Progress Notes (Signed)
Family Medicine Teaching Service Daily Progress Note Intern Pager: [phone]  Patient name: Evan Rodriguez Medical record number: [id number] Date of birth: [birth year] Age: 45 y.o. Gender: male  Primary Care Provider: Dollene Cleveland, DO Consultants: None Code Status: Full  Pt Overview and Major Events to Date:  3/22 Admitted   Assessment and Plan: Evan Rodriguez is a 45 y.o. male presenting with left fourth toe pain and ulceration since January. PMH is significant for previous NSTEMI with coronary stent, dyslipidemia and tobacco abuse.   Left Fourth Toe Ulceration  Acute Limb Ischemia  rule out osteomyelitis: Subacute, worsening.   Vascular ultrasound showing critical limb ischemia. Arteriogram scheduled for today. Due to patient not tolerating MRI (3 attempts), will consider CT with contrast after arteriogram.  -Wound care consult  -PT/OT eval and treat -Acetaminophen 650 mg q6h PRN mild pain -Norco 1-2 tablets q4h PRN moderate pain  -F/u arteriogram  -Consider starting back Lyrica to help with limb hypersensitivity and periodic limb movement   Previous NSTEMI 02/2016 s/p PCI : Stable. Left heart cath in 2017 showing several two vessel CAD, PCI placed in Lcx/LAD/diagonal. S/p DAPT x 12 months. Appears to be lost to follow up after this. No current chest pain.  -Continue ASA 81 mg -Should establish care with Cardiology outpatient   Hypokalemia  K+ 3.4 -Repleted with 40 meq Kcl - Con't to monitor and replete as needed   Dyslipidemia: Chronic. Most recent lipid panel 2017 with elevated TG at 162, HDL 21 and LDL 147. LDL goal <70.  -F/u repeat Lipid panel in am  -Heart healthy diet once not NPO -Starting statin prior to discharge   Tobacco use: Chronic. 1 PPD. -Offer Nicotine patch 21 mg -Encourage cessation  FEN/GI: NPO given current sedated state and in case of further procedure Prophylaxis: SCDs, hold in case of procedure   Disposition: Progressive.    Subjective:  Patient sleeping when I came into the room this morning. Still endorses L foot pain and states the wound dressing is tight and possibly causing more pain. Denies other complaints and goes back to sleep immediately.   Objective: Temp:  [97.5 F (36.4 C)-98.7 F (37.1 C)] 97.5 F (36.4 C) (03/23 0325) Pulse Rate:  [82-107] 86 (03/22 2222) Resp:  [9-24] 20 (03/23 0325) BP: (99-116)/(62-80) 115/77 (03/23 0325) SpO2:  [91 %-99 %] 97 % (03/22 2222) Physical Exam: General: Resting but arouses to voice, NAD Cardiovascular: RRR no murmurs Respiratory: crackles in all lung fields diffusely  Abdomen: soft, non distended, non tender Extremities: warm, dry. L foot with wound dressing without drainage. No LE swelling   Laboratory: Recent Labs  Lab 01/21/21 1531 01/23/21 0302  WBC 8.4 6.7  HGB 15.6 14.5  HCT 45.9 42.3  PLT  --  264   Recent Labs  Lab 01/21/21 1531 01/23/21 0302  NA 142 137  K 4.0 3.4*  CL 109* 107  CO2 17* 24  BUN 14 19  CREATININE 0.95 0.92  CALCIUM 9.2 8.3*  PROT 7.4  --   BILITOT <0.2  --   ALKPHOS 81  --   ALT 27  --   AST 21  --   GLUCOSE 100* 124*     Imaging/Diagnostic Tests: PERIPHERAL VASCULAR CATHETERIZATION  Result Date: 01/23/2021 DATE OF SERVICE: 01/23/2021  PATIENT:  Evan Rodriguez  45 y.o. male  PRE-OPERATIVE DIAGNOSIS:  Atherosclerosis of native arteries of left lower extremity causing gangrene  POST-OPERATIVE DIAGNOSIS:  Same  PROCEDURE:  1) US guided right common  femoral access 2) Aortogram 3) Left lower extremity angiogram with second order cannulation 4) Conscious sedation (27 minutes)   SURGEON:  Surgeon(s) and Role:    * Hawken, Rande Brunt, MD - Primary  ASSISTANT: none  ANESTHESIA:   local and IV sedation  EBL: min  BLOOD ADMINISTERED:none  DRAINS: none  LOCAL MEDICATIONS USED:  LIDOCAINE  SPECIMEN:  none  COUNTS: confirmed correct.  TOURNIQUET:  none  PATIENT DISPOSITION:  PACU - hemodynamically stable.  Delay  start of Pharmacological VTE agent (>24hrs) due to surgical blood loss or risk of bleeding: no  INDICATION FOR PROCEDURE: Evan Rodriguez is a 45 y.o. male with left fourth toe gangrene with an ABI of 0.2. After careful discussion of risks, benefits, and alternatives the patient was offered angiography. The patient understood and wished to proceed.  OPERATIVE FINDINGS: Unremarkable aortogram I was initially concerned he had an ostial LEIA lesion, but there was no pressure gradient across the LEIA -- LCIA. LEIA widely patent LCFA / LPFA / LSFA widely patent L popliteal occlusion, robust para-geniculate collaterals Reconstitution of L peroneal which courses to the ankle and arborizes.  DESCRIPTION OF PROCEDURE: After identification of the patient in the pre-operative holding area, the patient was transferred to the operating room. The patient was positioned supine on the operating room table. Anesthesia was induced. The groins was prepped and draped in standard fashion. A surgical pause was performed confirming correct patient, procedure, and operative location.  The right groin was anesthetized with subcutaneous injection of 1% lidocaine. Using ultrasound guidance, the right common femoral artery was accessed with micropuncture technique. Fluoroscopy was used to confirm cannulation over the femoral head. Sheathogram was not performed. The 26F sheath was upsized to 24F.  An 035 glidewire advantage was advanced into the distal aorta. Over the wire an omni flush catheter was advanced to the level of L2. Aortogram was performed - see above for details.  The left common iliac artery was selected with the 035 glidewire advantage. The wire was advanced into the common femoral artery. Over the wire the omni flush catheter was advanced into the external iliac artery. Selective angiography was performed - see above for details.  A mynx device was used to close the arteriotomy. Hemostasis was excellent upon completion.   Conscious sedation was administered with the use of IV fentanyl and midazolam under continuous physician and nurse monitoring. Heart rate, blood pressure, and oxygen saturation were continuously monitored. Total sedation time was 27 minutes  Upon completion of the case instrument and sharps counts were confirmed correct. The patient was transferred to the PACU in good condition. I was present for all portions of the procedure.  PLAN: Will discuss results with Dr. Edilia Bo. Likely will need fem-peroneal bypass. Will check vein mapping.  Rande Brunt. Lenell Antu, MD [facility] Phone Number: [phone] 01/23/2021 12:27 PM   Cora Collum, DO 01/23/2021, 7:40 AM PGY-1, [facility] Family Medicine FPTS Intern pager: [phone], text pages welcome

## 2021-01-23 NOTE — Plan of Care (Signed)

## 2021-01-23 NOTE — Progress Notes (Signed)
Bilateral LE vein mapping completed    Please see CV Proc for preliminary results.   Clint Guy, RVT

## 2021-01-23 NOTE — Progress Notes (Addendum)
PT Cancellation Note  Patient Details Name: Evan Rodriguez MRN: [id number] DOB: [birth year]   Cancelled Treatment:    Reason Eval/Treat Not Completed: Active bedrest order. RN reporting pt just had procedure and is on bedrest for next several hours. Will check back another day as appropriate.  Raymond Gurney, PT, DPT Acute Rehabilitation Services  Pager: [phone] Office: [phone]    Jewel Baize 01/23/2021, 1:15 PM

## 2021-01-23 NOTE — Progress Notes (Incomplete)
Family Medicine Teaching Service Daily Progress Note Intern Pager: [phone]  Patient name: Evan Rodriguez Medical record number: [id number] Date of birth: 1976/03/04 Age: 45 y.o. Gender: male  Primary Care Provider: Dollene Cleveland, DO Consultants: VVS Code Status: Full   Pt Overview and Major Events to Date:  3/22 Admitted 3/23 Arteriogram, vein mapping   Assessment and Plan:  Evan Rodriguez is a 45 y.o. male presenting with left fourth toe pain and ulceration since January. PMH is significant for previous NSTEMI with coronary stent, dyslipidemia and tobacco abuse.   Left Fourth Toe Ulceration  Acute Limb Ischemia Vascular ultrasound showing critical limb ischemia. Arteriogram showed L popliteal occlusion, with robust para-geniculate collaterals. Per VVS, will need femoral peroneal bypass, scheduled outpatient on 4/5.  Dr. Edilia Bo, VVS, ordered Darco shoe and will have PT work with him on how to use it and can then be discharged. He does feel toe looks infected and thus no need for abx.   -VVS consulted and following, appreciate continued care and recommendations  -Wound care consult  -PT/OT eval and treat -Acetaminophen 650 mg q6h PRN mild pain -Norco 1-2 tablets q4h PRN moderate pain- will send home with 12 tablets - F/u appt with PCP Monday - started Crestor 20  Previous NSTEMI 02/2016 s/p PCI : Stable. Left heart cath in 2017 showing several two vessel CAD, PCI placed in Lcx/LAD/diagonal. S/p DAPT x 12 months. Appears to be lost to follow up after this. No current chest pain.  -Continue ASA 81 mg -Should establish care with Cardiology outpatient   Hypokalemia  K+ 3.5 - Con't to monitor and replete as needed   Dyslipidemia: Chronic. Current lipid panel with LDL 100, HDL 23. LDL goal <70. Improvement from 2017 lipid panel.  -Heart healthy diet -Starting Crestor 20mg , with LDL goal <70  Tobacco use: Chronic. 1 PPD. -Offer Nicotine patch 21 mg -Encourage  cessation  FEN/GI: NPO given current sedated state and in case of further procedure Prophylaxis: SCDs, hold in case of procedure   Disposition: ***  Subjective:  ***  Objective: Temp:  [97.5 F (36.4 C)-98.7 F (37.1 C)] 97.9 F (36.6 C) (03/23 1245) Pulse Rate:  [82-158] 90 (03/23 1245) Resp:  [4-42] 12 (03/23 1245) BP: (99-142)/(65-106) 132/94 (03/23 1245) SpO2:  [93 %-99 %] 93 % (03/23 1245) Physical Exam: General: *** Cardiovascular: *** Respiratory: *** Abdomen: *** Extremities: ***  Laboratory: Recent Labs  Lab 01/21/21 1531 01/23/21 0302  WBC 8.4 6.7  HGB 15.6 14.5  HCT 45.9 42.3  PLT  --  264   Recent Labs  Lab 01/21/21 1531 01/23/21 0302  NA 142 137  K 4.0 3.4*  CL 109* 107  CO2 17* 24  BUN 14 19  CREATININE 0.95 0.92  CALCIUM 9.2 8.3*  PROT 7.4  --   BILITOT <0.2  --   ALKPHOS 81  --   ALT 27  --   AST 21  --   GLUCOSE 100* 124*    ***  Imaging/Diagnostic Tests: ***  01/25/21, DO 01/23/2021, 4:35 PM PGY-***, [facility] Family Medicine FPTS Intern pager: [phone], text pages welcome

## 2021-01-23 NOTE — Progress Notes (Signed)
DATE OF SERVICE: 01/23/2021  PATIENT:  Evan Rodriguez  45 y.o. male  PRE-OPERATIVE DIAGNOSIS:  Atherosclerosis of native arteries of left lower extremity causing gangrene  POST-OPERATIVE DIAGNOSIS:  Same  PROCEDURE:   1) US guided right common femoral access 2) Aortogram 3) Left lower extremity angiogram with second order cannulation  4) Conscious sedation (27 minutes)   SURGEON:  Surgeon(s) and Role:    * Leonie Douglas, MD - Primary  ASSISTANT: none  ANESTHESIA:   local and IV sedation  EBL: min  BLOOD ADMINISTERED:none  DRAINS: none   LOCAL MEDICATIONS USED:  LIDOCAINE   SPECIMEN:  none  COUNTS: confirmed correct.  TOURNIQUET:  none  PATIENT DISPOSITION:  PACU - hemodynamically stable.   Delay start of Pharmacological VTE agent (>24hrs) due to surgical blood loss or risk of bleeding: no  INDICATION FOR PROCEDURE: Evan Rodriguez is a 45 y.o. male with left fourth toe gangrene with an ABI of 0.2. After careful discussion of risks, benefits, and alternatives the patient was offered angiography. The patient understood and wished to proceed.  OPERATIVE FINDINGS:  Unremarkable aortogram I was initially concerned he had an ostial LEIA lesion, but there was no pressure gradient across the LEIA -- LCIA.  LEIA widely patent LCFA / LPFA / LSFA widely patent L popliteal occlusion, robust para-geniculate collaterals Reconstitution of L peroneal which courses to the ankle and arborizes.   DESCRIPTION OF PROCEDURE: After identification of the patient in the pre-operative holding area, the patient was transferred to the operating room. The patient was positioned supine on the operating room table. Anesthesia was induced. The groins was prepped and draped in standard fashion. A surgical pause was performed confirming correct patient, procedure, and operative location.  The right groin was anesthetized with subcutaneous injection of 1% lidocaine. Using ultrasound guidance, the  right common femoral artery was accessed with micropuncture technique. Fluoroscopy was used to confirm cannulation over the femoral head. Sheathogram was not performed. The 49F sheath was upsized to 61F.   An 035 glidewire advantage was advanced into the distal aorta. Over the wire an omni flush catheter was advanced to the level of L2. Aortogram was performed - see above for details.   The left common iliac artery was selected with the 035 glidewire advantage. The wire was advanced into the common femoral artery. Over the wire the omni flush catheter was advanced into the external iliac artery. Selective angiography was performed - see above for details.   A mynx device was used to close the arteriotomy. Hemostasis was excellent upon completion.  Conscious sedation was administered with the use of IV fentanyl and midazolam under continuous physician and nurse monitoring.  Heart rate, blood pressure, and oxygen saturation were continuously monitored.  Total sedation time was 27 minutes  Upon completion of the case instrument and sharps counts were confirmed correct. The patient was transferred to the PACU in good condition. I was present for all portions of the procedure.  PLAN: Will discuss results with Dr. Edilia Bo. Likely will need fem-peroneal bypass. Will check vein mapping.   Evan Rodriguez. Evan Antu, MD [facility] Phone Number: [phone] 01/23/2021 12:27 PM

## 2021-01-23 NOTE — Progress Notes (Signed)
01/23/21 1100  OT Visit Information  Last OT Received On 01/23/21  Assistance Needed +1  History of Present Illness Pt is a 45 y/o male admitted 3/21 secondary to worsening Left Fourth Toe Ulceration. Xray negative, however, awaiting further imaging to determine if any other abnormalities of L 4th toe. PMH includes tobacco use, COVID, and NSTEMI s/p PCI.  Precautions  Precautions Fall  Restrictions  Weight Bearing Restrictions No  Home Living  Family/patient expects to be discharged to: Private residence  Living Arrangements Children  Available Help at Discharge Family;Available PRN/intermittently  Type of Home House  Home Access Ramped entrance  Home Layout One level  Home Equipment None  Additional Comments Pt reports his children are 78 and 5 yo.  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  Communication  Communication No difficulties  Pain Assessment  Pain Assessment Faces  Faces Pain Scale 6  Pain Location L foot  Pain Descriptors / Indicators Grimacing;Guarding  Pain Intervention(s) Monitored during session;Limited activity within patient's tolerance;Repositioned  Cognition  Arousal/Alertness Awake/alert  Behavior During Therapy Flat affect  Overall Cognitive Status No family/caregiver present to determine baseline cognitive functioning  General Comments Very guarded attitude throughout. Minimal verbalizations. Terse responses. Baseline?  Upper Extremity Assessment  Upper Extremity Assessment Overall WFL for tasks assessed  Lower Extremity Assessment  Lower Extremity Assessment Defer to PT evaluation  Cervical / Trunk Assessment  Cervical / Trunk Assessment Normal  ADL  Overall ADL's  Needs assistance/impaired  Eating/Feeding Set up;Sitting  Grooming Set up;Min guard;Sitting;Standing  Upper Body Bathing Set up;Sitting  Lower Body Bathing Min guard;Sit to/from stand  Upper Body Dressing  Set up;Sitting   Lower Body Dressing Min guard;Sit to/from Freight forwarder guard;Ambulation  Toileting- Clothing Manipulation and Hygiene Min guard  Functional mobility during ADLs Min guard  General ADL Comments seeks single exremity support to walk in the room. Pt walks in WB through L heel position. Unsteady gait but did not need physical assist this session (walked to/from bathroom, stood at toilet to void).  Bed Mobility  Overal bed mobility Modified Independent  Transfers  Overall transfer level Needs assistance  Equipment used None  Transfers Sit to/from Stand  Sit to Stand Min guard  General transfer comment Min guard for safety. Impulsive to stand.  Balance  Overall balance assessment Needs assistance  Sitting-balance support No upper extremity supported  Sitting balance-Leahy Scale Fair  Standing balance support No upper extremity supported;During functional activity  Standing balance-Leahy Scale Fair  Standing balance comment intermittent single UE support walking  OT - End of Session  Activity Tolerance Patient tolerated treatment well  Patient left in bed;with call bell/phone within reach  OT Assessment  OT Recommendation/Assessment Patient needs continued OT Services  OT Visit Diagnosis Unsteadiness on feet (R26.81);Pain  Pain - Right/Left Left  Pain - part of body Ankle and joints of foot  OT Problem List Impaired balance (sitting and/or standing);Decreased knowledge of use of DME or AE;Decreased knowledge of precautions;Pain  OT Plan  OT Frequency (ACUTE ONLY) Min 2X/week  OT Treatment/Interventions (ACUTE ONLY) Self-care/ADL training;DME and/or AE instruction;Therapeutic activities;Patient/family education;Balance training  AM-PAC OT "6 Clicks" Daily Activity Outcome Measure (Version 2)  Help from another person eating meals? 4  Help from another person taking care of personal grooming? 4  Help from another person toileting, which includes using toliet, bedpan, or  urinal? 3  Help from another person bathing (including  washing, rinsing, drying)? 3  Help from another person to put on and taking off regular upper body clothing? 4  Help from another person to put on and taking off regular lower body clothing? 3  6 Click Score 21  OT Recommendation  Follow Up Recommendations No OT follow up  OT Equipment 3 in 1 bedside commode  Individuals Consulted  Consulted and Agree with Results and Recommendations Patient  Acute Rehab OT Goals  Patient Stated Goal none stated  Time For Goal Achievement 02/06/21  Potential to Achieve Goals Good  OT Time Calculation  OT Start Time (ACUTE ONLY) 1039  OT Stop Time (ACUTE ONLY) 1049  OT Time Calculation (min) 10 min  OT General Charges  $OT Visit 1 Visit  OT Evaluation  $OT Eval Low Complexity 1 Low   Pt admitted with the above diagnoses and presents with below problem list. Pt will benefit from continued acute OT to address the below listed deficits and maximize independence with basic ADLs prior to d/c home. At baseline pt is independent with ADLs. Pt currently needs up to min guard assist with LB ADLs, functional mobility and transfers. Noted to seek intermittent single UE support walking to/from bathroom. Continues to walk in L heel WB position resulting in unsteady gait; however, no physical assist needed this session.   Raynald Kemp, OT Acute Rehabilitation Services Pager: [phone] Office: [phone]

## 2021-01-23 NOTE — Progress Notes (Signed)
[facility] [place] PROGRESS NOTE  ASSESSMENT / PLAN: Evan Rodriguez is a 45 y.o. male with atherosclerosis of native arteries of left lower extremity causing gangrene.  Patient counseled patients with chronic limb threatening ischemia have an annual risk of cardiovascular mortality of 25% and a annual amputation risk of 25%. Aggressive risk factor modification and intervention for limb salvage is indicated..  Recommend the following which can slow the progression of atherosclerosis and reduce the risk of major adverse cardiac / limb events:  Complete cessation from all tobacco products. Blood glucose control with goal A1c < 7%. Blood pressure control with goal blood pressure < 140/90 mmHg. Lipid reduction therapy with goal LDL-C <100 mg/dL (<42 if symptomatic from PAD).  Aspirin 81mg  PO QD.  Atorvastatin 40-80mg  PO QD (or other "high intensity" statin therapy).  Plan left lower extremity angiogram with possible intervention via right common femoral artery approach in cath lab today.    SUBJECTIVE: No interval changes.  OBJECTIVE: BP 102/65 (BP Location: Right Arm)   Pulse 86   Temp 97.7 F (36.5 C) (Oral)   Resp 18   SpO2 99%   Intake/Output Summary (Last 24 hours) at 01/23/2021 1139 Last data filed at 01/23/2021 0819 Gross per 24 hour  Intake 240 ml  Output 600 ml  Net -360 ml    2+ femoral pulses Unchanged appearance of left foot  CBC Latest Ref Rng & Units 01/23/2021 01/21/2021 01/05/2021  WBC 4.0 - 10.5 K/uL 6.7 8.4 9.3  Hemoglobin 13.0 - 17.0 g/dL 03/07/2021 35.3 61.4  Hematocrit 39.0 - 52.0 % 42.3 45.9 45.3  Platelets 150 - 400 K/uL 264 - 227     CMP Latest Ref Rng & Units 01/23/2021 01/21/2021 01/05/2021  Glucose 70 - 99 mg/dL 03/07/2021) 540(G) 867(Y)  BUN 6 - 20 mg/dL 19 14 13   Creatinine 0.61 - 1.24 mg/dL 195(K 9.32  Sodium 135 - 145 mmol/L 137 142 138  Potassium 3.5 - 5.1 mmol/L 3.4(L) 4.0 3.4(L)  Chloride 98 - 111 mmol/L 107 109(H) 108  CO2 22  - 32 mmol/L 24 17(L) 24  Calcium 8.9 - 10.3 mg/dL 8.3(L) 9.2 9.2  Total Protein 6.0 - 8.5 g/dL - 7.4 7.1  Total Bilirubin 0.0 - 1.2 mg/dL - 6.71 0.7  Alkaline Phos 44 - 121 IU/L - 81 56  AST 0 - 40 IU/L - 21 22  ALT 0 - 44 IU/L - 27 25    Estimated Creatinine Clearance: 90.5 mL/min (by C-G formula based on SCr of 0.92 mg/dL).  2.45. <8.0, MD [facility] Phone Number: [phone] 01/23/2021 11:39 AM

## 2021-01-24 ENCOUNTER — Other Ambulatory Visit: Payer: Self-pay

## 2021-01-24 ENCOUNTER — Other Ambulatory Visit: Payer: Self-pay | Admitting: Family Medicine

## 2021-01-24 DIAGNOSIS — F17200 Nicotine dependence, unspecified, uncomplicated: Secondary | ICD-10-CM | POA: Diagnosis not present

## 2021-01-24 DIAGNOSIS — B353 Tinea pedis: Secondary | ICD-10-CM | POA: Diagnosis not present

## 2021-01-24 DIAGNOSIS — I70229 Atherosclerosis of native arteries of extremities with rest pain, unspecified extremity: Secondary | ICD-10-CM

## 2021-01-24 DIAGNOSIS — L97529 Non-pressure chronic ulcer of other part of left foot with unspecified severity: Secondary | ICD-10-CM | POA: Diagnosis not present

## 2021-01-24 LAB — LIPID PANEL
Cholesterol: 148 mg/dL (ref 0–200)
HDL: 23 mg/dL — ABNORMAL LOW (ref >40)
LDL Cholesterol: 100 mg/dL — ABNORMAL HIGH (ref 0–99)
Total CHOL/HDL Ratio: 6.4 RATIO
Triglycerides: 126 mg/dL (ref <150)
VLDL: 25 mg/dL (ref 0–40)

## 2021-01-24 LAB — BASIC METABOLIC PANEL
Anion gap: 6 (ref 5–15)
BUN: 10 mg/dL (ref 6–20)
CO2: 20 mmol/L — ABNORMAL LOW (ref 22–32)
Calcium: 7.9 mg/dL — ABNORMAL LOW (ref 8.9–10.3)
Chloride: 109 mmol/L (ref 98–111)
Creatinine, Ser: 0.76 mg/dL (ref 0.61–1.24)
GFR, Estimated: >60 mL/min (ref >60)
Glucose, Bld: 108 mg/dL — ABNORMAL HIGH (ref 70–99)
Potassium: 3.5 mmol/L (ref 3.5–5.1)
Sodium: 135 mmol/L (ref 135–145)

## 2021-01-24 LAB — CBC
HCT: 39.9 % (ref 39.0–52.0)
Hemoglobin: 13.3 g/dL (ref 13.0–17.0)
MCH: 31.3 pg (ref 26.0–34.0)
MCHC: 33.3 g/dL (ref 30.0–36.0)
MCV: 93.9 fL (ref 80.0–100.0)
Platelets: 259 10*3/uL (ref 150–400)
RBC: 4.25 MIL/uL (ref 4.22–5.81)
RDW: 15.9 % — ABNORMAL HIGH (ref 11.5–15.5)
WBC: 6.3 10*3/uL (ref 4.0–10.5)
nRBC: 0 % (ref 0.0–0.2)

## 2021-01-24 MED ORDER — ASPIRIN 81 MG PO TABS: 81.0000 mg | ORAL_TABLET | Freq: Every day | ORAL | 0 refills | Status: DC

## 2021-01-24 MED ORDER — HYDROCODONE-ACETAMINOPHEN 5-325 MG PO TABS: 1.0000 | ORAL_TABLET | Freq: Four times a day (QID) | ORAL | 0 refills | Status: AC | PRN

## 2021-01-24 MED ORDER — CLOTRIMAZOLE 1 % EX CREA: TOPICAL_CREAM | Freq: Two times a day (BID) | CUTANEOUS | 0 refills | Status: DC

## 2021-01-24 MED ORDER — ROSUVASTATIN CALCIUM 20 MG PO TABS: 20.0000 mg | ORAL_TABLET | Freq: Every day | ORAL | 11 refills | Status: DC

## 2021-01-24 MED FILL — HYDROCODON-APAP 5-325: 5-325 | 3 days supply | Qty: 12 | Fill #0

## 2021-01-24 MED FILL — ROSUVASTATIN CALCIUM 20 MG: 20 | 30 days supply | Qty: 30 | Fill #0

## 2021-01-24 MED FILL — ASPIRIN LOW DOSE 81 MG TBEC: 81 | 30 days supply | Qty: 30 | Fill #0

## 2021-01-24 MED FILL — SM ANTIFUNGAL 1% CREAM: 1 | 10 days supply | Qty: 30 | Fill #0

## 2021-01-24 NOTE — H&P (View-Only) (Signed)
   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  He has occlusion of the left superficial femoral artery with reconstitution of the peroneal artery only.  His only option for revascularization would be a superficial femoral artery to peroneal artery bypass.  He was not a candidate for endovascular revascularization.  I think without revascularization the wound is unlikely to heal and he would likely require below the knee amputation.  Therefore I recommended bypass and I have discussed the indications for the procedure and the potential complications with the patient.  He does appear to have a reasonable vein on the left for bypass hopefully this can be done with a autogenous graft.  There is no way to get him on the schedule for tomorrow as the schedule is full.  Therefore, from my standpoint he can be discharged and I will arrange for him to come back for his bypass on 02/05/21.   He has had the wound on the left fourth toe for a month now.  I have ordered a Darco shoe and will have physical therapy work with him to use the Darco shoe.  As soon as he is learn to do that he could be discharged from my standpoint.  Currently the toe does not look infected and I do not think that he necessarily needs antibiotics.  SUBJECTIVE:   This pain seems to be under much better control.  PHYSICAL EXAM:   Vitals:   01/23/21 1218 01/23/21 1245 01/23/21 2028 01/24/21 0435  BP: (!) 138/99 (!) 132/94 100/77 114/83  Pulse: 91 90 95 93  Resp: (!) 5 12 16 16  Temp:  97.9 F (36.6 C) 97.8 F (36.6 C) 98.2 F (36.8 C)  TempSrc:  Oral Oral Oral  SpO2: 96% 93% 98% 94%  Weight:    75.6 kg  Height:    5' 5" (1.651 m)   No hematoma right groin. No change in dry gangrene left fourth toe and left great toe  LABS:   Lab Results  Component Value Date   WBC 6.3 01/24/2021   HGB 13.3 01/24/2021   HCT 39.9  01/24/2021   MCV 93.9 01/24/2021   PLT 259 01/24/2021   Lab Results  Component Value Date   CREATININE 0.76 01/24/2021   Lab Results  Component Value Date   INR 1.09 02/22/2016   PROBLEM LIST:    Principal Problem:   Toe ulcer, left, with unspecified severity (HCC) Active Problems:   COVID toes   Tinea pedis   Tobacco dependence   Vaping nicotine dependence, non-tobacco product   Low BP   Hypokalemia   Maceration of skin   Elevated sed rate   CURRENT MEDS:   . aluminum sulfate-calcium acetate  1 packet Topical TID  . aspirin EC  81 mg Oral Daily  . clotrimazole   Topical BID  . collagenase   Topical Daily  . heparin  5,000 Units Subcutaneous Q8H  . nicotine  21 mg Transdermal Daily    Evan Rodriguez Office: [phone] 01/24/2021  

## 2021-01-24 NOTE — Plan of Care (Signed)
  Problem: Education: Goal: Knowledge of General Education information will improve Description: Including pain rating scale, medication(s)/side effects and non-pharmacologic comfort measures 01/24/2021 1253 by Durward Fortes, RN Outcome: Adequate for Discharge 01/24/2021 0808 by Durward Fortes, RN Outcome: Progressing   Problem: Health Behavior/Discharge Planning: Goal: Ability to manage health-related needs will improve 01/24/2021 1253 by Durward Fortes, RN Outcome: Adequate for Discharge 01/24/2021 0808 by Durward Fortes, RN Outcome: Progressing   Problem: Clinical Measurements: Goal: Ability to maintain clinical measurements within normal limits will improve 01/24/2021 1253 by Durward Fortes, RN Outcome: Adequate for Discharge 01/24/2021 0808 by Durward Fortes, RN Outcome: Progressing Goal: Will remain free from infection 01/24/2021 1253 by Durward Fortes, RN Outcome: Adequate for Discharge 01/24/2021 [phone] by Durward Fortes, RN Outcome: Progressing Goal: Diagnostic test results will improve 01/24/2021 1253 by Durward Fortes, RN Outcome: Adequate for Discharge 01/24/[phone] by Durward Fortes, RN Outcome: Progressing Goal: Respiratory complications will improve 01/24/2021 1253 by Durward Fortes, RN Outcome: Adequate for Discharge 01/24/2021 [phone] by Durward Fortes, RN Outcome: Progressing Goal: Cardiovascular complication will be avoided 01/24/2021 1253 by Durward Fortes, RN Outcome: Adequate for Discharge 01/24/2021 [phone] by Durward Fortes, RN Outcome: Progressing   Problem: Activity: Goal: Risk for activity intolerance will decrease 01/24/2021 1253 by Durward Fortes, RN Outcome: Adequate for Discharge 01/24/2021 [phone] by Durward Fortes, RN Outcome: Progressing   Problem: Nutrition: Goal: Adequate nutrition will be maintained 01/24/2021 1253 by Durward Fortes, RN Outcome: Adequate for Discharge 01/24/2021 [phone] by Durward Fortes, RN Outcome: Progressing   Problem:  Coping: Goal: Level of anxiety will decrease 01/24/2021 1253 by Durward Fortes, RN Outcome: Adequate for Discharge 01/24/2021 0808 by Durward Fortes, RN Outcome: Progressing   Problem: Elimination: Goal: Will not experience complications related to bowel motility 01/24/2021 1253 by Durward Fortes, RN Outcome: Adequate for Discharge 01/24/2021 0808 by Durward Fortes, RN Outcome: Progressing Goal: Will not experience complications related to urinary retention 01/24/2021 1253 by Durward Fortes, RN Outcome: Adequate for Discharge 01/24/2021 0808 by Durward Fortes, RN Outcome: Progressing   Problem: Pain Managment: Goal: General experience of comfort will improve 01/24/2021 1253 by Durward Fortes, RN Outcome: Adequate for Discharge 01/24/2021 0808 by Durward Fortes, RN Outcome: Progressing   Problem: Safety: Goal: Ability to remain free from injury will improve 01/24/2021 1253 by Durward Fortes, RN Outcome: Adequate for Discharge 01/24/2021 0808 by Durward Fortes, RN Outcome: Progressing   Problem: Skin Integrity: Goal: Risk for impaired skin integrity will decrease 01/24/2021 1253 by Durward Fortes, RN Outcome: Adequate for Discharge 01/24/2021 0808 by Durward Fortes, RN Outcome: Progressing

## 2021-01-24 NOTE — Discharge Instructions (Signed)
  [facility]  Discharge Instructions  Lower Extremity Angiogram; Angioplasty/Stenting  Please refer to the following instructions for your post-procedure care. Your surgeon or physician assistant will discuss any changes with you.  Activity  Avoid lifting more than 8 pounds (1 gallons of milk) for 5 days after your procedure. You may walk as much as you can tolerate. It's OK to drive after 72 hours.  Bathing/Showering  You may shower the day after your procedure. If you have a bandage, you may remove it at 24- 48 hours. Clean your incision site with mild soap and water. Pat the area dry with a clean towel.  Diet  Resume your pre-procedure diet. There are no special food restrictions following this procedure. All patients with peripheral vascular disease should follow a low fat/low cholesterol diet. In order to heal from your surgery, it is CRITICAL to get adequate nutrition. Your body requires vitamins, minerals, and protein. Vegetables are the best source of vitamins and minerals. Vegetables also provide the perfect balance of protein. Processed food has little nutritional value, so try to avoid this.  Medications  Resume taking all of your medications unless your doctor tells you not to. If your incision is causing pain, you may take over-the-counter pain relievers such as acetaminophen (Tylenol)  Follow Up  Follow up will be arranged at the time of your procedure. You may have an office visit scheduled or may be scheduled for surgery. Ask your surgeon if you have any questions.  Please call us immediately for any of the following conditions: .Severe or worsening pain your legs or feet at rest or with walking. .Increased pain, redness, drainage at your groin puncture site. .Fever of 101 degrees or higher. .If you have any mild or slow bleeding from your puncture site: lie down, apply firm constant pressure over the area with a piece of gauze or a  clean wash cloth for 30 minutes- no peeking!, call 911 right away if you are still bleeding after 30 minutes, or if the bleeding is heavy and unmanageable.  Reduce your risk factors of vascular disease:  . Stop smoking. If you would like help call [facility] at 1-800-QUIT-NOW (1-800-784-8669) or [place] at 336-586-4000. . Manage your cholesterol . Maintain a desired weight . Control your diabetes . Keep your blood pressure down .  If you have any questions, please call the office at [phone] 

## 2021-01-24 NOTE — Plan of Care (Signed)

## 2021-01-24 NOTE — Progress Notes (Signed)
   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  He has occlusion of the left superficial femoral artery with reconstitution of the peroneal artery only.  His only option for revascularization would be a superficial femoral artery to peroneal artery bypass.  He was not a candidate for endovascular revascularization.  I think without revascularization the wound is unlikely to heal and he would likely require below the knee amputation.  Therefore I recommended bypass and I have discussed the indications for the procedure and the potential complications with the patient.  He does appear to have a reasonable vein on the left for bypass hopefully this can be done with a autogenous graft.  There is no way to get him on the schedule for tomorrow as the schedule is full.  Therefore, from my standpoint he can be discharged and I will arrange for him to come back for his bypass on 02/05/21.   He has had the wound on the left fourth toe for a month now.  I have ordered a Darco shoe and will have physical therapy work with him to use the Darco shoe.  As soon as he is learn to do that he could be discharged from my standpoint.  Currently the toe does not look infected and I do not think that he necessarily needs antibiotics.  SUBJECTIVE:   This pain seems to be under much better control.  PHYSICAL EXAM:   Vitals:   01/23/21 1218 01/23/21 1245 01/23/21 2028 01/24/21 0435  BP: (!) 138/99 (!) 132/94 100/77 114/83  Pulse: 91 90 95 93  Resp: (!) 5 12 16 16   Temp:  97.9 F (36.6 C) 97.8 F (36.6 C) 98.2 F (36.8 C)  TempSrc:  Oral Oral Oral  SpO2: 96% 93% 98% 94%  Weight:    75.6 kg  Height:    5\' 5"  (1.651 m)   No hematoma right groin. No change in dry gangrene left fourth toe and left great toe  LABS:   Lab Results  Component Value Date   WBC 6.3 01/24/2021   HGB 13.3 01/24/2021   HCT 39.9  01/24/2021   MCV 93.9 01/24/2021   PLT 259 01/24/2021   Lab Results  Component Value Date   CREATININE 0.76 01/24/2021   Lab Results  Component Value Date   INR 1.09 02/22/2016   PROBLEM LIST:    Principal Problem:   Toe ulcer, left, with unspecified severity (HCC) Active Problems:   COVID toes   Tinea pedis   Tobacco dependence   Vaping nicotine dependence, non-tobacco product   Low BP   Hypokalemia   Maceration of skin   Elevated sed rate   CURRENT MEDS:   . aluminum sulfate-calcium acetate  1 packet Topical TID  . aspirin EC  81 mg Oral Daily  . clotrimazole   Topical BID  . collagenase   Topical Daily  . heparin  5,000 Units Subcutaneous Q8H  . nicotine  21 mg Transdermal Daily    01/26/2021 Office: [phone] 01/24/2021

## 2021-01-24 NOTE — Progress Notes (Signed)
Physical Therapy Treatment & Discharge Patient Details Name: Evan Rodriguez MRN: [id number] DOB: 1976-08-02 Today's Date: 01/24/2021    History of Present Illness Pt is a 45 y.o. male admitted/01/21/21 with worsening L fourth toe ulceration. S/p LLE angiogram, aortogram 3/23. Plan to return to hospital for bypass on 02/05/21. PMH includes tobacco use, NSTEMI s/p PCI, COVID.   PT Comments    Pt progressing well with mobility. Tolerated transfer and gait training with RW; pt with good ability to maintain WB through L heel in [place]. Educ re: precautions, positioning, edema control, DVT prevention, importance of mobility/ROM (HEP handout provided). Pt reports no further questions or concerns. Pt d/c from acute PT services.   Follow Up Recommendations  No PT follow up     Equipment Recommendations  Rolling walker with 5" wheels    Recommendations for Other Services       Precautions / Restrictions Precautions Precautions: Fall Restrictions Weight Bearing Restrictions: Yes LLE Weight Bearing: Partial weight bearing LLE Partial Weight Bearing Percentage or Pounds: LLE WB thru heel in darco shoe    Mobility  Bed Mobility Overal bed mobility: Modified Independent                  Transfers Overall transfer level: Modified independent Equipment used: Rolling walker (2 wheeled) Transfers: Sit to/from Stand           General transfer comment: Multiple sit<>stands from EOB and recliner to RW with darco shoe donned; good hand placement and technique  Ambulation/Gait Ambulation/Gait assistance: Modified independent (Device/Increase time) Gait Distance (Feet): 140 Feet Assistive device: Rolling walker (2 wheeled) Gait Pattern/deviations: Step-through pattern;Decreased stride length;Decreased weight shift to left;Antalgic Gait velocity: Decreased   General Gait Details: Slow, steady gait with darco shoe donned, mod indep with RW; good ability to maintain WB thru heel with  darco, no overt instability or LOB noted; cues to maintain upright posture with RW   Stairs             Wheelchair Mobility    Modified Rankin (Stroke Patients Only)       Balance Overall balance assessment: Modified Independent Sitting-balance support: No upper extremity supported Sitting balance-Leahy Scale: Good Sitting balance - Comments: Able to don boxer shorts sitting EOB   Standing balance support: No upper extremity supported;During functional activity;Bilateral upper extremity supported Standing balance-Leahy Scale: Fair Standing balance comment: Can static stand without UE support; reliant on UE support to take steps in darco shoe                            Cognition Arousal/Alertness: Awake/alert Behavior During Therapy: Flat affect Overall Cognitive Status: Within Functional Limits for tasks assessed                                 General Comments: Improved affect; WFL for simple tasks, not formally assessed      Exercises Other Exercises Other Exercises: [place] HEP handout provided (Access Code [email or web address]) - ankle pumps, LAQ, seated marching, SLR    General Comments General comments (skin integrity, edema, etc.): Pt works as Administrator, 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, all therex can be performed in truck as well (moving legs every  hour)      Pertinent Vitals/Pain Pain Assessment: Faces Faces Pain Scale: Hurts little more Pain Location: L foot Pain Descriptors / Indicators: Grimacing;Guarding Pain Intervention(s): Monitored during session;Limited activity within patient's tolerance;Repositioned    Home Living                      Prior Function            PT Goals (current goals can now be found in the care plan  section) Acute Rehab PT Goals Patient Stated Goal: Home today Progress towards PT goals: Goals met/education completed, patient discharged from PT    Frequency    Min 3X/week      PT Plan Current plan remains appropriate    Co-evaluation              AM-PAC PT "6 Clicks" Mobility   Outcome Measure  Help needed turning from your back to your side while in a flat bed without using bedrails?: None Help needed moving from lying on your back to sitting on the side of a flat bed without using bedrails?: None Help needed moving to and from a bed to a chair (including a wheelchair)?: None Help needed standing up from a chair using your arms (e.g., wheelchair or bedside chair)?: None Help needed to walk in hospital room?: None Help needed climbing 3-5 steps with a railing? : A Little 6 Click Score: 23    End of Session   Activity Tolerance: Patient tolerated treatment well Patient left: in chair;with call bell/phone within reach (RN ok with no chair alarm) Nurse Communication: Mobility status PT Visit Diagnosis: Unsteadiness on feet (R26.81);Pain Pain - Right/Left: Left Pain - part of body: Ankle and joints of foot     Time: 2952-8413 PT Time Calculation (min) (ACUTE ONLY): 20 min  Charges:  $Gait Training: 8-22 mins                     Mabeline Caras, PT, DPT Acute Rehabilitation Services  Pager [phone] Office [place] 01/24/2021, 12:40 PM

## 2021-01-24 NOTE — Discharge Summary (Signed)
[facility] Discharge Summary  Patient name: Domenico Achord Medical record number: [id number] Date of birth: 12/12/1975 Age: 45 y.o. Gender: male Date of Admission: 01/21/2021  Date of Discharge: 01/24/21 Admitting Physician: Blane Ohara McDiarmid, MD  Primary Care Provider: Daisy Floro, DO Consultants: [facility]  Indication for Hospitalization: Toe pain and ulceration   Discharge Diagnoses/Problem List:  Acute limb ischemia Dyslipidemia   Disposition: Home  Discharge Condition: Stable   Discharge Exam:  Temp:  [97.8 F (36.6 C)-98.2 F (36.8 C)] 98.2 F (36.8 C) (03/24 0435) Pulse Rate:  [93-95] 93 (03/24 0435) Resp:  [16] 16 (03/24 0435) BP: (100-114)/(77-83) 114/83 (03/24 0435) SpO2:  [94 %-98 %] 94 % (03/24 0435) Weight:  [75.6 kg] 75.6 kg (03/24 0435)  Physical Exam: General: alert, NAD Cardiovascular: RRR no murmurs Respiratory: CTAB normal WOB Abdomen: soft, non distended, non tender Extremities: warm, dry. L foot with wound dressing in place without drainage or surrounding erythema   Brief Hospital Course:   Evan Rodriguez is a 45 y.o. male presenting with left fourth toe pain and ulceration since January and was found to have Acute Limb Ischemia . PMH is significant for previous NSTEMI with coronary stent, dyslipidemia and tobacco abuse.   Left Fourth Toe Ulceration  Acute Limb Ischemia Patient was tachycardic on arrival to ED at 105 bpm, otherwise afebrile and hemodynamically stable. Reassuringly no leukocytosis and normal CRP (reported), however with mildly elevated ESR 35. Exam with 4-5 second cap refill on left foot, 3-4 seconds on right. LLE also slightly cooler to the touch and with faint distal pulses compared to right.Vascular ultrasound showing critical limb ischemia. Arteriogram showed L popliteal occlusion, with robust para-geniculate collaterals. Patient will have femoral peroneal bypass which is scheduled outpatient on 4/5.   Darco shoe was ordered and PT showed him how to use it. His pain was managed with Norco and Tylenol as needed. He was started on Crestor 20m prior to discharge. Patient remained afebrile and hemodynamically stable.  All other conditions chronic and stable    Issues for Follow Up:  1. Outpatient femoral bypass scheduled for 4/5 2. Follow up with PCP- Crestor 236mstarted. Also need tobacco cessation counseling and other health maintenance items   Significant Procedures: Aortogram   Significant Labs and Imaging:  Recent Labs  Lab 01/21/21 1531 01/23/21 0302 01/24/21 0236  WBC 8.4 6.7 6.3  HGB 15.6 14.5 13.3  HCT 45.9 42.3 39.9  PLT  --  264 259   Recent Labs  Lab 01/21/21 1531 01/23/21 0302 01/24/21 0236  NA 142 137 135  K 4.0 3.4* 3.5  CL 109* 107 109  CO2 17* 24 20*  GLUCOSE 100* 124* 108*  BUN 14 19 10   CREATININE 0.95 0.92 0.76  CALCIUM 9.2 8.3* 7.9*  ALKPHOS 81  --   --   AST 21  --   --   ALT 27  --   --   ALBUMIN 4.4  --   --      Results/Tests Pending at Time of Discharge: None  Discharge Medications:  Allergies as of 01/24/2021   No Known Allergies     Medication List    TAKE these medications   aspirin 81 MG tablet Take 1 tablet (81 mg total) by mouth daily.   clotrimazole 1 % cream Commonly known as: LOTRIMIN Apply topically 2 (two) times daily.   HYDROcodone-acetaminophen 5-325 MG tablet Commonly known as: NORCO/VICODIN Take 1 tablet by mouth every 6 (six)  hours as needed for up to 3 days for moderate pain.   rosuvastatin 20 MG tablet Commonly known as: Crestor Take 1 tablet (20 mg total) by mouth daily.       Discharge Instructions: Please refer to Patient Instructions section of EMR for full details.  Patient was counseled important signs and symptoms that should prompt return to medical care, changes in medications, dietary instructions, activity restrictions, and follow up appointments.   Follow-Up Appointments:  Follow-up  Information    Daisy Floro, DO. Go to.   Specialty: Family Medicine Why: appointment on 01/28/21 at 8:30am Contact information: [address] [place] [address] [place], [place], DO 01/24/2021, 1:11 PM PGY-1, [place]

## 2021-01-27 ENCOUNTER — Telehealth: Payer: Self-pay | Admitting: Family Medicine

## 2021-01-27 NOTE — Telephone Encounter (Signed)
**  AFTER HOURS CALL**  Patient called to confirm appointment. It is tomorrow, 3/28 at 8:30 AM in ATC.  Shirlean Mylar, MD [facility] Family Medicine Residency, PGY-2

## 2021-01-28 ENCOUNTER — Ambulatory Visit: Payer: 59

## 2021-01-28 NOTE — Patient Instructions (Signed)
It was great to see you! Thank you for allowing me to participate in your care!  I recommend that you always bring your medications to each appointment as this makes it easy to ensure we are on the correct medications and helps [place] not miss when refills are needed.  Our plans for today:  -I will place the FMLA paperwork in Dr. Ewell Poe box for her to complete -I would like for you to keep your appointment on 4/5 for vascular provider -I do recommend you schedule follow-up appointment in about 2 to 4 weeks to discuss quitting smoking if you are interested, I think this would be the best thing we can do right now for your health.   Take care and seek immediate care sooner if you develop any concerns.   Dr. Jackelyn Poling, DO [facility]

## 2021-01-28 NOTE — Telephone Encounter (Signed)
Patient needs refill of his pain medicine had appt today had to reschedule per doctor due to patient being over 15 min late.  Name of meds is hydrocodone 5-325 mg tab.  [facility] at [employer] any questions contact patient . Also rescheduled his appt to tomorrow morning03/29/22

## 2021-01-28 NOTE — Progress Notes (Signed)
    SUBJECTIVE:   CHIEF COMPLAINT / HPI:   Hospital follow-up-critical limb ischemia: Patient is a 45 year old male who presents today for hospital follow-up after being discharged for critical limb ischemia.  Patient was admitted to the hospital on 3/21/2 822 with her pain and ulceration of his left foot.  He has a scheduled femoral peroneal bypass which is currently scheduled for 4/5.  During the hospitalization he was started on Crestor 20 mg/day.  Discharge summary recommends: -Scheduled femoral bypass on 4/5 -Follow-up to ensure patient is tolerating his Crestor 20 mg well -Recommend tobacco cessation counseling  Current smoker: Patient is a current smoker.  He currently smokes about 2 packs per day. He has been smoking for about 20 years. He states he is not interested in discussing cutting down at this time but maybe in the future.  PERTINENT  PMH / PSH: Current smoker  OBJECTIVE:   BP 110/60   Pulse 98   Ht 5\' 6"  (1.676 m)   Wt 163 lb 6 oz (74.1 kg)   SpO2 98%   BMI 26.37 kg/m    General: NAD, pleasant, able to participate in exam Cardiac: RRR, no murmurs. Respiratory: CTAB, normal effort Lower extremity/skin: Left great toe with ulceration but no obvious necrosis, some granulation tissue. Faint peripheral pulses noted in left foot. Psych: Normal affect and mood  ASSESSMENT/PLAN:   Tobacco dependence Assessment: 45 year old male who smokes about 2 packs/day and has done so for about 20 years.  Patient is not currently interested in discussing smoking cessation but did talk about the options we can use in the future.  I gave him the option of schedule a follow-up appointment to discuss this further and he is considering it.  Critical lower limb ischemia [facility]) Assessment: 45 year old male presenting for hospital follow-up with ischemia present in the left great toe.  Patient has a scheduled bypass on 4/5.  Physical exam today shows no worsening necrosis compared to the  previous documentation.  Patient endorses decent pain control at this time. Plan: -Patient will keep his appointment on 4/5 for his femoral bypass -Patient will let 6/5 know if he develops any other symptoms in the meantime   [place], DO [facility]    This note was prepared using Dragon voice recognition software and may include unintentional dictation errors due to the inherent limitations of voice recognition software.

## 2021-01-29 ENCOUNTER — Ambulatory Visit: Payer: 59 | Admitting: Family Medicine

## 2021-01-29 ENCOUNTER — Other Ambulatory Visit: Payer: Self-pay

## 2021-01-29 DIAGNOSIS — I70229 Atherosclerosis of native arteries of extremities with rest pain, unspecified extremity: Secondary | ICD-10-CM

## 2021-01-29 DIAGNOSIS — F17200 Nicotine dependence, unspecified, uncomplicated: Secondary | ICD-10-CM

## 2021-01-29 NOTE — Assessment & Plan Note (Signed)
Assessment: 45 year old male presenting for hospital follow-up with ischemia present in the left great toe.  Patient has a scheduled bypass on 4/5.  Physical exam today shows no worsening necrosis compared to the previous documentation.  Patient endorses decent pain control at this time. Plan: -Patient will keep his appointment on 4/5 for his femoral bypass -Patient will let us know if he develops any other symptoms in the meantime

## 2021-01-29 NOTE — Assessment & Plan Note (Signed)
Assessment: 45 year old male who smokes about 2 packs/day and has done so for about 20 years.  Patient is not currently interested in discussing smoking cessation but did talk about the options we can use in the future.  I gave him the option of schedule a follow-up appointment to discuss this further and he is considering it.

## 2021-02-02 NOTE — Pre-Procedure Instructions (Signed)
Evan Rodriguez  02/02/2021      [id number] - [place] ([place]), [place] - [address] [place] ([place]) [place] [address] Phone: [phone] Fax: [phone]  [facility] [address] [place] [address] Phone: [phone] Fax: [phone]    Your procedure is scheduled on April 5  Report to [facility] Entrance A at 5:30 A.M.  Call this number if you have problems the morning of surgery:  [phone]               If any questions prior to surgery call [phone] Monday-Friday 8 am- 4 pm   Remember:  Do not eat or drink after midnight.      Take these medicines the morning of surgery with A SIP OF WATER :              Aspirin             Rosuvastatin (crestor)              7 days prior to surgery STOP taking  Aleve, Naproxen, Ibuprofen, Motrin, Advil, Goody's, BC's, all herbal medications, fish oil, and all vitamins.    Do not wear jewelry.  Do not wear lotions, powders, or perfumes, or deodorant.  Do not shave 48 hours prior to surgery.  Men may shave face and neck.  Do not bring valuables to the hospital.  [facility] is not responsible for any belongings or valuables.  Contacts, dentures or bridgework may not be worn into surgery.  Leave your suitcase in the car.  After surgery it may be brought to your room.  For patients admitted to the hospital, discharge time will be determined by your treatment team.  Patients discharged the day of surgery will not be allowed to drive home.    Special instructions:  [facility]- Preparing For Surgery  Before surgery, you can play an important role. Because skin is not sterile, your skin needs to be as free of germs as possible. You can reduce the number of germs on your skin by washing with CHG (chlorahexidine gluconate) Soap before surgery.  CHG is an antiseptic cleaner which kills germs and bonds with the skin to continue killing germs even after washing.     Oral Hygiene is also important to reduce your risk of infection.  Remember - BRUSH YOUR TEETH THE MORNING OF SURGERY WITH YOUR REGULAR TOOTHPASTE  Please do not use if you have an allergy to CHG or antibacterial soaps. If your skin becomes reddened/irritated stop using the CHG.  Do not shave (including legs and underarms) for at least 48 hours prior to first CHG shower. It is OK to shave your face.  Please follow these instructions carefully.   1. Shower the NIGHT BEFORE SURGERY and the MORNING OF SURGERY with CHG.   2. If you chose to wash your hair, wash your hair first as usual with your normal shampoo.  3. After you shampoo, rinse your hair and body thoroughly to remove the shampoo.  4. Use CHG as you would any other liquid soap. You can apply CHG directly to the skin and wash gently with a scrungie or a clean washcloth.   5. Apply the CHG Soap to your body ONLY FROM THE NECK DOWN.  Do not use on open wounds or open sores. Avoid contact with your eyes, ears, mouth and genitals (private parts). Wash Face and genitals (private parts)  with  your normal soap.  6. Wash thoroughly, paying special attention to the area where your surgery will be performed.  7. Thoroughly rinse your body with warm water from the neck down.  8. DO NOT shower/wash with your normal soap after using and rinsing off the CHG Soap.  9. Pat yourself dry with a CLEAN TOWEL.  10. Wear CLEAN PAJAMAS to bed the night before surgery, wear comfortable clothes the morning of surgery  11. Place CLEAN SHEETS on your bed the night of your first shower and DO NOT SLEEP WITH PETS.    Day of Surgery:  Do not apply any deodorants/lotions.  Please wear clean clothes to the hospital/surgery center.   Remember to brush your teeth WITH YOUR REGULAR TOOTHPASTE.    Please read over the following fact sheets that you were given.

## 2021-02-04 ENCOUNTER — Encounter (HOSPITAL_COMMUNITY)
Admission: RE | Admit: 2021-02-04 | Discharge: 2021-02-04 | Disposition: A | Discharge status: Home / Self Care | Payer: 59 | Source: Ambulatory Visit | Attending: Vascular Surgery | Admitting: Vascular Surgery

## 2021-02-04 ENCOUNTER — Other Ambulatory Visit (HOSPITAL_COMMUNITY)
Admission: RE | Admit: 2021-02-04 | Discharge: 2021-02-04 | Disposition: A | Discharge status: Home / Self Care | Payer: 59 | Source: Ambulatory Visit | Attending: Vascular Surgery | Admitting: Vascular Surgery

## 2021-02-04 ENCOUNTER — Encounter (HOSPITAL_COMMUNITY): Payer: Self-pay

## 2021-02-04 ENCOUNTER — Other Ambulatory Visit: Payer: Self-pay

## 2021-02-04 DIAGNOSIS — Z01812 Encounter for preprocedural laboratory examination: Secondary | ICD-10-CM | POA: Insufficient documentation

## 2021-02-04 DIAGNOSIS — I2582 Chronic total occlusion of coronary artery: Secondary | ICD-10-CM | POA: Insufficient documentation

## 2021-02-04 DIAGNOSIS — I2510 Atherosclerotic heart disease of native coronary artery without angina pectoris: Secondary | ICD-10-CM | POA: Insufficient documentation

## 2021-02-04 DIAGNOSIS — Z20822 Contact with and (suspected) exposure to covid-19: Secondary | ICD-10-CM | POA: Insufficient documentation

## 2021-02-04 DIAGNOSIS — I252 Old myocardial infarction: Secondary | ICD-10-CM | POA: Insufficient documentation

## 2021-02-04 DIAGNOSIS — Z955 Presence of coronary angioplasty implant and graft: Secondary | ICD-10-CM | POA: Insufficient documentation

## 2021-02-04 DIAGNOSIS — I70229 Atherosclerosis of native arteries of extremities with rest pain, unspecified extremity: Secondary | ICD-10-CM | POA: Insufficient documentation

## 2021-02-04 LAB — URINALYSIS, ROUTINE W REFLEX MICROSCOPIC
Bilirubin Urine: NEGATIVE
Glucose, UA: NEGATIVE mg/dL
Hgb urine dipstick: NEGATIVE
Ketones, ur: NEGATIVE mg/dL
Leukocytes,Ua: NEGATIVE
Nitrite: NEGATIVE
Protein, ur: NEGATIVE mg/dL
Specific Gravity, Urine: 1.02 (ref 1.005–1.030)
pH: 6 (ref 5.0–8.0)

## 2021-02-04 LAB — CBC
HCT: 45.4 % (ref 39.0–52.0)
Hemoglobin: 14.8 g/dL (ref 13.0–17.0)
MCH: 31.6 pg (ref 26.0–34.0)
MCHC: 32.6 g/dL (ref 30.0–36.0)
MCV: 96.8 fL (ref 80.0–100.0)
Platelets: 285 10*3/uL (ref 150–400)
RBC: 4.69 MIL/uL (ref 4.22–5.81)
RDW: 15.4 % (ref 11.5–15.5)
WBC: 9.2 10*3/uL (ref 4.0–10.5)
nRBC: 0 % (ref 0.0–0.2)

## 2021-02-04 LAB — COMPREHENSIVE METABOLIC PANEL
ALT: 37 U/L (ref 0–44)
AST: 22 U/L (ref 15–41)
Albumin: 3.5 g/dL (ref 3.5–5.0)
Alkaline Phosphatase: 69 U/L (ref 38–126)
Anion gap: 8 (ref 5–15)
BUN: 10 mg/dL (ref 6–20)
CO2: 26 mmol/L (ref 22–32)
Calcium: 9.1 mg/dL (ref 8.9–10.3)
Chloride: 106 mmol/L (ref 98–111)
Creatinine, Ser: 0.88 mg/dL (ref 0.61–1.24)
GFR, Estimated: >60 mL/min (ref >60)
Glucose, Bld: 97 mg/dL (ref 70–99)
Potassium: 4.7 mmol/L (ref 3.5–5.1)
Sodium: 140 mmol/L (ref 135–145)
Total Bilirubin: 0.3 mg/dL (ref 0.3–1.2)
Total Protein: 7.2 g/dL (ref 6.5–8.1)

## 2021-02-04 LAB — TYPE AND SCREEN
ABO/RH(D): A POS
Antibody Screen: NEGATIVE

## 2021-02-04 LAB — SURGICAL PCR SCREEN
MRSA, PCR: NEGATIVE
Staphylococcus aureus: NEGATIVE

## 2021-02-04 LAB — APTT: aPTT: 29 seconds (ref 24–36)

## 2021-02-04 LAB — PROTIME-INR
INR: 1 (ref 0.8–1.2)
Prothrombin Time: 12.9 seconds (ref 11.4–15.2)

## 2021-02-04 LAB — SARS CORONAVIRUS 2 (TAT 6-24 HRS): SARS Coronavirus 2: NEGATIVE

## 2021-02-04 NOTE — Progress Notes (Signed)
PCP - Peggyann Shoals Cardiologist - denies  Chest x-ray - n/a EKG - 01/22/21 Stress Test - denies ECHO - denies Cardiac Cath - 02/22/16  Sleep Study - denies CPAP - denies  Blood Thinner Instructions: n/a Aspirin Instructions: n/a  COVID TEST- 02/04/21-pending. Pt aware to quarantine   Anesthesia review: Yes, pt has hx of cardiac cath with stent placement. Pt has not f/u with cardiologist since then. Pt did complete 1 year of brilinta therapy. Fayrene Fearing, PA-C with anesthesia notified. Dr. Edilia Bo and Dr. Jean Rosenthal (Anesthesiologist) made aware. No new orders at this time. Possible cardiology consult sometime today if available. Pt denies any current cardiac symptoms and feels he is at baseline.    Patient denies shortness of breath, fever, cough and chest pain at PAT appointment   All instructions explained to the patient, with a verbal understanding of the material. Patient agrees to go over the instructions while at home for a better understanding. Patient also instructed to self quarantine after being tested for COVID-19. The opportunity to ask questions was provided.

## 2021-02-04 NOTE — Progress Notes (Signed)
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 He underwent successful PCI + DES to both lesions. There was residual occlusion of the apical LAD. EF was overall preserved.  He was placed on DAPT with ASA and Brilinta and recommended to stay on that for 1 year.  He was recommended to follow-up with Dr. Wyline Mood outpatient.  Unfortunately, the patient reports that he did not follow-up with cardiology and has not had any regular medical follow-up due to insurance issues.  He reports that he did complete 1 year of Brilinta and ASA. Patient denied any angina, denied any shortness of breath, said he felt he was at his baseline.  His functional status is quite low owing to his severe PAD.  I reviewed patient's history Dr. Jean Rosenthal.  She advised the while he will be considered high risk due to known history of CAD and MI and lack of cardiology follow-up, he could likely proceed with surgery as scheduled given critical ischemia as long as he is not experiencing any acute cardiopulmonary symptoms.  She also spoke with Dr. Edilia Bo who felt that the patient's critical limb ischemia did need to be addressed urgently.   Dr. Jean Rosenthal did recommend that I reach out to cardiology to ensure that he is seen in follow-up for his CAD. I spoke with Daun Peacock who advised they will get him in to be seen with [facility] cardiology after his procedure. Patient understands need to followup with cardiology. He understands that he is considered high risk for surgery from a cardiac standpoint due to his history and lack of followup.  Preop labs reviewed, unremarkable.  EKG 01/22/2021: Sinus rhythm rate 98. Atrial premature complex. Borderline short PR interval. Probable anterolateral infarct, old  Cath and PCI 02/22/2016:  Mid Cx lesion, 80% stenosed. Post intervention, there is a 0% residual stenosis.  Dist LAD lesion, 100% stenosed.  Mid LAD  lesion, 80% stenosed. Post intervention, there is a 0% residual stenosis. The lesion was previously treated with a stent (unknown type).  There is mild left ventricular systolic dysfunction.  Mid RCA lesion, 30% stenosed.   1. Severe 2 vessel CAD with successful PCI of the LCx and LAD/diagonal 2. Mild segmental LV contraction abnormality with preserved overall LVEF 3. Reisidual occlusion of the apical LAD  Recommend ASA/Brilinita x 12 months. Aggrastat x 12 hours. DC home Sunday am with continued observation tomorrow because of heavy thrombus burden, need for IIbIIIa inhibitor overnight.   Zannie Cove [facility] Short Stay Center/Anesthesiology Phone [phone] 02/04/2021 12:57 PM

## 2021-02-04 NOTE — Anesthesia Preprocedure Evaluation (Addendum)
Anesthesia Evaluation  Patient identified by MRN, date of birth, ID band Patient awake    Reviewed: Allergy & Precautions, NPO status , Patient's Chart, lab work & pertinent test results  Airway Mallampati: II  TM Distance: >3 FB Neck ROM: Full    Dental  (+) Edentulous Upper, Edentulous Lower Midline tongue ring:   Pulmonary Current Smoker,    Pulmonary exam normal        Cardiovascular + Past MI (NSTEMI 2017), + Cardiac Stents and + Peripheral Vascular Disease   Rhythm:Regular Rate:Normal     Neuro/Psych negative neurological ROS  negative psych ROS   GI/Hepatic negative GI ROS, Neg liver ROS,   Endo/Other  negative endocrine ROS  Renal/GU negative Renal ROS  negative genitourinary   Musculoskeletal negative musculoskeletal ROS (+)   Abdominal (+)  Abdomen: soft. Bowel sounds: normal.  Peds  Hematology negative hematology ROS (+)   Anesthesia Other Findings   Reproductive/Obstetrics                           Anesthesia Physical Anesthesia Plan  ASA: III  Anesthesia Plan: General   Post-op Pain Management:    Induction: Intravenous  PONV Risk Score and Plan: 1 and Ondansetron, Dexamethasone, Midazolam and Treatment may vary due to age or medical condition  Airway Management Planned: Mask and Oral ETT  Additional Equipment: Arterial line  Intra-op Plan:   Post-operative Plan: Extubation in OR  Informed Consent: I have reviewed the patients History and Physical, chart, labs and discussed the procedure including the risks, benefits and alternatives for the proposed anesthesia with the patient or authorized representative who has indicated his/her understanding and acceptance.     Dental advisory given  Plan Discussed with: CRNA  Anesthesia Plan Comments: (Lab Results      Component                Value               Date                      WBC                      9.2                  02/04/2021                HGB                      14.8                02/04/2021                HCT                      45.4                02/04/2021                MCV                      96.8                02/04/2021                PLT  285                 02/04/2021           Lab Results      Component                Value               Date                      NA                       140                 02/04/2021                K                        4.7                 02/04/2021                CO2                      26                  02/04/2021                GLUCOSE                  97                  02/04/2021                BUN                      10                  02/04/2021                CREATININE               0.88                02/04/2021                CALCIUM                  9.1                 02/04/2021                GFRNONAA                 >60                 02/04/2021                GFRAA                    >60                 02/23/2016           PAT note by Antionette Poles, PA-C: History of NSTEMI 2017, 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 He underwent successful PCI + DES to both lesions. There  was residual occlusion of the apical LAD. EF was overall preserved.  He was placed on DAPT with ASA and Brilinta and recommended to stay on that for 1 year.  He was recommended to follow-up with Dr. Wyline Mood outpatient.  Unfortunately, the patient reports that he did not follow-up with cardiology and has not had any regular medical follow-up due to insurance issues.  He reports that he did complete 1 year of Brilinta and ASA. Patient denied any angina, denied any shortness of breath, said he felt he was at his baseline.  His functional status is quite low owing to his severe PAD.  I reviewed patient's history Dr. Jean Rosenthal.  She advised the while he will be considered high risk  due to known history of CAD and MI and lack of cardiology follow-up, he could likely proceed with surgery as scheduled given critical ischemia as long as he is not experiencing any acute cardiopulmonary symptoms.  She also spoke with Dr. Edilia Bo who felt that the patient's critical limb ischemia did need to be addressed urgently.   Dr. Jean Rosenthal did recommend that I reach out to cardiology to ensure that he is seen in follow-up for his CAD. I spoke with Daun Peacock who advised they will get him in to be seen with [facility] cardiology after his procedure. Patient understands need to followup with cardiology. He understands that he is considered high risk for surgery from a cardiac standpoint due to his history and lack of followup.  Preop labs reviewed, unremarkable.  EKG 01/22/2021: Sinus rhythm rate 98. Atrial premature complex. Borderline short PR interval. Probable anterolateral infarct, old  Cath and PCI 02/22/2016: Mid Cx lesion, 80% stenosed. Post intervention, there is a 0% residual stenosis. Dist LAD lesion, 100% stenosed. Mid LAD lesion, 80% stenosed. Post intervention, there is a 0% residual stenosis. The lesion was previously treated with a stent (unknown type). There is mild left ventricular systolic dysfunction. Mid RCA lesion, 30% stenosed.   1. Severe 2 vessel CAD with successful PCI of the LCx and LAD/diagonal 2. Mild segmental LV contraction abnormality with preserved overall LVEF 3. Reisidual occlusion of the apical LAD  Recommend ASA/Brilinita x 12 months. Aggrastat x 12 hours. DC home Sunday am with continued observation tomorrow because of heavy thrombus burden, need for IIbIIIa inhibitor overnight. )      Anesthesia Quick Evaluation

## 2021-02-05 ENCOUNTER — Encounter (HOSPITAL_COMMUNITY): Payer: 59

## 2021-02-05 ENCOUNTER — Inpatient Hospital Stay (HOSPITAL_COMMUNITY)
Admission: RE | Admit: 2021-02-05 | Discharge: 2021-02-07 | DRG: 254 | Disposition: A | Discharge status: Home / Self Care | Payer: 59 | Attending: Vascular Surgery | Admitting: Vascular Surgery

## 2021-02-05 ENCOUNTER — Inpatient Hospital Stay (HOSPITAL_COMMUNITY): Payer: 59 | Admitting: Physician Assistant

## 2021-02-05 ENCOUNTER — Other Ambulatory Visit: Payer: Self-pay

## 2021-02-05 ENCOUNTER — Inpatient Hospital Stay (HOSPITAL_COMMUNITY): Payer: 59

## 2021-02-05 ENCOUNTER — Inpatient Hospital Stay (HOSPITAL_COMMUNITY): Payer: 59 | Admitting: Anesthesiology

## 2021-02-05 ENCOUNTER — Encounter (HOSPITAL_COMMUNITY)
Admission: RE | Disposition: A | Discharge status: Home / Self Care | Payer: Self-pay | Source: Home / Self Care | Attending: Vascular Surgery

## 2021-02-05 ENCOUNTER — Encounter (HOSPITAL_COMMUNITY): Payer: Self-pay | Admitting: Vascular Surgery

## 2021-02-05 DIAGNOSIS — L97529 Non-pressure chronic ulcer of other part of left foot with unspecified severity: Secondary | ICD-10-CM | POA: Diagnosis present

## 2021-02-05 DIAGNOSIS — I739 Peripheral vascular disease, unspecified: Secondary | ICD-10-CM | POA: Diagnosis present

## 2021-02-05 DIAGNOSIS — I2510 Atherosclerotic heart disease of native coronary artery without angina pectoris: Secondary | ICD-10-CM | POA: Diagnosis present

## 2021-02-05 DIAGNOSIS — B353 Tinea pedis: Secondary | ICD-10-CM | POA: Diagnosis present

## 2021-02-05 DIAGNOSIS — E876 Hypokalemia: Secondary | ICD-10-CM | POA: Diagnosis present

## 2021-02-05 DIAGNOSIS — Z8616 Personal history of COVID-19: Secondary | ICD-10-CM | POA: Diagnosis not present

## 2021-02-05 DIAGNOSIS — F17290 Nicotine dependence, other tobacco product, uncomplicated: Secondary | ICD-10-CM | POA: Diagnosis present

## 2021-02-05 DIAGNOSIS — Z7982 Long term (current) use of aspirin: Secondary | ICD-10-CM

## 2021-02-05 DIAGNOSIS — Z79899 Other long term (current) drug therapy: Secondary | ICD-10-CM

## 2021-02-05 DIAGNOSIS — Z955 Presence of coronary angioplasty implant and graft: Secondary | ICD-10-CM

## 2021-02-05 DIAGNOSIS — I70262 Atherosclerosis of native arteries of extremities with gangrene, left leg: Secondary | ICD-10-CM | POA: Diagnosis present

## 2021-02-05 DIAGNOSIS — Z419 Encounter for procedure for purposes other than remedying health state, unspecified: Principal | ICD-10-CM

## 2021-02-05 DIAGNOSIS — I998 Other disorder of circulatory system: Secondary | ICD-10-CM | POA: Diagnosis not present

## 2021-02-05 DIAGNOSIS — I252 Old myocardial infarction: Secondary | ICD-10-CM | POA: Diagnosis not present

## 2021-02-05 HISTORY — PX: INTRAOPERATIVE ARTERIOGRAM: SHX5157

## 2021-02-05 HISTORY — DX: Atherosclerotic heart disease of native coronary artery without angina pectoris: I25.10

## 2021-02-05 HISTORY — PX: BYPASS GRAFT FEMORAL-PERONEAL: SHX5762

## 2021-02-05 LAB — CBC
HCT: 38.4 % — ABNORMAL LOW (ref 39.0–52.0)
Hemoglobin: 12.7 g/dL — ABNORMAL LOW (ref 13.0–17.0)
MCH: 31.8 pg (ref 26.0–34.0)
MCHC: 33.1 g/dL (ref 30.0–36.0)
MCV: 96 fL (ref 80.0–100.0)
Platelets: 263 10*3/uL (ref 150–400)
RBC: 4 MIL/uL — ABNORMAL LOW (ref 4.22–5.81)
RDW: 15.3 % (ref 11.5–15.5)
WBC: 13.5 10*3/uL — ABNORMAL HIGH (ref 4.0–10.5)
nRBC: 0 % (ref 0.0–0.2)

## 2021-02-05 LAB — ABO/RH: ABO/RH(D): A POS

## 2021-02-05 SURGERY — CREATION, BYPASS, ARTERIAL, FEMORAL TO PERONEAL, USING GRAFT
Anesthesia: General | Site: Leg Upper | Laterality: Left

## 2021-02-05 MED ORDER — BISACODYL 10 MG RE SUPP: 10.0000 mg | Freq: Every day | RECTAL | Status: DC | PRN

## 2021-02-05 MED ORDER — HEPARIN (PORCINE) 25000 UT/250ML-% IV SOLN
400.0000 [IU]/h | INTRAVENOUS | Status: DC
  Administered 2021-02-05: 400 [IU]/h via INTRAVENOUS
  Filled 2021-02-05: qty 250

## 2021-02-05 MED ORDER — CEFAZOLIN SODIUM-DEXTROSE 2-4 GM/100ML-% IV SOLN
2.0000 g | INTRAVENOUS | Status: AC
  Administered 2021-02-05 (×2): 2 g via INTRAVENOUS

## 2021-02-05 MED ORDER — SODIUM CHLORIDE 0.9 % IV SOLN
INTRAVENOUS | Status: DC | PRN
  Administered 2021-02-05 (×2): 500 mL

## 2021-02-05 MED ORDER — DEXAMETHASONE SODIUM PHOSPHATE 10 MG/ML IJ SOLN
INTRAMUSCULAR | Status: DC | PRN
  Administered 2021-02-05: 10 mg via INTRAVENOUS

## 2021-02-05 MED ORDER — LABETALOL HCL 5 MG/ML IV SOLN: 10.0000 mg | INTRAVENOUS | Status: DC | PRN

## 2021-02-05 MED ORDER — CHLORHEXIDINE GLUCONATE CLOTH 2 % EX PADS: 6.0000 | MEDICATED_PAD | Freq: Once | CUTANEOUS | Status: DC

## 2021-02-05 MED ORDER — OXYCODONE HCL 5 MG/5ML PO SOLN: 5.0000 mg | Freq: Once | ORAL | Status: DC | PRN

## 2021-02-05 MED ORDER — ALBUMIN HUMAN 5 % IV SOLN: INTRAVENOUS | Status: DC | PRN

## 2021-02-05 MED ORDER — PANTOPRAZOLE SODIUM 40 MG PO TBEC
40.0000 mg | DELAYED_RELEASE_TABLET | Freq: Every day | ORAL | Status: DC
  Administered 2021-02-06: 40 mg via ORAL
  Filled 2021-02-05 (×2): qty 1

## 2021-02-05 MED ORDER — METOPROLOL TARTRATE 5 MG/5ML IV SOLN: 2.0000 mg | INTRAVENOUS | Status: DC | PRN

## 2021-02-05 MED ORDER — SODIUM CHLORIDE 0.9% FLUSH: 9.0000 mL | INTRAVENOUS | Status: DC | PRN

## 2021-02-05 MED ORDER — ACETAMINOPHEN 325 MG PO TABS
325.0000 mg | ORAL_TABLET | ORAL | Status: DC | PRN
  Administered 2021-02-07: 650 mg via ORAL
  Filled 2021-02-05: qty 2

## 2021-02-05 MED ORDER — ONDANSETRON HCL 4 MG/2ML IJ SOLN
INTRAMUSCULAR | Status: DC | PRN
  Administered 2021-02-05: 4 mg via INTRAVENOUS

## 2021-02-05 MED ORDER — NALOXONE HCL 0.4 MG/ML IJ SOLN: 0.4000 mg | INTRAMUSCULAR | Status: DC | PRN

## 2021-02-05 MED ORDER — HEPARIN SODIUM (PORCINE) 1000 UNIT/ML IJ SOLN
INTRAMUSCULAR | Status: AC
  Filled 2021-02-05: qty 1

## 2021-02-05 MED ORDER — SUFENTANIL CITRATE 50 MCG/ML IV SOLN
INTRAVENOUS | Status: AC
  Filled 2021-02-05: qty 1

## 2021-02-05 MED ORDER — SODIUM CHLORIDE (PF) 0.9 % IJ SOLN
INTRAVENOUS | Status: DC | PRN
  Administered 2021-02-05: 15 mL via INTRAMUSCULAR

## 2021-02-05 MED ORDER — SODIUM CHLORIDE 0.9 % IV SOLN: INTRAVENOUS | Status: DC

## 2021-02-05 MED ORDER — DEXAMETHASONE SODIUM PHOSPHATE 10 MG/ML IJ SOLN
INTRAMUSCULAR | Status: AC
  Filled 2021-02-05: qty 1

## 2021-02-05 MED ORDER — ROSUVASTATIN CALCIUM 20 MG PO TABS
20.0000 mg | ORAL_TABLET | Freq: Every day | ORAL | Status: DC
  Administered 2021-02-06 – 2021-02-07 (×2): 20 mg via ORAL
  Filled 2021-02-05 (×2): qty 1

## 2021-02-05 MED ORDER — ACETAMINOPHEN 650 MG RE SUPP
325.0000 mg | RECTAL | Status: DC | PRN
Start: 2021-02-05 — End: 2021-02-07

## 2021-02-05 MED ORDER — HYDROMORPHONE 1 MG/ML IV SOLN
INTRAVENOUS | Status: AC
  Filled 2021-02-05: qty 30

## 2021-02-05 MED ORDER — SODIUM CHLORIDE (PF) 0.9 % IJ SOLN
INTRAMUSCULAR | Status: AC
  Filled 2021-02-05: qty 10

## 2021-02-05 MED ORDER — LIDOCAINE 2% (20 MG/ML) 5 ML SYRINGE
INTRAMUSCULAR | Status: AC
  Filled 2021-02-05: qty 5

## 2021-02-05 MED ORDER — SUFENTANIL CITRATE 50 MCG/ML IV SOLN
INTRAVENOUS | Status: DC | PRN
  Administered 2021-02-05: 5 ug via INTRAVENOUS
  Administered 2021-02-05: 20 ug via INTRAVENOUS
  Administered 2021-02-05: 5 ug via INTRAVENOUS
  Administered 2021-02-05: 30 ug via INTRAVENOUS
  Administered 2021-02-05 (×2): 5 ug via INTRAVENOUS

## 2021-02-05 MED ORDER — CEFAZOLIN SODIUM-DEXTROSE 2-4 GM/100ML-% IV SOLN
INTRAVENOUS | Status: AC
  Filled 2021-02-05: qty 100

## 2021-02-05 MED ORDER — LIDOCAINE HCL (CARDIAC) PF 100 MG/5ML IV SOSY
PREFILLED_SYRINGE | INTRAVENOUS | Status: DC | PRN
  Administered 2021-02-05: 100 mg via INTRAVENOUS

## 2021-02-05 MED ORDER — ROCURONIUM 10MG/ML (10ML) SYRINGE FOR MEDFUSION PUMP - OPTIME
INTRAVENOUS | Status: DC | PRN
  Administered 2021-02-05 (×2): 50 mg via INTRAVENOUS
  Administered 2021-02-05: 100 mg via INTRAVENOUS

## 2021-02-05 MED ORDER — CEFAZOLIN SODIUM-DEXTROSE 2-4 GM/100ML-% IV SOLN
2.0000 g | Freq: Three times a day (TID) | INTRAVENOUS | Status: AC
  Administered 2021-02-05 – 2021-02-06 (×2): 2 g via INTRAVENOUS
  Filled 2021-02-05 (×2): qty 100

## 2021-02-05 MED ORDER — PHENOL 1.4 % MT LIQD
1.0000 | OROMUCOSAL | Status: DC | PRN
Start: 2021-02-05 — End: 2021-02-07

## 2021-02-05 MED ORDER — OXYCODONE HCL 5 MG PO TABS: 5.0000 mg | ORAL_TABLET | Freq: Once | ORAL | Status: DC | PRN

## 2021-02-05 MED ORDER — SUGAMMADEX SODIUM 200 MG/2ML IV SOLN
INTRAVENOUS | Status: DC | PRN
  Administered 2021-02-05: 200 mg via INTRAVENOUS

## 2021-02-05 MED ORDER — PROPOFOL 10 MG/ML IV BOLUS
INTRAVENOUS | Status: AC
  Filled 2021-02-05: qty 20

## 2021-02-05 MED ORDER — PROMETHAZINE HCL 25 MG/ML IJ SOLN: 6.2500 mg | INTRAMUSCULAR | Status: DC | PRN

## 2021-02-05 MED ORDER — DIPHENHYDRAMINE HCL 50 MG/ML IJ SOLN: 12.5000 mg | Freq: Four times a day (QID) | INTRAMUSCULAR | Status: DC | PRN

## 2021-02-05 MED ORDER — POTASSIUM CHLORIDE CRYS ER 20 MEQ PO TBCR: 20.0000 meq | EXTENDED_RELEASE_TABLET | Freq: Every day | ORAL | Status: DC | PRN

## 2021-02-05 MED ORDER — DOCUSATE SODIUM 100 MG PO CAPS
100.0000 mg | ORAL_CAPSULE | Freq: Every day | ORAL | Status: DC
  Administered 2021-02-06: 100 mg via ORAL
  Filled 2021-02-05 (×2): qty 1

## 2021-02-05 MED ORDER — ALUM & MAG HYDROXIDE-SIMETH 200-200-20 MG/5ML PO SUSP: 15.0000 mL | ORAL | Status: DC | PRN

## 2021-02-05 MED ORDER — ACETAMINOPHEN 10 MG/ML IV SOLN: 1000.0000 mg | Freq: Once | INTRAVENOUS | Status: DC | PRN

## 2021-02-05 MED ORDER — ONDANSETRON HCL 4 MG/2ML IJ SOLN: 4.0000 mg | Freq: Four times a day (QID) | INTRAMUSCULAR | Status: DC | PRN

## 2021-02-05 MED ORDER — SODIUM CHLORIDE 0.9 % IV SOLN
500.0000 mL | Freq: Once | INTRAVENOUS | Status: AC | PRN
  Administered 2021-02-05: 500 mL via INTRAVENOUS

## 2021-02-05 MED ORDER — POLYETHYLENE GLYCOL 3350 17 G PO PACK: 17.0000 g | PACK | Freq: Every day | ORAL | Status: DC | PRN

## 2021-02-05 MED ORDER — SODIUM CHLORIDE 0.9 % IV SOLN
INTRAVENOUS | Status: AC
  Filled 2021-02-05: qty 1.2

## 2021-02-05 MED ORDER — ONDANSETRON HCL 4 MG/2ML IJ SOLN
INTRAMUSCULAR | Status: AC
  Filled 2021-02-05: qty 2

## 2021-02-05 MED ORDER — MIDAZOLAM HCL 2 MG/2ML IJ SOLN
INTRAMUSCULAR | Status: AC
  Filled 2021-02-05: qty 2

## 2021-02-05 MED ORDER — ROCURONIUM BROMIDE 10 MG/ML (PF) SYRINGE
PREFILLED_SYRINGE | INTRAVENOUS | Status: AC
  Filled 2021-02-05: qty 20

## 2021-02-05 MED ORDER — PROTAMINE SULFATE 10 MG/ML IV SOLN
INTRAVENOUS | Status: AC
  Filled 2021-02-05: qty 5

## 2021-02-05 MED ORDER — PROTAMINE SULFATE 10 MG/ML IV SOLN
INTRAVENOUS | Status: DC | PRN
  Administered 2021-02-05: 40 mg via INTRAVENOUS

## 2021-02-05 MED ORDER — ACETAMINOPHEN 10 MG/ML IV SOLN
INTRAVENOUS | Status: DC | PRN
  Administered 2021-02-05: 1000 mg via INTRAVENOUS

## 2021-02-05 MED ORDER — FENTANYL CITRATE (PF) 100 MCG/2ML IJ SOLN: 25.0000 ug | INTRAMUSCULAR | Status: DC | PRN

## 2021-02-05 MED ORDER — CHLORHEXIDINE GLUCONATE 0.12 % MT SOLN
OROMUCOSAL | Status: AC
  Administered 2021-02-05: 15 mL
  Filled 2021-02-05: qty 15

## 2021-02-05 MED ORDER — CHLORHEXIDINE GLUCONATE CLOTH 2 % EX PADS
6.0000 | MEDICATED_PAD | Freq: Every day | CUTANEOUS | Status: DC
  Administered 2021-02-05: 6 via TOPICAL

## 2021-02-05 MED ORDER — GUAIFENESIN-DM 100-10 MG/5ML PO SYRP: 15.0000 mL | ORAL_SOLUTION | ORAL | Status: DC | PRN

## 2021-02-05 MED ORDER — HYDROMORPHONE 1 MG/ML IV SOLN
INTRAVENOUS | Status: DC
  Administered 2021-02-05: 0.3 mg via INTRAVENOUS
  Administered 2021-02-05: 0 mg via INTRAVENOUS
  Administered 2021-02-05: 1.2 mg via INTRAVENOUS
  Administered 2021-02-06: 3.3 mg via INTRAVENOUS
  Administered 2021-02-06: 0.3 mg via INTRAVENOUS

## 2021-02-05 MED ORDER — LACTATED RINGERS IV SOLN: INTRAVENOUS | Status: DC | PRN

## 2021-02-05 MED ORDER — PHENYLEPHRINE 40 MCG/ML (10ML) SYRINGE FOR IV PUSH (FOR BLOOD PRESSURE SUPPORT)
PREFILLED_SYRINGE | INTRAVENOUS | Status: AC
  Filled 2021-02-05: qty 10

## 2021-02-05 MED ORDER — CLOTRIMAZOLE 1 % EX CREA
TOPICAL_CREAM | Freq: Two times a day (BID) | CUTANEOUS | Status: DC
  Administered 2021-02-05 – 2021-02-06 (×2): 1 via TOPICAL
  Filled 2021-02-05: qty 15

## 2021-02-05 MED ORDER — HEPARIN SODIUM (PORCINE) 1000 UNIT/ML IJ SOLN
INTRAMUSCULAR | Status: DC | PRN
  Administered 2021-02-05: 7000 [IU] via INTRAVENOUS
  Administered 2021-02-05: 1000 [IU] via INTRAVENOUS

## 2021-02-05 MED ORDER — PAPAVERINE HCL 30 MG/ML IJ SOLN
INTRAMUSCULAR | Status: AC
  Filled 2021-02-05: qty 2

## 2021-02-05 MED ORDER — PROPOFOL 10 MG/ML IV BOLUS
INTRAVENOUS | Status: DC | PRN
  Administered 2021-02-05: 100 mg via INTRAVENOUS

## 2021-02-05 MED ORDER — PHENYLEPHRINE HCL-NACL 10-0.9 MG/250ML-% IV SOLN
INTRAVENOUS | Status: DC | PRN
  Administered 2021-02-05: 50 ug/min via INTRAVENOUS

## 2021-02-05 MED ORDER — MIDAZOLAM HCL 2 MG/2ML IJ SOLN
INTRAMUSCULAR | Status: DC | PRN
  Administered 2021-02-05: 2 mg via INTRAVENOUS

## 2021-02-05 MED ORDER — MAGNESIUM SULFATE 2 GM/50ML IV SOLN
2.0000 g | Freq: Every day | INTRAVENOUS | Status: DC | PRN
Start: 2021-02-05 — End: 2021-02-07

## 2021-02-05 MED ORDER — PAPAVERINE HCL 30 MG/ML IJ SOLN
INTRAMUSCULAR | Status: DC | PRN
  Administered 2021-02-05: 2 mL

## 2021-02-05 MED ORDER — 0.9 % SODIUM CHLORIDE (POUR BTL) OPTIME
TOPICAL | Status: DC | PRN
  Administered 2021-02-05: 2000 mL

## 2021-02-05 MED ORDER — DIPHENHYDRAMINE HCL 12.5 MG/5ML PO ELIX
12.5000 mg | ORAL_SOLUTION | Freq: Four times a day (QID) | ORAL | Status: DC | PRN
  Filled 2021-02-05: qty 5

## 2021-02-05 MED ORDER — PHENYLEPHRINE HCL (PRESSORS) 10 MG/ML IV SOLN
INTRAVENOUS | Status: DC | PRN
  Administered 2021-02-05 (×2): 120 ug via INTRAVENOUS

## 2021-02-05 MED ORDER — HYDRALAZINE HCL 20 MG/ML IJ SOLN: 5.0000 mg | INTRAMUSCULAR | Status: DC | PRN

## 2021-02-05 MED ORDER — ASPIRIN EC 81 MG PO TBEC
81.0000 mg | DELAYED_RELEASE_TABLET | Freq: Every day | ORAL | Status: DC
  Administered 2021-02-06 – 2021-02-07 (×2): 81 mg via ORAL
  Filled 2021-02-05 (×2): qty 1

## 2021-02-05 SURGICAL SUPPLY — 58 items
BANDAGE ESMARK 6X9 LF (GAUZE/BANDAGES/DRESSINGS) ×1 IMPLANT
BNDG ESMARK 6X9 LF (GAUZE/BANDAGES/DRESSINGS) ×2
CANISTER SUCT 3000ML PPV (MISCELLANEOUS) ×2 IMPLANT
CANNULA VESSEL 3MM 2 BLNT TIP (CANNULA) ×4 IMPLANT
CATH EMB 4FR 80CM (CATHETERS) ×2 IMPLANT
CLIP VESOCCLUDE MED 24/CT (CLIP) ×2 IMPLANT
CLIP VESOCCLUDE SM WIDE 24/CT (CLIP) ×4 IMPLANT
CUFF TOURN SGL QUICK 18X4 (TOURNIQUET CUFF) IMPLANT
CUFF TOURN SGL QUICK 24 (TOURNIQUET CUFF) ×1
CUFF TOURN SGL QUICK 34 (TOURNIQUET CUFF)
CUFF TOURN SGL QUICK 42 (TOURNIQUET CUFF) IMPLANT
CUFF TRNQT CYL 24X4X16.5-23 (TOURNIQUET CUFF) ×1 IMPLANT
CUFF TRNQT CYL 34X4.125X (TOURNIQUET CUFF) IMPLANT
DERMABOND ADVANCED (GAUZE/BANDAGES/DRESSINGS) ×4
DERMABOND ADVANCED .7 DNX12 (GAUZE/BANDAGES/DRESSINGS) ×4 IMPLANT
DRAIN CHANNEL 15F RND FF W/TCR (WOUND CARE) IMPLANT
DRAPE HALF SHEET 40X57 (DRAPES) ×2 IMPLANT
DRAPE X-RAY CASS 24X20 (DRAPES) IMPLANT
ELECT REM PT RETURN 9FT ADLT (ELECTROSURGICAL) ×2
ELECTRODE REM PT RTRN 9FT ADLT (ELECTROSURGICAL) ×1 IMPLANT
EVACUATOR SILICONE 100CC (DRAIN) IMPLANT
GLOVE BIO SURGEON STRL SZ7.5 (GLOVE) ×2 IMPLANT
GLOVE SRG 8 PF TXTR STRL LF DI (GLOVE) ×1 IMPLANT
GLOVE SURG MICRO LTX SZ6.5 (GLOVE) ×6 IMPLANT
GLOVE SURG UNDER POLY LF SZ8 (GLOVE) ×1
GOWN STRL REUS W/ TWL LRG LVL3 (GOWN DISPOSABLE) ×3 IMPLANT
GOWN STRL REUS W/TWL LRG LVL3 (GOWN DISPOSABLE) ×3
KIT BASIN OR (CUSTOM PROCEDURE TRAY) ×2 IMPLANT
KIT TURNOVER KIT B (KITS) ×2 IMPLANT
MARKER GRAFT CORONARY BYPASS (MISCELLANEOUS) IMPLANT
NS IRRIG 1000ML POUR BTL (IV SOLUTION) ×4 IMPLANT
PACK PERIPHERAL VASCULAR (CUSTOM PROCEDURE TRAY) ×2 IMPLANT
PAD ARMBOARD 7.5X6 YLW CONV (MISCELLANEOUS) ×4 IMPLANT
PAD CAST 3X4 CTTN HI CHSV (CAST SUPPLIES) ×1 IMPLANT
PADDING CAST COTTON 3X4 STRL (CAST SUPPLIES) ×1
SET COLLECT BLD 21X3/4 12 (NEEDLE) ×2 IMPLANT
SPONGE LAP 18X18 RF (DISPOSABLE) ×2 IMPLANT
SPONGE SURGIFOAM ABS GEL 100 (HEMOSTASIS) IMPLANT
STOPCOCK 4 WAY LG BORE MALE ST (IV SETS) ×2 IMPLANT
SUT MNCRL AB 4-0 PS2 18 (SUTURE) ×6 IMPLANT
SUT PROLENE 5 0 C 1 24 (SUTURE) ×6 IMPLANT
SUT PROLENE 6 0 BV (SUTURE) ×22 IMPLANT
SUT PROLENE 7 0 BV 1 (SUTURE) IMPLANT
SUT SILK 2 0 SH (SUTURE) ×2 IMPLANT
SUT SILK 3 0 (SUTURE) ×3
SUT SILK 3-0 18XBRD TIE 12 (SUTURE) ×3 IMPLANT
SUT VIC AB 2-0 CTB1 (SUTURE) ×4 IMPLANT
SUT VIC AB 3-0 SH 27 (SUTURE) ×4
SUT VIC AB 3-0 SH 27X BRD (SUTURE) ×4 IMPLANT
SUT VICRYL 4-0 PS2 18IN ABS (SUTURE) ×4 IMPLANT
SYR 10ML LL (SYRINGE) ×2 IMPLANT
SYR 20ML LL LF (SYRINGE) ×4 IMPLANT
SYR 3ML LL SCALE MARK (SYRINGE) ×2 IMPLANT
TOWEL GREEN STERILE (TOWEL DISPOSABLE) ×2 IMPLANT
TRAY FOLEY MTR SLVR 16FR STAT (SET/KITS/TRAYS/PACK) ×2 IMPLANT
TUBING EXTENTION W/L.L. (IV SETS) ×2 IMPLANT
UNDERPAD 30X36 HEAVY ABSORB (UNDERPADS AND DIAPERS) ×2 IMPLANT
WATER STERILE IRR 1000ML POUR (IV SOLUTION) ×2 IMPLANT

## 2021-02-05 NOTE — Progress Notes (Signed)
Pt arrived from unit from PACU.CHG bath given , Pt arrived with foley, PCA pump,Pt on NC2L education giiven to pt. Left leg incisons clean and dry intact with skin glue , Left Art line intact. CCMD called.Will continue to monitor. Pt Oriented to unit   Everlean Cherry, RN

## 2021-02-05 NOTE — Interval H&P Note (Signed)
History and Physical Interval Note:  02/05/2021 7:09 AM  Evan Rodriguez  has presented today for surgery, with the diagnosis of SUPERFICIAL FEMORAL ARTERY OCCLUSION.  The various methods of treatment have been discussed with the patient and family. After consideration of risks, benefits and other options for treatment, the patient has consented to  Procedure(s): LEFT SUPERFICIAL FEMORAL-PERONEAL ARTERY BYPASS GRAFT WITH VEIN (Left) as a surgical intervention.  The patient's history has been reviewed, patient examined, no change in status, stable for surgery.  I have reviewed the patient's chart and labs.  Questions were answered to the patient's satisfaction.     [name]

## 2021-02-05 NOTE — Anesthesia Procedure Notes (Signed)
Arterial Line Insertion Start/End4/03/2021 8:04 AM, 02/05/2021 8:06 AM Performed by: Atilano Median, DO, anesthesiologist  Patient location: OR. Preanesthetic checklist: patient identified, IV checked, site marked, risks and benefits discussed, surgical consent, monitors and equipment checked, pre-op evaluation, timeout performed and anesthesia consent Lidocaine 1% used for infiltration Left, radial was placed Catheter size: 20 Fr Hand hygiene performed  and maximum sterile barriers used   Attempts: 1 Procedure performed using ultrasound guided technique. Ultrasound Notes:anatomy identified, needle tip was noted to be adjacent to the nerve/plexus identified and no ultrasound evidence of intravascular and/or intraneural injection Following insertion, dressing applied and Biopatch. Post procedure assessment: normal and unchanged  Patient tolerated the procedure well with no immediate complications.

## 2021-02-05 NOTE — Op Note (Signed)
NAME: Evan Rodriguez    MRN: [id number] DOB: [birth year]    DATE OF OPERATION: 02/05/2021  PREOP DIAGNOSIS:    Critical limb ischemia left lower extremity  POSTOP DIAGNOSIS:    Same  PROCEDURE:    1.  Left superficial femoral artery to peroneal artery bypass (nonreversed translocated saphenous vein graft) 2.  Intraoperative arteriogram  SURGEON: Di Kindle. Edilia Bo, MD  ASSIST: Darlin Coco, PA  ANESTHESIA: General  EBL: 200 cc  INDICATIONS:    Evan Rodriguez is a 45 y.o. male who presented with nonhealing wounds of the left foot and evidence of severe infrainguinal arterial occlusive disease.  He had occlusion of the superficial femoral artery and reconstitution of a small peroneal artery only.  It was felt that femme peroneal bypass was his only chance for limb salvage.  FINDINGS:   The peroneal artery was very small.  The largest dilator that it would take was 2 mm.  TECHNIQUE:   The patient was taken to the operating room and received a general anesthetic.  The left lower extremity was prepped and draped in usual sterile fashion.  A longitudinal incision was made along the medial aspect of the left thigh starting just below the groin.  Using a skin bridge an additional incision was made along the medial aspect of the thigh.  The great saphenous vein was harvested from the saphenofemoral junction down to the knee.  Branches were divided between clips and 3-0 silk ties.  I then made an additional incision below the knee and the vein was harvested to the mid calf using one additional incision along the medial leg.  A separate incision was made in the distal thigh to expose the above-knee popliteal artery and distal superficial femoral artery.  The artery was exposed and then this dissection carried up to Hunter's canal.  The saphenous nerve was preserved.  Hunter's canal was opened allowing exposure of the distal superficial femoral artery.  There appeared to be a pulse at  this level.  Below that the artery was chronically occluded.  Next through the distal incision in the leg the dissection was carried down through the soleus muscle and the posterior tibial neurovascular bundle was identified and retracted posteriorly allowing exposure of the peroneal vein deep in the wound.  This was a large vein overlying the artery.  Beneath the vein the artery was quite small.  I dissected proximally and distally to see if there was any larger segment but this was as good as it was.  A tunnel was then created between the 2 incisions subfascially.  The patient was heparinized.  The vein was removed from the saphenofemoral junction to the mid calf and gently distended with heparinized saline.  Tourniquet is placed on the thigh and the leg exsanguinated with an Esmarch bandage.  The tourniquet was inflated to 300 mmHg.  Under tourniquet control I opened the distal superficial femoral artery however this had organized thrombus and chronic occlusion here.  I did not think I could get above this for the proximal anastomosis.  Therefore I connected the 2 incisions in the thigh and allowed exposure of the superficial femoral artery in the mid thigh.  Here there was an excellent pulse.  The artery was softer here.  Fortunately had enough vein to reach to this level.  The superficial femoral artery in the mid thigh was clamped proximally distally and a longitudinal arteriotomy was made.  The vein was then spatulated to used in a nonreversed fashion  and sewn end-to-side to the superficial femoral artery using continuous 6-0 Prolene suture.  Prior to completing anastomosis the artery was backbled and flushed appropriately and the anastomosis completed.  Of note the arteriotomy below the knee was closed with a running 5-0 Prolene suture.  I then used a retrograde [place] valvulotome to sharply excise the valves and the vein graft.  The valvulotome was passed twice and excellent flow established through the  vein which was flushed with heparinized saline clipped distally.  It was marked to prevent twisting.  It was then brought through the subfascial tunnel for anastomosis to the peroneal artery.  The tourniquet was placed on the thigh the leg examined with Esmarch bandage and the tourniquet inflated to 250 mmHg.  Under tourniquet control I opened this very small peroneal artery longitudinally.  The largest dilator it would take was 2 mm.  I selected the largest segment of the artery.  The vein was spatulated cut to the appropriate length and sewn end-to-side to the peroneal artery using 2 continuous 6-0 Prolene sutures.  Prior to completing anastomosis the tourniquet was released.  The artery was backbled and flushed appropriately and the anastomosis completed.  At this point there was a good signal distal to the anastomosis with good diastolic flow and a good peroneal signal with a Doppler.  Hemostasis was obtained in the wounds.  The heparin was partially reversed with protamine.  Each of the wounds was closed with a deep layer of 2-0 Vicryl, the subcutaneous layer with 3-0 Vicryl, and the skin closed with 4-0 Vicryl.  Dermabond was applied.  The patient tolerated the procedure well was transferred to the recovery room in stable condition.  All needle and sponge counts were correct.  Given the complexity of the case a first assistant was necessary in order to expedient the procedure and safely perform the technical aspects of the operation.  [name], MD, FACS [facility]  DATE OF DICTATION:   02/05/2021

## 2021-02-05 NOTE — Anesthesia Procedure Notes (Signed)
Procedure Name: Intubation Performed by: Claris Che, CRNA Pre-anesthesia Checklist: Patient identified, Emergency Drugs available, Suction available, Patient being monitored and Timeout performed Patient Re-evaluated:Patient Re-evaluated prior to induction Oxygen Delivery Method: Circle system utilized Preoxygenation: Pre-oxygenation with 100% oxygen Induction Type: IV induction and Cricoid Pressure applied Ventilation: Mask ventilation without difficulty Laryngoscope Size: Mac and 4 Grade View: Grade II Tube type: Oral Tube size: 8.0 mm Number of attempts: 1 Airway Equipment and Method: Stylet Placement Confirmation: ETT inserted through vocal cords under direct vision,  positive ETCO2 and breath sounds checked- equal and bilateral Secured at: 23 cm Tube secured with: Tape Dental Injury: Teeth and Oropharynx as per pre-operative assessment

## 2021-02-05 NOTE — Anesthesia Procedure Notes (Deleted)
Arterial Line Insertion Start/End4/03/2021 8:00 AM, 02/05/2021 8:40 AM  Patient location: OR. Preanesthetic checklist: patient identified and monitors and equipment checked radial was placed Catheter size: 20 G Maximum sterile barriers used   Attempts: 3 Procedure performed using ultrasound guided technique. Ultrasound Notes:anatomy identified, needle tip was noted to be adjacent to the nerve/plexus identified and no ultrasound evidence of intravascular and/or intraneural injection Following insertion, Biopatch. Post procedure assessment: normal

## 2021-02-05 NOTE — Progress Notes (Signed)
   VASCULAR SURGERY POSTOP:   Doing well postop. The left foot is hyperemic with a good peroneal, dorsalis pedis, and posterior tibial signal. Awaiting bed on 4 E.   SUBJECTIVE:   Comfortable  PHYSICAL EXAM:   Vitals:   02/05/21 1350 02/05/21 1400 02/05/21 1415 02/05/21 1430  BP: 96/70   96/69  Pulse: (!) 102 100 (!) 102 99  Resp: 12 18 16 16   Temp:      TempSrc:      SpO2: 95% 96% 96% 96%  Weight:      Height:       Brisk peroneal signal with the Doppler.  Also has a posterior tibial and dorsalis pedis signal. His incisions look fine.  LABS:    PROBLEM LIST:    Active Problems:   PAD (peripheral artery disease) (HCC)   CURRENT MEDS:   . Chlorhexidine Gluconate Cloth  6 each Topical Once   And  . Chlorhexidine Gluconate Cloth  6 each Topical Once  . HYDROmorphone   Intravenous Q4H  . HYDROmorphone        Office: [phone] 02/05/2021

## 2021-02-05 NOTE — Transfer of Care (Signed)
Immediate Anesthesia Transfer of Care Note  Patient: Evan Rodriguez  Procedure(s) Performed: LEFT SUPERFICIAL FEMORAL-PERONEAL ARTERY BYPASS GRAFT WITH SUBFACIAL, NON-REVERSED  VEIN. (Left Leg Upper) INTRA OPERATIVE ARTERIOGRAM- LEFT LOWER EXTREMITY (Left Leg Upper)  Patient Location: PACU  Anesthesia Type:General  Level of Consciousness: drowsy, patient cooperative and responds to stimulation  Airway & Oxygen Therapy: Patient Spontanous Breathing and Patient connected to nasal cannula oxygen  Post-op Assessment: Report given to RN, Post -op Vital signs reviewed and stable and Patient moving all extremities X 4  Post vital signs: Reviewed and stable  Last Vitals:  Vitals Value Taken Time  BP 104/71 02/05/21 1335  Temp    Pulse 103 02/05/21 1341  Resp 9 02/05/21 1341  SpO2 95 % 02/05/21 1341  Vitals shown include unvalidated device data.  Last Pain:  Vitals:   02/05/21 0600  TempSrc: Oral  PainSc: 10-Worst pain ever         Complications: No complications documented.

## 2021-02-05 NOTE — Discharge Instructions (Signed)
 [facility] [place]  Discharge instructions  Lower Extremity Bypass Surgery  Please refer to the following instruction for your post-procedure care. Your surgeon or physician assistant will discuss any changes with you.  Activity  You are encouraged to walk as much as you can. You can slowly return to normal activities during the month after your surgery. Avoid strenuous activity and heavy lifting until your doctor tells you it's OK. Avoid activities such as vacuuming or swinging a golf club. Do not drive until your doctor give the OK and you are no longer taking prescription pain medications. It is also normal to have difficulty with sleep habits, eating and bowel movement after surgery. These will go away with time.  Bathing/Showering  Shower daily after you go home. Do not soak in a bathtub, hot tub, or swim until the incision heals completely.  Incision Care  Clean your incision with mild soap and water. Shower every day. Pat the area dry with a clean towel. You do not need a bandage unless otherwise instructed. Do not apply any ointments or creams to your incision. If you have open wounds you will be instructed how to care for them or a visiting nurse may be arranged for you. If you have staples or sutures along your incision they will be removed at your post-op appointment. You may have skin glue on your incision. Do not peel it off. It will come off on its own in about one week.  Wash the groin wound with soap and water daily and pat dry. (No tub bath-only shower)  Then put a dry gauze or washcloth in the groin to keep this area dry to help prevent wound infection.  Do this daily and as needed.  Do not use Vaseline or neosporin on your incisions.  Only use soap and water on your incisions and then protect and keep dry.  Diet  Resume your normal diet. There are no special food restrictions following this procedure. A low fat/ low cholesterol diet is  recommended for all patients with vascular disease. In order to heal from your surgery, it is CRITICAL to get adequate nutrition. Your body requires vitamins, minerals, and protein. Vegetables are the best source of vitamins and minerals. Vegetables also provide the perfect balance of protein. Processed food has little nutritional value, so try to avoid this.  Medications  Resume taking all your medications unless your doctor or physician assistant tells you not to. If your incision is causing pain, you may take over-the-counter pain relievers such as acetaminophen (Tylenol). If you were prescribed a stronger pain medication, please aware these medication can cause nausea and constipation. Prevent nausea by taking the medication with a snack or meal. Avoid constipation by drinking plenty of fluids and eating foods with high amount of fiber, such as fruits, vegetables, and grains. Take Colace 100 mg (an over-the-counter stool softener) twice a day as needed for constipation.  Do not take Tylenol if you are taking prescription pain medications.  Follow Up  Our office will schedule a follow up appointment 2-3 weeks following discharge.  Please call us immediately for any of the following conditions  Severe or worsening pain in your legs or feet while at rest or while walking Increase pain, redness, warmth, or drainage (pus) from your incision site(s) Fever of 101 degree or higher The swelling in your leg with the bypass suddenly worsens and becomes more painful than when you were in the hospital If you have   been instructed to feel your graft pulse then you should do so every day. If you can no longer feel this pulse, call the office immediately. Not all patients are given this instruction.  Leg swelling is common after leg bypass surgery.  The swelling should improve over a few months following surgery. To improve the swelling, you may elevate your legs above the level of your heart while you are  sitting or resting. Your surgeon or physician assistant may ask you to apply an ACE wrap or wear compression (TED) stockings to help to reduce swelling.  Reduce your risk of vascular disease  Stop smoking. If you would like help call [facility] at 1-800-QUIT-NOW (1-800-784-8669) or [place] at 336-586-4000.  Manage your cholesterol Maintain a desired weight Control your diabetes weight Control your diabetes Keep your blood pressure down  If you have any questions, please call the office at [phone]  

## 2021-02-06 ENCOUNTER — Encounter (HOSPITAL_COMMUNITY): Payer: Self-pay | Admitting: Vascular Surgery

## 2021-02-06 LAB — CBC
HCT: 34.8 % — ABNORMAL LOW (ref 39.0–52.0)
Hemoglobin: 11.4 g/dL — ABNORMAL LOW (ref 13.0–17.0)
MCH: 32.1 pg (ref 26.0–34.0)
MCHC: 32.8 g/dL (ref 30.0–36.0)
MCV: 98 fL (ref 80.0–100.0)
Platelets: 247 K/uL (ref 150–400)
RBC: 3.55 MIL/uL — ABNORMAL LOW (ref 4.22–5.81)
RDW: 15.5 % (ref 11.5–15.5)
WBC: 9.9 K/uL (ref 4.0–10.5)
nRBC: 0 % (ref 0.0–0.2)

## 2021-02-06 LAB — LIPID PANEL
Cholesterol: 108 mg/dL (ref 0–200)
HDL: 32 mg/dL — ABNORMAL LOW (ref >40)
LDL Cholesterol: 62 mg/dL (ref 0–99)
Total CHOL/HDL Ratio: 3.4 RATIO
Triglycerides: 69 mg/dL (ref <150)
VLDL: 14 mg/dL (ref 0–40)

## 2021-02-06 LAB — APTT: aPTT: 28 seconds (ref 24–36)

## 2021-02-06 LAB — BASIC METABOLIC PANEL
Anion gap: 5 (ref 5–15)
BUN: 9 mg/dL (ref 6–20)
CO2: 22 mmol/L (ref 22–32)
Calcium: 7.7 mg/dL — ABNORMAL LOW (ref 8.9–10.3)
Chloride: 108 mmol/L (ref 98–111)
Creatinine, Ser: 0.75 mg/dL (ref 0.61–1.24)
GFR, Estimated: >60 mL/min (ref >60)
Glucose, Bld: 143 mg/dL — ABNORMAL HIGH (ref 70–99)
Potassium: 4.4 mmol/L (ref 3.5–5.1)
Sodium: 135 mmol/L (ref 135–145)

## 2021-02-06 MED ORDER — OXYCODONE HCL 5 MG PO TABS
5.0000 mg | ORAL_TABLET | ORAL | Status: DC | PRN
  Administered 2021-02-06: 5 mg via ORAL
  Administered 2021-02-06: 10 mg via ORAL
  Administered 2021-02-06: 5 mg via ORAL
  Administered 2021-02-06 – 2021-02-07 (×2): 10 mg via ORAL
  Filled 2021-02-06 (×3): qty 2
  Filled 2021-02-06 (×2): qty 1

## 2021-02-06 MED ORDER — HEPARIN SODIUM (PORCINE) 5000 UNIT/ML IJ SOLN
5000.0000 [IU] | Freq: Three times a day (TID) | INTRAMUSCULAR | Status: DC
  Administered 2021-02-06 – 2021-02-07 (×3): 5000 [IU] via SUBCUTANEOUS
  Filled 2021-02-06 (×3): qty 1

## 2021-02-06 MED ORDER — HYDROMORPHONE HCL 1 MG/ML IJ SOLN
1.0000 mg | INTRAMUSCULAR | Status: DC | PRN
Start: 2021-02-06 — End: 2021-02-07
  Administered 2021-02-06 (×2): 1 mg via INTRAVENOUS
  Filled 2021-02-06 (×2): qty 1

## 2021-02-06 NOTE — Progress Notes (Addendum)
[facility] [place]  VASCULAR SURGERY ASSESSMENT & PLAN:   POD 1 LEFT SFA TO PERONEAL BYPASS (VEIN): His bypass graft is patent with brisk Doppler signals in the left foot. PER > DP/PT. he has peroneal runoff only.  VASCULAR QUALITY INITIATIVE: He is on aspirin and is on a statin.  DVT PROPHYLAXIS: I have switched his IV heparin (400 units an hour) to subcu heparin per pharmacy.  DISPOSITION: Home once he is ambulating and his pain is controlled on po meds.  Elevate legs when in bed.  [name], MD Office: [phone]   Subjective  - Wants to get rid of PCA it kept him up all night.   Objective 93/63 92 97.9 F (36.6 C) (Axillary) 15 94%  Intake/Output Summary (Last 24 hours) at 02/06/2021 0749 Last data filed at 02/06/2021 0550 Gross per 24 hour  Intake 5098.37 ml  Output 2740 ml  Net 2358.37 ml    Left groin soft without hematoma Lower leg incision healing well Pain in left GT and 4th toe Doppler signals DP/PT/peroneal intact Lungs non labored breathing  Assessment/Planning: POD # 1  1.  Left superficial femoral artery to peroneal artery bypass (nonreversed translocated saphenous vein graft) 2.  Intraoperative arteriogram  Inflow good with intact doppler signals on the left foot D/C PCA  PT/OT eval and treat Afebrile with out leukocytosis HGB stable post op 11.4 Cr WNL, good urine OP 2,500 cc  Order in for Heparin per pharmacy  Mosetta Pigeon 02/06/2021 7:49 AM --  Laboratory Lab Results: Recent Labs    02/05/21 1629 02/06/21 0215  WBC 13.5* 9.9  HGB 12.7* 11.4*  HCT 38.4* 34.8*  PLT 263 247   BMET Recent Labs    02/04/21 1200 02/06/21 0215  NA 140 135  K 4.7 4.4  CL 106 108  CO2 26 22  GLUCOSE 97 143*  BUN 10 9  CREATININE 0.88 0.75  CALCIUM 9.1 7.7*    COAG Lab Results  Component Value Date   INR 1.0 02/04/2021   INR 1.09 02/22/2016   No results found for: PTT

## 2021-02-06 NOTE — Progress Notes (Signed)
PHARMACIST LIPID MONITORING   Evan Rodriguez is a 45 y.o. male admitted on 02/05/2021 with critical limb ischemia.  Pharmacy has been consulted to optimize lipid-lowering therapy with the indication of secondary prevention for clinical ASCVD.  Recent Labs:  Lipid Panel (last 6 months):   Lab Results  Component Value Date   CHOL 108 02/06/2021   TRIG 69 02/06/2021   HDL 32 (L) 02/06/2021   CHOLHDL 3.4 02/06/2021   VLDL 14 02/06/2021   LDLCALC 62 02/06/2021    Hepatic function panel (last 6 months):   Lab Results  Component Value Date   AST 22 02/04/2021   ALT 37 02/04/2021   ALKPHOS 69 02/04/2021   BILITOT 0.3 02/04/2021    SCr (since admission):   Serum creatinine: 0.75 mg/dL 86/38/17 7116 Estimated creatinine clearance: 104.1 mL/min  Current therapy and lipid therapy tolerance Current lipid-lowering therapy: rosuvastatin 20mg  recently started 01/24/21 Previous lipid-lowering therapies (if applicable): atorvastatin 80mg  (stopped taking d/t lost to cardiology follow up) Documented or reported allergies or intolerances to lipid-lowering therapies (if applicable): n/a  Assessment:   Patient agrees with changes to lipid-lowering therapy  Patient recently started on high dose lipid lowering therapy, will continue current with at goal LDL.   Plan:    1.Statin intensity (high intensity recommended for all patients regardless of the LDL):  No statin changes. The patient is already on a high intensity statin.  2.Add ezetimibe (if any one of the following):   Not indicated at this time.  3.Refer to lipid clinic:   No  4.Follow-up with:  Primary care provider- patient also to follow up with [facility] cardiology after hospitalization 5.Follow-up labs after discharge:  Lipid therapy was recently started. Check a lipid panel in 8-12 weeks then annually.     [facility] PharmD., BCPS Clinical Pharmacist 02/06/2021 8:26 AM

## 2021-02-06 NOTE — Progress Notes (Signed)
PCA pump D/C per order. Pump stopped and 17mg  wasted with RN in steri-cycle.   Marcelino Duster, RN

## 2021-02-06 NOTE — Evaluation (Signed)
Occupational Therapy Evaluation Patient Details Name: Evan Rodriguez MRN: [id number] DOB: June 19, 1976 Today's Date: 02/06/2021    History of Present Illness pt is a 45 y/o male admitted 4/5 with L LE critical ischemia with non-healing L foot wounds, pt s/p Left superficial femoral artery to peroneal artery bypass.  PMHx:  NSTEM with stenting,, COVID 19, CAD   Clinical Impression   Patient admitted for the diagnosis and procedure above.  PTA he worked full time and needed no assist for mobility, ADL, IADL.  Patient did state L leg pain with short distances.  Barriers are listed below.  He has an adult daughter that can assist with meals and home management.  Currently he is needing up to supervision and RW for mobility, and up to Min A for lower body ADL completion.  OT will follow in the acute setting to maximize functional status and to make final recommendation post acute.  It is not anticipated he will need any OT after the hospital.      Follow Up Recommendations  No OT follow up    Equipment Recommendations  None recommended by OT    Recommendations for Other Services       Precautions / Restrictions Precautions Precautions: Fall Restrictions Weight Bearing Restrictions: No      Mobility Bed Mobility Overal bed mobility: Modified Independent               Patient Response: Cooperative  Transfers Overall transfer level: Needs assistance Equipment used: Rolling walker (2 wheeled) Transfers: Sit to/from Stand Sit to Stand: Supervision              Balance Overall balance assessment: Needs assistance Sitting-balance support: No upper extremity supported Sitting balance-Leahy Scale: Good     Standing balance support: Bilateral upper extremity supported Standing balance-Leahy Scale: Fair Standing balance comment: Can static stand without UE support.                           ADL either performed or assessed with clinical judgement   ADL    Eating/Feeding: Independent;Sitting   Grooming: Set up;Standing;Sitting   Upper Body Bathing: Set up;Sitting   Lower Body Bathing: Minimal assistance;Sit to/from stand   Upper Body Dressing : Sitting;Independent   Lower Body Dressing: Sit to/from stand;Minimal assistance   Toilet Transfer: Supervision/safety;RW           Functional mobility during ADLs: Supervision/safety       Vision Patient Visual Report: No change from baseline       Perception     Praxis      Pertinent Vitals/Pain Faces Pain Scale: Hurts even more Pain Location: leg incisions Pain Descriptors / Indicators: Discomfort;Grimacing;Guarding;Numbness Pain Intervention(s): Monitored during session     Hand Dominance Left   Extremity/Trunk Assessment Upper Extremity Assessment Upper Extremity Assessment: Overall WFL for tasks assessed   Lower Extremity Assessment Lower Extremity Assessment: Defer to PT evaluation   Cervical / Trunk Assessment Cervical / Trunk Assessment: Normal   Communication Communication Communication: No difficulties   Cognition Arousal/Alertness: Awake/alert Behavior During Therapy: Flat affect;Restless Overall Cognitive Status: Within Functional Limits for tasks assessed                                     General Comments  patient de-sat'd with in room mobility on RA to 87% and DOE noted.  patient returned to  1.5 L via [place] and up to 95%.    Exercises     Shoulder Instructions      Home Living Family/patient expects to be discharged to:: Private residence Living Arrangements: Children Available Help at Discharge: Family;Available PRN/intermittently Type of Home: Mobile home Home Access: Ramped entrance     Home Layout: One level     Bathroom Shower/Tub: Chief Strategy Officer: Standard     Home Equipment: Walker - 2 wheels          Prior Functioning/Environment Level of Independence: Independent                  OT Problem List: Impaired balance (sitting and/or standing);Decreased knowledge of use of DME or AE;Pain      OT Treatment/Interventions: Self-care/ADL training;DME and/or AE instruction;Therapeutic activities;Patient/family education;Balance training    OT Goals(Current goals can be found in the care plan section) Acute Rehab OT Goals Patient Stated Goal: Get back to work when they let me. OT Goal Formulation: With patient Time For Goal Achievement: 02/20/21 Potential to Achieve Goals: Good ADL Goals Pt Will Perform Lower Body Bathing: Independently;sit to/from stand Pt Will Perform Lower Body Dressing: Independently;sit to/from stand Pt Will Transfer to Toilet: with modified independence;ambulating;regular height toilet  OT Frequency: Min 2X/week   Barriers to D/C:    none noted       Co-evaluation              AM-PAC OT "6 Clicks" Daily Activity     Outcome Measure Help from another person eating meals?: None Help from another person taking care of personal grooming?: None Help from another person toileting, which includes using toliet, bedpan, or urinal?: A Little Help from another person bathing (including washing, rinsing, drying)?: A Little Help from another person to put on and taking off regular upper body clothing?: None Help from another person to put on and taking off regular lower body clothing?: A Little 6 Click Score: 21   End of Session    Activity Tolerance: Patient tolerated treatment well Patient left: in bed;with call bell/phone within reach  OT Visit Diagnosis: Unsteadiness on feet (R26.81);Pain Pain - Right/Left: Left Pain - part of body: Leg                Time: 1139-1200 OT Time Calculation (min): 21 min Charges:  OT General Charges $OT Visit: 1 Visit OT Evaluation $OT Eval Moderate Complexity: 1 Mod  02/06/2021  Rich, OTR/L  Acute Rehabilitation Services  Office:  [phone]   Suzanna Obey 02/06/2021, 3:13 PM

## 2021-02-06 NOTE — Progress Notes (Signed)
Mobility Specialist: Progress Note   02/06/21 1536  Mobility  Activity Ambulated in hall  Level of Assistance Contact guard assist, steadying assist  Assistive Device Front wheel walker  Distance Ambulated (ft) 170 ft  Mobility Response Tolerated fair  Mobility performed by Mobility specialist  $Mobility charge 1 Mobility   Pre-Mobility: 88 HR, 103/78 BP, 95% SpO2 During Mobility: 118 HR Post-Mobility: 103 HR, 117/79 BP, 95% SpO2  Pt ambulated on 1 L/min [place]. Pt c/o 10/10 pain in R calf and was audibly SOB during ambulation. Pt coached through pursed lip breathing, recovered quickly. Pt back to bed after walk with RLE elevated.   [facility] Melah Ebling Mobility Specialist Mobility Specialist Phone: [phone]

## 2021-02-06 NOTE — Evaluation (Addendum)
Physical Therapy Evaluation Patient Details Name: Evan Rodriguez MRN: [id number] DOB: Dec 25, 1975 Today's Date: 02/06/2021   History of Present Illness  pt is a 45 y/o male admitted 4/5 with L LE critical ischemia with non-healing L foot wounds, pt s/p Left superficial femoral artery to peroneal artery bypass.  PMHx:  NSTEM with stenting,, COVID 19, CAD  Clinical Impression  Pt admitted with/for L LE fem/peroneal BPG.  Pain is limiting fluid gait, but expect quick recovery.  Pt currently limited functionally due to the problems listed below.  (see problems list.)  Pt will benefit from PT to maximize function and safety to be able to get home safely with available assist .     Follow Up Recommendations No PT follow up    Equipment Recommendations  None recommended by PT    Recommendations for Other Services       Precautions / Restrictions Precautions Precautions: Fall Restrictions Weight Bearing Restrictions: No      Mobility  Bed Mobility Overal bed mobility: Modified Independent                  Transfers Overall transfer level: Needs assistance   Transfers: Sit to/from Stand Sit to Stand: Min guard         General transfer comment: appropriate use of UE's  Ambulation/Gait Ambulation/Gait assistance: Min guard Gait Distance (Feet): 110 Feet Assistive device: Rolling walker (2 wheeled) Gait Pattern/deviations: Step-to pattern;Step-through pattern Gait velocity: Decreased Gait velocity interpretation: <1.8 ft/sec, indicate of risk for recurrent falls General Gait Details: stable antalgica gait with moderate to heavy use of the RW.  Notable improvement as distance progressed.  Stairs            Wheelchair Mobility    Modified Rankin (Stroke Patients Only)       Balance Overall balance assessment: Needs assistance Sitting-balance support: No upper extremity supported Sitting balance-Leahy Scale: Good     Standing balance support: No upper  extremity supported;Bilateral upper extremity supported Standing balance-Leahy Scale: Fair Standing balance comment: Can static stand without UE support.                             Pertinent Vitals/Pain Pain Assessment: Faces Faces Pain Scale: Hurts even more Pain Location: leg incisions Pain Descriptors / Indicators: Discomfort;Grimacing;Guarding;Numbness Pain Intervention(s): Monitored during session;Premedicated before session    Home Living Family/patient expects to be discharged to:: Private residence Living Arrangements: Children Available Help at Discharge: Family;Available PRN/intermittently Type of Home: Mobile home Home Access: Ramped entrance     Home Layout: One level Home Equipment: Walker - 2 wheels Additional Comments: Pt reports his children are 85 and 48 yo.    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  w/bearing antalgic, but functional.       Communication   Communication: No difficulties  Cognition Arousal/Alertness: Awake/alert Behavior During Therapy: Flat affect Overall Cognitive Status: Within Functional Limits for tasks assessed  General Comments General comments (skin integrity, edema, etc.): vss, sats 93% on RA during ambulation in the halls    Exercises Other Exercises Other Exercises: warm up L LE ROM exercise   Assessment/Plan    PT Assessment Patient needs continued PT services  PT Problem List Decreased activity tolerance;Decreased mobility;Decreased  knowledge of use of DME;Pain;Decreased range of motion       PT Treatment Interventions DME instruction;Gait training;Functional mobility training;Therapeutic activities;Patient/family education    PT Goals (Current goals can be found in the Care Plan section)  Acute Rehab PT Goals Patient Stated Goal: Home soon, but not (my focus) today PT Goal Formulation: With patient Time For Goal Achievement: 02/05/21 Potential to Achieve Goals: Good    Frequency Min 3X/week   Barriers to discharge        Co-evaluation               AM-PAC PT "6 Clicks" Mobility  Outcome Measure Help needed turning from your back to your side while in a flat bed without using bedrails?: None Help needed moving from lying on your back to sitting on the side of a flat bed without using bedrails?: None Help needed moving to and from a bed to a chair (including a wheelchair)?: A Little Help needed standing up from a chair using your arms (e.g., wheelchair or bedside chair)?: A Little Help needed to walk in hospital room?: A Little Help needed climbing 3-5 steps with a railing? : A Little 6 Click Score: 20    End of Session   Activity Tolerance: Patient tolerated treatment well Patient left: in bed;with call bell/phone within reach Nurse Communication: Mobility status PT Visit Diagnosis: Unsteadiness on feet (R26.81);Pain Pain - Right/Left: Left Pain - part of body: Leg    Time: 1016-1040 PT Time Calculation (min) (ACUTE ONLY): 24 min   Charges:   PT Evaluation $PT Eval Moderate Complexity: 1 Mod PT Treatments $Gait Training: 8-22 mins        02/06/2021  Jacinto Halim., PT Acute Rehabilitation Services [phone]  (pager) [phone]  (office)  Eliseo Gum Liberato Stansbery 02/06/2021, 11:00 AM

## 2021-02-06 NOTE — Anesthesia Postprocedure Evaluation (Signed)
Anesthesia Post Note  Patient: Evan Rodriguez  Procedure(s) Performed: LEFT SUPERFICIAL FEMORAL-PERONEAL ARTERY BYPASS GRAFT WITH SUBFACIAL, NON-REVERSED  VEIN. (Left Leg Upper) INTRA OPERATIVE ARTERIOGRAM- LEFT LOWER EXTREMITY (Left Leg Upper)     Patient location during evaluation: PACU Anesthesia Type: General Level of consciousness: awake and alert Pain management: pain level controlled Vital Signs Assessment: post-procedure vital signs reviewed and stable Respiratory status: spontaneous breathing, nonlabored ventilation, respiratory function stable and patient connected to nasal cannula oxygen Cardiovascular status: blood pressure returned to baseline and stable Postop Assessment: no apparent nausea or vomiting Anesthetic complications: no   No complications documented.  Last Vitals:  Vitals:   02/06/21 0305 02/06/21 0405  BP: 90/70 98/69  Pulse: 76 85  Resp: 16 15  Temp: 36.5 C 36.8 C  SpO2: 92% 93%    Last Pain:  Vitals:   02/06/21 0406  TempSrc:   PainSc: Asleep                 Nelle Don Namiko Pritts

## 2021-02-07 MED ORDER — HYDROCODONE-ACETAMINOPHEN 5-325 MG PO TABS: ORAL_TABLET | ORAL | 0 refills | Status: DC

## 2021-02-07 NOTE — Progress Notes (Signed)
Discharge instructions (including medications) discussed with and copy provided to patient/caregiver 

## 2021-02-07 NOTE — Progress Notes (Addendum)
   VASCULAR SURGERY ASSESSMENT & PLAN:      POD 2 LEFT SFA TO PERONEAL BYPASS (VEIN): His bypass graft is patent with brisk Doppler signals in the left foot. PER > DP/PT. he has peroneal runoff only.  VASCULAR QUALITY INITIATIVE: He is on aspirin and is on a statin.  DVT PROPHYLAXIS: He is on subcu heparin.  DISPOSITION: Home today.  Appreciate PT's help.  I instructed him on how to properly elevate his legs when he is at home as he will likely have some leg swelling.  We have also discussed the importance of tobacco cessation.    [name], MD Office: [phone]   SUBJECTIVE:   No complaints  PHYSICAL EXAM:   Vitals:   02/06/21 1510 02/06/21 2039 02/06/21 2346 02/07/21 0446  BP: 98/71 107/75 104/77 113/80  Pulse: 93 91 93 90  Resp: 14 (!) 24 13 18   Temp: 98.3 F (36.8 C) 98.2 F (36.8 C) 98.4 F (36.9 C) 98.4 F (36.9 C)  TempSrc: Oral Oral Oral Oral  SpO2: 96% 92% 95% 95%  Weight:      Height:       Brisk peroneal signal with a Doppler Incisions look fine.  LABS:    PROBLEM LIST:    Active Problems:   PAD (peripheral artery disease) (HCC)   CURRENT MEDS:   . aspirin EC  81 mg Oral Daily  . Chlorhexidine Gluconate Cloth  6 each Topical Daily  . clotrimazole   Topical BID  . docusate sodium  100 mg Oral Daily  . heparin injection (subcutaneous)  5,000 Units Subcutaneous Q8H  . pantoprazole  40 mg Oral Daily  . rosuvastatin  20 mg Oral Daily    Office: [phone] 02/07/2021

## 2021-02-07 NOTE — Progress Notes (Signed)
Mobility Specialist: Progress Note   02/07/21 1114  Mobility  Activity Ambulated in hall  Level of Assistance Modified independent, requires aide device or extra time  Assistive Device Front wheel walker  Distance Ambulated (ft) 110 ft  Mobility Response Tolerated well  Mobility performed by Mobility specialist  $Mobility charge 1 Mobility   Pre-Mobility: 106 HR, 92% SpO2 Post-Mobility: 117 HR, 97% SpO2  Pt c/o 1/10 pain before ambulation but said he was at 7/10 pain in RLE during ambulation. Pt with shallow breaths during ambulation and was coached through pursed lip breathing, sats as seen above. Pt back to bed after walk.   [facility] Geral Tuch Mobility Specialist Mobility Specialist Phone: [phone]

## 2021-02-07 NOTE — Discharge Summary (Signed)
Bypass Discharge Summary Patient ID: Evan Rodriguez [id number] 44 y.o. 1976/08/07  Admit date: 02/05/2021  Discharge date and time: 02/07/2021 12:37 PM   Admitting Physician: Chuck Hint, MD   Discharge Physician: Chuck Hint, MD  Admission Diagnoses: PAD (peripheral artery disease) [facility]) [I73.9]  Discharge Diagnoses: PAD  Admission Condition: fair  Discharged Condition: good  Indication for Admission:  Evan Rodriguez is a 45 y.o. male who presented with nonhealing wounds of the left foot and evidence of severe infrainguinal arterial occlusive disease.  He had occlusion of the superficial femoral artery and reconstitution of a small peroneal artery only.  It was felt that femoral to peroneal bypass was his only chance for limb salvage.  Hospital Course:  45 year old who was admitted on 02/05/21 and underwent Left superficial femoral artery to peroneal artery bypass (nonreversed translocated saphenous vein graft) with Intraoperative arteriogram by Dr. Edilia Bo. He tolerated the procedure well and was taken to the PACU in stable condition. Patient continued to do well post operatively and was transferred to the floor  Day of surgery he continued to do well. Left foot with good Peroneal, dorsalis pedis and posterior tibial signals. Vitals stable. Incisions clean, dry and intact.   POD#1 he did well overnight with no acute events. Left lower extremity well perfused and warm with doppler signals in left foot. Pain well controlled. Transitioned off of PSA. PT and OT evaluated patient and no follow up was recommended. Patient tolerated ambulating with mobility specialist.   POD#2 patient continued to do well with adequate perfusion of left lower extremity. Good doppler signals. Incisions intact. He remained stable for discharge home. He will continue Aspirin and Statin on discharge. Follow up arranged in 3 weeks with Dr. Edilia Bo   Consults: Wound Care  Treatments: analgesia:  PSA, Hydrocodone, Acetaminophen, anticoagulation: heparin and surgery:   1.  Left superficial femoral artery to peroneal artery bypass (nonreversed translocated saphenous vein graft) 2.  Intraoperative arteriogram   Disposition: Discharge disposition: 01-Home or Self Care       - For [facility] Registry use ---  Post-op:  Wound infection: No  Graft infection: No  Transfusion: No  If yes, 0 units given New Arrhythmia: No Patency judged by: Arly.Keller ] Dopper only, [ ]  Palpable graft pulse, [ ]  Palpable distal pulse, [ ]  ABI inc. > 0.15, [ ]  Duplex D/C Ambulatory Status: Ambulatory  Complications: MI: ] No, [ ]  Troponin only, [ ]  EKG or Clinical CHF: No Resp failure: ] none, [ ]  Pneumonia, [ ]  Ventilator Chg in renal function: ] none, [ ]  Inc. Cr > 0.5, [ ]  Temp. Dialysis, [ ]  Permanent dialysis Stroke: [ X] None, [ ]  Minor, [ ]  Major Return to OR: No  Reason for return to OR: [ ]  Bleeding, [ ]  Infection, [ ]  Thrombosis, [ ]  Revision  Discharge medications: Statin use:  Yes ASA use:  Yes Plavix use:  No, not indicated Beta blocker use: No  for medical reason not indicated Coumadin use: No  for medical reason not indicated    Patient Instructions:  Allergies as of 02/07/2021   No Known Allergies     Medication List    TAKE these medications   Aspirin Low Dose 81 MG EC tablet Generic drug: aspirin TAKE 1 TABLET (81 MG TOTAL) BY MOUTH DAILY. What changed: Another medication with the same name was removed. Continue taking this medication, and follow the directions you see here.   clotrimazole 1 %  cream Commonly known as: LOTRIMIN Apply topically 2 (two) times daily. What changed: Another medication with the same name was removed. Continue taking this medication, and follow the directions you see here.   HYDROcodone-acetaminophen 5-325 MG tablet Commonly known as: NORCO/VICODIN TAKE 1 TABLET BY MOUTH EVERY SIX HOURS AS NEEDED FOR UP TO 3 DAYS FOR MODERATE PAIN.    rosuvastatin 20 MG tablet Commonly known as: Crestor Take 1 tablet (20 mg total) by mouth daily. What changed: Another medication with the same name was removed. Continue taking this medication, and follow the directions you see here.            Discharge Care Instructions  (From admission, onward)         Start     Ordered   02/07/21 0000  Discharge wound care:       Comments: Keep clean and dry   02/07/21 9163         Activity: No driving while taking analgesic medications, no heavy lifting, pushing or pulling for 6 weeks Diet: low fat, low cholesterol diet Wound Care: keep wound clean and dry and may shower daily with mild soap and water. Pat incisions dry  Follow-up with Dr. Edilia Bo in 3 weeks.  Signed: Graceann Congress 02/08/2021 3:13 PM

## 2021-02-07 NOTE — Plan of Care (Signed)
  Problem: Acute Rehab PT Goals(only PT should resolve) Goal: Pt Will Ambulate Outcome: Adequate for Discharge

## 2021-02-11 ENCOUNTER — Telehealth: Payer: Self-pay

## 2021-02-11 NOTE — Telephone Encounter (Signed)
Patient calls nurse line checking on the status of FMLA forms. Patient states he saw Welborn on 3/29 and gave him the paperwork. Per note, Wel[place] placed in PCPs box. Please let me know if you do not have the forms.

## 2021-02-12 NOTE — Telephone Encounter (Signed)
Patient returns call to nurse line to check status of paperwork. Patient is concerned as he needs this paperwork in order to file for STD.   Veronda Prude, RN

## 2021-02-12 NOTE — Telephone Encounter (Signed)
PCP handed me form. A copy was made for batch scanning and placed up front for patient. The patient has been contacted and will come by tomorrow morning.

## 2021-02-18 ENCOUNTER — Other Ambulatory Visit (HOSPITAL_COMMUNITY): Payer: Self-pay

## 2021-02-18 ENCOUNTER — Telehealth: Payer: Self-pay

## 2021-02-18 NOTE — Telephone Encounter (Signed)
Patient calls nurse line requesting refill on Rosuvastatin 20 mg. Rx was initially sent to [facility], however, patient would like rx sent to [facility] on [address]. Called to cancel refills at [facility]. However, pharmacist reports that Graceann Congress, [place] discontinued on 02/07/21.   Please advise if patient is to continue this medication.   Forwarding to patient's PCP and Graceann Congress, PA.  Veronda Prude, RN

## 2021-02-18 NOTE — Telephone Encounter (Signed)
The patient should continue taking his Rosuvastatin. The reason it showed discontinued is because there was a duplicate order. Will allow patients PCP to send refills for the patient to his requested pharmacy. Thank you

## 2021-02-19 MED ORDER — ROSUVASTATIN CALCIUM 20 MG PO TABS: 20.0000 mg | ORAL_TABLET | Freq: Every day | ORAL | 3 refills | Status: DC

## 2021-02-19 NOTE — Telephone Encounter (Signed)
Patient returns call to nurse line regarding rx. Due to discrepancies with medication being discontinued, will forward to PCP to send in additional refills of medication.   Veronda Prude, RN

## 2021-03-04 ENCOUNTER — Encounter: Payer: Self-pay | Admitting: Cardiology

## 2021-03-04 NOTE — Progress Notes (Deleted)
Cardiology Office Note   Date:  03/04/2021   ID:  Evan Rodriguez, DOB 1975-12-25, MRN [id number]  PCP:  Dollene Cleveland, DO  Cardiologist:   No primary care provider on file. Referring:  ***  No chief complaint on file.     History of Present Illness: Evan Rodriguez is a 45 y.o. male who presents for ***    The patient has a history of CAD/NSTEMI with coronary disease and PCI as described below in 2017.  He has not been seen since then in our clinic.  He is being seen by Dr. Edilia Bo for treatment of PVD.  He has a non healing wound on his left foot.  He has severe infrainguinal arterioal occlusive disease.      1.  Left superficial femoral artery to peroneal artery bypass (nonreversed translocated saphenous vein graft) 2.  Intraoperative arteriogram   Past Medical History:  Diagnosis Date  . Coronary artery disease   . COVID-19   . NSTEMI (non-ST elevated myocardial infarction) (HCC) 02/22/2016   Stented  . Vaping nicotine dependence, non-tobacco product 01/22/2021    Past Surgical History:  Procedure Laterality Date  . ABDOMINAL AORTOGRAM N/A 01/23/2021   Procedure: ABDOMINAL AORTOGRAM;  Surgeon: Leonie Douglas, MD;  Location: [facility] INVASIVE CV LAB;  Service: Cardiovascular;  Laterality: N/A;  . BYPASS GRAFT FEMORAL-PERONEAL Left 02/05/2021   Procedure: LEFT SUPERFICIAL FEMORAL-PERONEAL ARTERY BYPASS GRAFT WITH SUBFACIAL, NON-REVERSED  VEIN.;  Surgeon: Chuck Hint, MD;  Location: [facility] OR;  Service: Vascular;  Laterality: Left;  . CARDIAC CATHETERIZATION N/A 02/22/2016   Procedure: Left Heart Cath and Coronary Angiography;  Surgeon: Tonny Bollman, MD;  Location: [facility] INVASIVE CV LAB;  Service: Cardiovascular;  Laterality: N/A;  . CARDIAC CATHETERIZATION N/A 02/22/2016   Procedure: Coronary Stent Intervention;  Surgeon: Tonny Bollman, MD;  Location: [facility] INVASIVE CV LAB;  Service: Cardiovascular;  Laterality: N/A;  . INTRAOPERATIVE ARTERIOGRAM Left 02/05/2021    Procedure: INTRA OPERATIVE ARTERIOGRAM- LEFT LOWER EXTREMITY;  Surgeon: Chuck Hint, MD;  Location: [facility] OR;  Service: Vascular;  Laterality: Left;  . LOWER EXTREMITY ANGIOGRAPHY Left 01/23/2021   Procedure: Lower Extremity Angiography;  Surgeon: Leonie Douglas, MD;  Location: [facility] INVASIVE CV LAB;  Service: Cardiovascular;  Laterality: Left;  . PERCUTANEOUS CORONARY STENT INTERVENTION (PCI-S)    . WRIST SURGERY       Current Outpatient Medications  Medication Sig Dispense Refill  . aspirin 81 MG EC tablet TAKE 1 TABLET (81 MG TOTAL) BY MOUTH DAILY. 30 tablet 0  . clotrimazole (LOTRIMIN) 1 % cream Apply topically 2 (two) times daily. 30 g 0  . HYDROcodone-acetaminophen (NORCO/VICODIN) 5-325 MG tablet TAKE 1 TABLET BY MOUTH EVERY SIX HOURS AS NEEDED FOR UP TO 3 DAYS FOR MODERATE PAIN. 30 tablet 0  . rosuvastatin (CRESTOR) 20 MG tablet Take 1 tablet (20 mg total) by mouth daily. 90 tablet 3   No current facility-administered medications for this visit.    Allergies:   Patient has no known allergies.    Social History:  The patient  reports that he has been smoking cigarettes. He has a 30.00 pack-year smoking history. He quit smokeless tobacco use about 2 years ago.  His smokeless tobacco use included chew. He reports current alcohol use. He reports that he does not use drugs.   Family History:  The patient's ***family history includes Hypertension in his maternal grandfather.    ROS:  Please see the history of present illness.   Otherwise,  review of systems are positive for {NONE DEFAULTED:18576::"none"}.   All other systems are reviewed and negative.    PHYSICAL EXAM: VS:  There were no vitals taken for this visit. , BMI There is no height or weight on file to calculate BMI. GENERAL:  Well appearing HEENT:  Pupils equal round and reactive, fundi not visualized, oral mucosa unremarkable NECK:  No jugular venous distention, waveform within normal limits, carotid upstroke brisk  and symmetric, no bruits, no thyromegaly LYMPHATICS:  No cervical, inguinal adenopathy LUNGS:  Clear to auscultation bilaterally BACK:  No CVA tenderness CHEST:  Unremarkable HEART:  PMI not displaced or sustained,S1 and S2 within normal limits, no S3, no S4, no clicks, no rubs, *** murmurs ABD:  Flat, positive bowel sounds normal in frequency in pitch, no bruits, no rebound, no guarding, no midline pulsatile mass, no hepatomegaly, no splenomegaly EXT:  2 plus pulses throughout, no edema, no cyanosis no clubbing SKIN:  No rashes no nodules NEURO:  Cranial nerves II through XII grossly intact, motor grossly intact throughout PSYCH:  Cognitively intact, oriented to person place and time   CARDIAC CATH 02/22/2016:  Diagnostic Dominance: Right    Intervention       EKG:  EKG {ACTION; IS/IS FBP:10258527} ordered today. The ekg ordered today demonstrates ***   Recent Labs: 02/04/2021: ALT 37 02/06/2021: BUN 9; Creatinine, Ser 0.75; Hemoglobin 11.4; Platelets 247; Potassium 4.4; Sodium 135    Lipid Panel    Component Value Date/Time   CHOL 108 02/06/2021 0215   TRIG 69 02/06/2021 0215   HDL 32 (L) 02/06/2021 0215   CHOLHDL 3.4 02/06/2021 0215   VLDL 14 02/06/2021 0215   LDLCALC 62 02/06/2021 0215      Wt Readings from Last 3 Encounters:  02/05/21 163 lb 9 oz (74.2 kg)  02/04/21 163 lb 9 oz (74.2 kg)  01/29/21 163 lb 6 oz (74.1 kg)      Other studies Reviewed: Additional studies/ records that were reviewed today include: ***. Review of the above records demonstrates:  Please see elsewhere in the note.  ***   ASSESSMENT AND PLAN:  CAD:  ***  TOBACCO ABUSE:  ***  DYSLIPIDEMIA:  ***    PVD:  ***    Current medicines are reviewed at length with the patient today.  The patient {ACTIONS; HAS/DOES NOT HAVE:19233} concerns regarding medicines.  The following changes have been made:  {PLAN; NO CHANGE:13088:s}  Labs/ tests ordered today include: *** No orders of  the defined types were placed in this encounter.    Disposition:   FU with ***    Signed, Rollene Rotunda, MD  03/04/2021 7:01 AM    [facility]

## 2021-03-05 ENCOUNTER — Ambulatory Visit: Payer: 59 | Admitting: Cardiology

## 2021-03-06 ENCOUNTER — Other Ambulatory Visit: Payer: Self-pay

## 2021-03-06 ENCOUNTER — Ambulatory Visit (INDEPENDENT_AMBULATORY_CARE_PROVIDER_SITE_OTHER): Payer: 59 | Admitting: Physician Assistant

## 2021-03-06 VITALS — BP 108/82 | HR 116 | Temp 98.2°F | Resp 20 | Ht 66.0 in | Wt 156.5 lb

## 2021-03-06 DIAGNOSIS — I739 Peripheral vascular disease, unspecified: Secondary | ICD-10-CM

## 2021-03-06 NOTE — Progress Notes (Signed)
    Postoperative Visit   History of Present Illness   Evan Rodriguez is a 45 y.o. year old male who presents for postoperative follow-up for left superficial femoral artery to peroneal artery bypass with saphenous vein by Dr. 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  Incisions of the left lower extremity are healing well per patient.  He continues to have left great toe pain however believes that he wound on his great toe as well as the discoloration of fourth toe are improving.  He is mainly concerned with swelling in his left leg when he is on his feet for any amount of time.  He is on aspirin and statin daily.  He has stopped smoking.   For VQI Use Only   PRE-ADM LIVING: Home  AMB STATUS: Ambulatory   Physical Examination   Vitals:   03/06/21 1521  BP: 108/82  Pulse: (!) 116  Resp: 20  Temp: 98.2 F (36.8 C)  TempSrc: Temporal  SpO2: 96%  Weight: 156 lb 8 oz (71 kg)  Height: 5\' 6"  (1.676 m)    LLE: Incisions are healing well, L GT much improved compared to 02/23/21 picture, multiphasic left peroneal signal as well as monophasic DP and PT signal by Doppler   Medical Decision Making   Krikor Willet is a 45 y.o. year old male who presents s/p left SFA to peroneal artery bypass with vein for wounds of left foot.  -Left foot is well-perfused based on Doppler exam; fourth toe and left great toe wounds are improving -Incisions of left lower extremity are healing well -Encouraged patient to elevate his leg when he is not walking to manage the edema -Plan is for left lower extremity bypass duplex and ABI in the next 2 to 3 weeks  47 PA-C [facility] Office: [phone]  Clinic MD: Dr. [phone] was involved in in evaluation and management plan of this patient today

## 2021-03-07 ENCOUNTER — Other Ambulatory Visit: Payer: Self-pay

## 2021-03-07 DIAGNOSIS — I739 Peripheral vascular disease, unspecified: Secondary | ICD-10-CM

## 2021-03-11 DIAGNOSIS — Z955 Presence of coronary angioplasty implant and graft: Secondary | ICD-10-CM

## 2021-03-11 DIAGNOSIS — Z7982 Long term (current) use of aspirin: Secondary | ICD-10-CM

## 2021-03-11 DIAGNOSIS — I748 Embolism and thrombosis of other arteries: Principal | ICD-10-CM | POA: Diagnosis present

## 2021-03-11 DIAGNOSIS — Z79899 Other long term (current) drug therapy: Secondary | ICD-10-CM

## 2021-03-11 DIAGNOSIS — E871 Hypo-osmolality and hyponatremia: Secondary | ICD-10-CM | POA: Diagnosis present

## 2021-03-11 DIAGNOSIS — I252 Old myocardial infarction: Secondary | ICD-10-CM

## 2021-03-11 DIAGNOSIS — D735 Infarction of spleen: Secondary | ICD-10-CM | POA: Diagnosis present

## 2021-03-11 DIAGNOSIS — I739 Peripheral vascular disease, unspecified: Secondary | ICD-10-CM | POA: Diagnosis present

## 2021-03-11 DIAGNOSIS — I2510 Atherosclerotic heart disease of native coronary artery without angina pectoris: Secondary | ICD-10-CM | POA: Diagnosis present

## 2021-03-11 DIAGNOSIS — I82890 Acute embolism and thrombosis of other specified veins: Secondary | ICD-10-CM | POA: Diagnosis not present

## 2021-03-11 DIAGNOSIS — Z8616 Personal history of COVID-19: Secondary | ICD-10-CM

## 2021-03-11 DIAGNOSIS — F17210 Nicotine dependence, cigarettes, uncomplicated: Secondary | ICD-10-CM | POA: Diagnosis present

## 2021-03-11 DIAGNOSIS — Z86718 Personal history of other venous thrombosis and embolism: Secondary | ICD-10-CM

## 2021-03-11 DIAGNOSIS — Z8249 Family history of ischemic heart disease and other diseases of the circulatory system: Secondary | ICD-10-CM

## 2021-03-12 ENCOUNTER — Other Ambulatory Visit: Payer: Self-pay

## 2021-03-12 ENCOUNTER — Emergency Department (HOSPITAL_COMMUNITY): Payer: 59

## 2021-03-12 ENCOUNTER — Inpatient Hospital Stay (HOSPITAL_COMMUNITY)
Admission: EM | Admit: 2021-03-12 | Discharge: 2021-03-15 | DRG: 300 | Disposition: A | Discharge status: Home / Self Care | Payer: 59 | Attending: Family Medicine | Admitting: Family Medicine

## 2021-03-12 ENCOUNTER — Encounter (HOSPITAL_COMMUNITY): Payer: Self-pay

## 2021-03-12 DIAGNOSIS — I739 Peripheral vascular disease, unspecified: Secondary | ICD-10-CM | POA: Diagnosis present

## 2021-03-12 DIAGNOSIS — Z8249 Family history of ischemic heart disease and other diseases of the circulatory system: Secondary | ICD-10-CM | POA: Diagnosis not present

## 2021-03-12 DIAGNOSIS — E871 Hypo-osmolality and hyponatremia: Secondary | ICD-10-CM | POA: Diagnosis present

## 2021-03-12 DIAGNOSIS — Z955 Presence of coronary angioplasty implant and graft: Secondary | ICD-10-CM | POA: Diagnosis not present

## 2021-03-12 DIAGNOSIS — I2510 Atherosclerotic heart disease of native coronary artery without angina pectoris: Secondary | ICD-10-CM | POA: Diagnosis present

## 2021-03-12 DIAGNOSIS — I748 Embolism and thrombosis of other arteries: Principal | ICD-10-CM | POA: Diagnosis present

## 2021-03-12 DIAGNOSIS — Z86718 Personal history of other venous thrombosis and embolism: Secondary | ICD-10-CM | POA: Diagnosis not present

## 2021-03-12 DIAGNOSIS — R109 Unspecified abdominal pain: Secondary | ICD-10-CM

## 2021-03-12 DIAGNOSIS — Z79899 Other long term (current) drug therapy: Secondary | ICD-10-CM | POA: Diagnosis not present

## 2021-03-12 DIAGNOSIS — I82890 Acute embolism and thrombosis of other specified veins: Secondary | ICD-10-CM

## 2021-03-12 DIAGNOSIS — Z7982 Long term (current) use of aspirin: Secondary | ICD-10-CM | POA: Diagnosis not present

## 2021-03-12 DIAGNOSIS — D730 Hyposplenism: Secondary | ICD-10-CM

## 2021-03-12 DIAGNOSIS — D735 Infarction of spleen: Secondary | ICD-10-CM

## 2021-03-12 DIAGNOSIS — F17210 Nicotine dependence, cigarettes, uncomplicated: Secondary | ICD-10-CM | POA: Diagnosis present

## 2021-03-12 DIAGNOSIS — F17200 Nicotine dependence, unspecified, uncomplicated: Secondary | ICD-10-CM | POA: Diagnosis present

## 2021-03-12 DIAGNOSIS — R140 Abdominal distension (gaseous): Secondary | ICD-10-CM

## 2021-03-12 DIAGNOSIS — Z8616 Personal history of COVID-19: Secondary | ICD-10-CM | POA: Diagnosis not present

## 2021-03-12 DIAGNOSIS — F17211 Nicotine dependence, cigarettes, in remission: Secondary | ICD-10-CM | POA: Diagnosis present

## 2021-03-12 DIAGNOSIS — I252 Old myocardial infarction: Secondary | ICD-10-CM | POA: Diagnosis not present

## 2021-03-12 HISTORY — DX: Unspecified abdominal pain: R10.9

## 2021-03-12 HISTORY — DX: Hypo-osmolality and hyponatremia: E87.1

## 2021-03-12 LAB — LIPASE, BLOOD: Lipase: 30 U/L (ref 11–51)

## 2021-03-12 LAB — CBC
HCT: 45 % (ref 39.0–52.0)
Hemoglobin: 14.9 g/dL (ref 13.0–17.0)
MCH: 30.2 pg (ref 26.0–34.0)
MCHC: 33.1 g/dL (ref 30.0–36.0)
MCV: 91.3 fL (ref 80.0–100.0)
Platelets: 186 10*3/uL (ref 150–400)
RBC: 4.93 MIL/uL (ref 4.22–5.81)
RDW: 13.1 % (ref 11.5–15.5)
WBC: 12.2 10*3/uL — ABNORMAL HIGH (ref 4.0–10.5)
nRBC: 0 % (ref 0.0–0.2)

## 2021-03-12 LAB — COMPREHENSIVE METABOLIC PANEL
ALT: 20 U/L (ref 0–44)
AST: 16 U/L (ref 15–41)
Albumin: 3.7 g/dL (ref 3.5–5.0)
Alkaline Phosphatase: 91 U/L (ref 38–126)
Anion gap: 7 (ref 5–15)
BUN: 12 mg/dL (ref 6–20)
CO2: 25 mmol/L (ref 22–32)
Calcium: 9.5 mg/dL (ref 8.9–10.3)
Chloride: 101 mmol/L (ref 98–111)
Creatinine, Ser: 0.77 mg/dL (ref 0.61–1.24)
GFR, Estimated: >60 mL/min (ref >60)
Glucose, Bld: 161 mg/dL — ABNORMAL HIGH (ref 70–99)
Potassium: 4.1 mmol/L (ref 3.5–5.1)
Sodium: 133 mmol/L — ABNORMAL LOW (ref 135–145)
Total Bilirubin: 0.5 mg/dL (ref 0.3–1.2)
Total Protein: 8.6 g/dL — ABNORMAL HIGH (ref 6.5–8.1)

## 2021-03-12 LAB — APTT: aPTT: 31 seconds (ref 24–36)

## 2021-03-12 LAB — POC OCCULT BLOOD, ED: Fecal Occult Bld: NEGATIVE

## 2021-03-12 LAB — PROTIME-INR
INR: 1.1 (ref 0.8–1.2)
Prothrombin Time: 14.4 seconds (ref 11.4–15.2)

## 2021-03-12 LAB — RESP PANEL BY RT-PCR (FLU A&B, COVID) ARPGX2
Influenza A by PCR: NEGATIVE
Influenza B by PCR: NEGATIVE
SARS Coronavirus 2 by RT PCR: NEGATIVE

## 2021-03-12 MED ORDER — HYDROMORPHONE HCL 1 MG/ML IJ SOLN
1.0000 mg | INTRAMUSCULAR | Status: AC | PRN
Start: 2021-03-12 — End: 2021-03-13
  Administered 2021-03-12 – 2021-03-13 (×4): 1 mg via INTRAVENOUS
  Filled 2021-03-12 (×4): qty 1

## 2021-03-12 MED ORDER — SENNOSIDES-DOCUSATE SODIUM 8.6-50 MG PO TABS
1.0000 | ORAL_TABLET | Freq: Two times a day (BID) | ORAL | Status: DC
  Administered 2021-03-14 – 2021-03-15 (×2): 1 via ORAL
  Filled 2021-03-12 (×4): qty 1

## 2021-03-12 MED ORDER — ONDANSETRON HCL 4 MG PO TABS: 4.0000 mg | ORAL_TABLET | Freq: Four times a day (QID) | ORAL | Status: DC | PRN

## 2021-03-12 MED ORDER — FENTANYL CITRATE (PF) 100 MCG/2ML IJ SOLN
100.0000 ug | Freq: Once | INTRAMUSCULAR | Status: AC
  Administered 2021-03-12: 100 ug via INTRAVENOUS
  Filled 2021-03-12: qty 2

## 2021-03-12 MED ORDER — IOHEXOL 300 MG/ML  SOLN
100.0000 mL | Freq: Once | INTRAMUSCULAR | Status: AC | PRN
  Administered 2021-03-12: 100 mL via INTRAVENOUS

## 2021-03-12 MED ORDER — SODIUM CHLORIDE 0.9 % IV SOLN
12.5000 mg | Freq: Four times a day (QID) | INTRAVENOUS | Status: DC | PRN
  Administered 2021-03-12 – 2021-03-14 (×5): 12.5 mg via INTRAVENOUS
  Filled 2021-03-12: qty 0.5
  Filled 2021-03-12 (×5): qty 12.5

## 2021-03-12 MED ORDER — SODIUM CHLORIDE 0.9 % IV BOLUS (SEPSIS)
1000.0000 mL | Freq: Once | INTRAVENOUS | Status: AC
  Administered 2021-03-12: 1000 mL via INTRAVENOUS

## 2021-03-12 MED ORDER — POLYETHYLENE GLYCOL 3350 17 G PO PACK
17.0000 g | PACK | Freq: Every day | ORAL | Status: DC
  Administered 2021-03-13 – 2021-03-14 (×2): 17 g via ORAL
  Filled 2021-03-12 (×2): qty 1

## 2021-03-12 MED ORDER — ONDANSETRON HCL 4 MG/2ML IJ SOLN
4.0000 mg | Freq: Four times a day (QID) | INTRAMUSCULAR | Status: DC | PRN
  Administered 2021-03-12 – 2021-03-14 (×2): 4 mg via INTRAVENOUS

## 2021-03-12 MED ORDER — ENOXAPARIN SODIUM 80 MG/0.8ML IJ SOSY
1.0000 mg/kg | PREFILLED_SYRINGE | Freq: Two times a day (BID) | INTRAMUSCULAR | Status: DC
  Administered 2021-03-12 – 2021-03-14 (×5): 70 mg via SUBCUTANEOUS
  Filled 2021-03-12 (×5): qty 0.8

## 2021-03-12 MED ORDER — LACTATED RINGERS IV SOLN: INTRAVENOUS | Status: DC

## 2021-03-12 MED ORDER — ONDANSETRON HCL 4 MG/2ML IJ SOLN
4.0000 mg | Freq: Four times a day (QID) | INTRAMUSCULAR | Status: DC | PRN
  Filled 2021-03-12 (×2): qty 2

## 2021-03-12 MED ORDER — OXYCODONE-ACETAMINOPHEN 7.5-325 MG PO TABS
1.0000 | ORAL_TABLET | Freq: Four times a day (QID) | ORAL | Status: DC | PRN
  Administered 2021-03-13 – 2021-03-14 (×2): 1 via ORAL
  Filled 2021-03-12 (×2): qty 1

## 2021-03-12 MED ORDER — BOOST / RESOURCE BREEZE PO LIQD CUSTOM
1.0000 | Freq: Three times a day (TID) | ORAL | Status: DC
  Administered 2021-03-14: 1 via ORAL

## 2021-03-12 MED ORDER — NICOTINE 14 MG/24HR TD PT24
14.0000 mg | MEDICATED_PATCH | Freq: Every day | TRANSDERMAL | Status: DC
  Administered 2021-03-12 – 2021-03-14 (×3): 14 mg via TRANSDERMAL
  Filled 2021-03-12 (×4): qty 1

## 2021-03-12 MED ORDER — ONDANSETRON HCL 4 MG/2ML IJ SOLN
4.0000 mg | Freq: Once | INTRAMUSCULAR | Status: AC
  Administered 2021-03-12: 4 mg via INTRAVENOUS
  Filled 2021-03-12: qty 2

## 2021-03-12 MED ORDER — ASPIRIN EC 81 MG PO TBEC
81.0000 mg | DELAYED_RELEASE_TABLET | Freq: Every day | ORAL | Status: DC
  Administered 2021-03-12 – 2021-03-15 (×4): 81 mg via ORAL
  Filled 2021-03-12 (×4): qty 1

## 2021-03-12 MED ORDER — ROSUVASTATIN CALCIUM 10 MG PO TABS
20.0000 mg | ORAL_TABLET | Freq: Every day | ORAL | Status: DC
  Administered 2021-03-12 – 2021-03-15 (×4): 20 mg via ORAL
  Filled 2021-03-12 (×4): qty 2

## 2021-03-12 NOTE — H&P (Signed)
History and Physical    Evan Rodriguez WIO:[id number] DOB: 1976-09-04 DOA: 03/12/2021  PCP: Dollene Cleveland, DO   Patient coming from: Home  Chief Complaint: Abdominal pain  HPI: Evan Rodriguez is a 45 y.o. male with medical history significant for PAD, CAD, NSTEMI s/p stent who presents with two week history of abdominal pain.  He reports abdominal pain is been in the middle and lower part of his abdomen on the left side more than the right side.  Is been associated with nausea but no vomiting or diarrhea.  He has not had any injury or trauma to his abdomen.  He has no history of pancreatitis and reports infrequent alcohol use but none recently.  He reports the pain is intermittent the first week that that that it started but for the past 5 to 6 days it has been more constant and severe.  He reports the pain does not radiate.  He states it is a sharp cramping type pain.  10 out of 10 at its worst.  It is worsened with movement and bending or palpating his stomach.  Nothing relieves the pain at home.  IV pain medication given in the emergency room has eased the pain tonight.  States he has had a blood clot in his left leg and recently had vascular surgery perform an operation to remove a clot.  Not a complication since the surgery.  He knows of no family history of clotting disease.  He is unsure if he was ever tested for genetic clotting disorders.  He has not had chest pain, palpitations, shortness of breath. Lives with family and reports numerous family members had a viral virus last week.  ED Course: Mr. Niswander has been hemodynamically stable in the emergency room.  Improved with IV pain medication.  WBC mildly elevated at 12,200.  Hemoglobin 14.9, hematocrit 45, platelets 186.  Sodium 133 potassium 4.1 chloride 101 bicarb 25 glucose 161 creatinine 0.77 BUN 12, normal LFTs, INR 1.1.  CT of the abdomen pelvis revealed splenic artery and vein occlusion with thrombosis with splenic infarct.   Hospitalist service asked to admit for further management  Review of Systems:  General: Denies fever, chills, weight loss, night sweats.  Denies dizziness. Reports decreased appetite HENT: Denies head trauma, headache, denies change in hearing, tinnitus.  Denies nasal congestion or bleeding.  Denies sore throat, sores in mouth.  Denies difficulty swallowing Eyes: Denies blurry vision, pain in eye, drainage.  Denies discoloration of eyes. Neck: Denies pain.  Denies swelling.  Denies pain with movement. Cardiovascular: Denies chest pain, palpitations. Denies edema. Denies orthopnea Respiratory: Denies shortness of breath, cough. Denies wheezing. Denies sputum production Gastrointestinal: Reports abdominal pain with nausea. Denies vomiting, diarrhea.  Denies melena.  Denies hematemesis. Musculoskeletal: Denies limitation of movement. Denies deformity or swelling. Denies arthralgias or myalgias. Genitourinary: Denies pelvic pain.  Denies urinary frequency or hesitancy. Denies dysuria.  Skin: Denies rash. Denies petechiae, purpura, ecchymosis. Neurological: Denies syncope.  Denies seizure activity. Denies paresthesia. Denies slurred speech, drooping face.  Denies visual change. Psychiatric: Denies depression, anxiety. Denies hallucinations.  Past Medical History:  Diagnosis Date  . Coronary artery disease   . COVID-19   . NSTEMI (non-ST elevated myocardial infarction) (HCC) 02/22/2016   Stented  . Vaping nicotine dependence, non-tobacco product 01/22/2021    Past Surgical History:  Procedure Laterality Date  . ABDOMINAL AORTOGRAM N/A 01/23/2021   Procedure: ABDOMINAL AORTOGRAM;  Surgeon: Leonie Douglas, MD;  Location: [facility] INVASIVE CV LAB;  Service: Cardiovascular;  Laterality: N/A;  . BYPASS GRAFT FEMORAL-PERONEAL Left 02/05/2021   Procedure: LEFT SUPERFICIAL FEMORAL-PERONEAL ARTERY BYPASS GRAFT WITH SUBFACIAL, NON-REVERSED  VEIN.;  Surgeon: Chuck Hint, MD;  Location: [facility] OR;   Service: Vascular;  Laterality: Left;  . CARDIAC CATHETERIZATION N/A 02/22/2016   Procedure: Left Heart Cath and Coronary Angiography;  Surgeon: Tonny Bollman, MD;  Location: [facility] INVASIVE CV LAB;  Service: Cardiovascular;  Laterality: N/A;  . CARDIAC CATHETERIZATION N/A 02/22/2016   Procedure: Coronary Stent Intervention;  Surgeon: Tonny Bollman, MD;  Location: [facility] INVASIVE CV LAB;  Service: Cardiovascular;  Laterality: N/A;  . INTRAOPERATIVE ARTERIOGRAM Left 02/05/2021   Procedure: INTRA OPERATIVE ARTERIOGRAM- LEFT LOWER EXTREMITY;  Surgeon: Chuck Hint, MD;  Location: [facility] OR;  Service: Vascular;  Laterality: Left;  . LOWER EXTREMITY ANGIOGRAPHY Left 01/23/2021   Procedure: Lower Extremity Angiography;  Surgeon: Leonie Douglas, MD;  Location: [facility] INVASIVE CV LAB;  Service: Cardiovascular;  Laterality: Left;  . PERCUTANEOUS CORONARY STENT INTERVENTION (PCI-S)    . WRIST SURGERY      Social History  reports that he has been smoking cigarettes. He has a 30.00 pack-year smoking history. He quit smokeless tobacco use about 2 years ago.  His smokeless tobacco use included chew. He reports current alcohol use. He reports that he does not use drugs.  No Known Allergies  Family History  Problem Relation Age of Onset  . Hypertension Maternal Grandfather      Prior to Admission medications   Medication Sig Start Date End Date Taking? Authorizing Provider  aspirin 81 MG EC tablet TAKE 1 TABLET (81 MG TOTAL) BY MOUTH DAILY. 01/24/21 01/24/22 Yes Towanda Octave, MD  clotrimazole (LOTRIMIN) 1 % cream Apply topically 2 (two) times daily. 01/24/21  Yes Towanda Octave, MD  HYDROcodone-acetaminophen (NORCO/VICODIN) 5-325 MG tablet TAKE 1 TABLET BY MOUTH EVERY SIX HOURS AS NEEDED FOR UP TO 3 DAYS FOR MODERATE PAIN. 02/07/21  Yes Baglia, Corrina, PA-C  rosuvastatin (CRESTOR) 20 MG tablet Take 1 tablet (20 mg total) by mouth daily. 02/19/21  Yes Dollene Cleveland, DO    Physical Exam: Vitals:   03/12/21  0011 03/12/21 0145 03/12/21 0248  BP: 123/84 (!) 116/94 107/79  Pulse: (!) 115 (!) 107 97  Resp: 18 (!) 22 19  Temp: 98.5 F (36.9 C)    TempSrc: Oral    SpO2: 98% 92% 95%    Constitutional: NAD, calm, comfortable Vitals:   03/12/21 0011 03/12/21 0145 03/12/21 0248  BP: 123/84 (!) 116/94 107/79  Pulse: (!) 115 (!) 107 97  Resp: 18 (!) 22 19  Temp: 98.5 F (36.9 C)    TempSrc: Oral    SpO2: 98% 92% 95%   General: WDWN, Alert and oriented x3.  Eyes: EOMI, PERRL, conjunctivae normal.  Sclera nonicteric HENT:  [place]/AT, external ears normal.  Nares patent without epistasis.  Mucous membranes are moist.  Neck: Soft, normal range of motion, supple, no masses, Trachea midline Respiratory:  Equal breath sounds. Diffuse scattered rales. no wheezing, no crackles. Normal respiratory effort. No accessory muscle use.  Cardiovascular: Regular rate and rhythm, no murmurs / rubs / gallops. No extremity edema. 1+ pedal pulses. Abdomen: Soft, mid and lower abdomen tenderness, nondistended, no rebound or guarding.  No masses palpated. Bowel sounds normoactive Musculoskeletal: FROM. Has clubbing of digits. no cyanosis. No joint deformity upper and lower extremities. Normal muscle tone.  Skin: Warm, dry, intact no rashes, lesions, ulcers. No induration. Healling surgical incision on medial left  thigh and leg. Neurologic: CN 2-12 grossly intact. Normal speech. Sensation intact, Strength 5/5 in all extremities.   Psychiatric: Normal judgment and insight.  Normal mood.    Labs on Admission: I have personally reviewed following labs and imaging studies  CBC: Recent Labs  Lab 03/12/21 0016  WBC 12.2*  HGB 14.9  HCT 45.0  MCV 91.3  PLT 186    Basic Metabolic Panel: Recent Labs  Lab 03/12/21 0016  NA 133*  K 4.1  CL 101  CO2 25  GLUCOSE 161*  BUN 12  CREATININE 0.77  CALCIUM 9.5    GFR: Estimated Creatinine Clearance: 104.1 mL/min (by C-G formula based on SCr of 0.77 mg/dL).  Liver  Function Tests: Recent Labs  Lab 03/12/21 0016  AST 16  ALT 20  ALKPHOS 91  BILITOT 0.5  PROT 8.6*  ALBUMIN 3.7    Urine analysis:    Component Value Date/Time   COLORURINE YELLOW 02/04/2021 1129   APPEARANCEUR HAZY (A) 02/04/2021 1129   LABSPEC 1.020 02/04/2021 1129   PHURINE 6.0 02/04/2021 1129   GLUCOSEU NEGATIVE 02/04/2021 1129   HGBUR NEGATIVE 02/04/2021 1129   BILIRUBINUR NEGATIVE 02/04/2021 1129   KETONESUR NEGATIVE 02/04/2021 1129   PROTEINUR NEGATIVE 02/04/2021 1129   NITRITE NEGATIVE 02/04/2021 1129   LEUKOCYTESUR NEGATIVE 02/04/2021 1129    Radiological Exams on Admission: CT ABDOMEN PELVIS W CONTRAST  Result Date: 03/12/2021 CLINICAL DATA:  45 year old male with abdominal pain. EXAM: CT ABDOMEN AND PELVIS WITH CONTRAST TECHNIQUE: Multidetector CT imaging of the abdomen and pelvis was performed using the standard protocol following bolus administration of intravenous contrast. CONTRAST:  100mL OMNIPAQUE IOHEXOL 300 MG/ML  SOLN COMPARISON:  None. FINDINGS: Lower chest: Left lung base linear atelectasis/scarring. The visualized lung bases are otherwise clear. No intra-abdominal free air or free fluid. Hepatobiliary: No focal liver abnormality is seen. No gallstones, gallbladder wall thickening, or biliary dilatation. Pancreas: The pancreas is unremarkable. Spleen: There is diffuse hypoenhancement of the spleen consistent with splenic infarct. Adrenals/Urinary Tract: The adrenal glands are unremarkable. There is no hydronephrosis on either side. There is symmetric enhancement and excretion of contrast by both kidneys. The visualized ureters and urinary bladder appear unremarkable. Stomach/Bowel: There is no bowel obstruction or active inflammation. The appendix is normal. Vascular/Lymphatic: Mild calcified and noncalcified plaque of the distal abdominal aorta and iliac arteries. The IVC is unremarkable. There is thrombosis of the splenic vein from the splenic hilum into the  porta splenic confluence. There is dilatation of the vein suggestive of acute thrombus. The SMV and main portal vein are patent. No portal venous gas. The splenic artery also appears thrombosed. Findings may be sequela of prior pancreatitis. Clinical correlation is recommended. Reproductive: The prostate and seminal vesicles are grossly unremarkable. Other: None Musculoskeletal: No acute or significant osseous findings. IMPRESSION: 1. 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 This may be sequela of prior pancreatitis. Clinical correlation is recommended. 2. No bowel obstruction. Normal appendix. 3. Aortic Atherosclerosis (ICD10-I70.0). Electronically Signed   By: Elgie CollardArash  Radparvar M.D.   On: 03/12/2021 02:09    EKG: Independently reviewed.  EKG shows sinus tachycardia with no acute ST elevation or depression.  QTc 466  Assessment/Plan Principal Problem:   Thrombosis of splenic artery  Mr. Jordan LikesSpivey is started on Lovenox 1 mg/kg twice daily for therapeutic anticoagulation. Coagulation labs with protein C, protein S, antiphospholipid and and lupus anticoagulant drawn before starting anticoagulation.   Patient has no etiology for the thrombosis  of his splenic artery.  He denies any history of pancreatitis or abdominal trauma. Control with IV Dilaudid provided overnight. 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  CT reads as diffuse focal enhancement of spleen consistent with infarction Will need surgical consultation in the morning to determine if splenectomy is indicated    Abdominal pain Pain control provided overnight.  IV fluid hydration.    Hyponatremia Mild. IVF hydration. Recheck electrolytes and renal function in am.     Tobacco dependence Nicotine patch provided to prevent withdrawal    DVT prophylaxis: Patient is placed on therapeutic Lovenox dose Code Status:    Full code Family Communication:  Diagnosis and plan discussed with patient.  Patient verbalized understanding agrees with plan.  Further recommendations to follow as clinical indicated Disposition Plan:   Patient is from:  Home  Anticipated DC to:  Home  Anticipated DC date:  Anticipate 2 midnight or more stay in the hospital to treat acute condition  Anticipated DC barriers: No barriers to discharge identified at this time  Admission status:  Inpatient   Claudean Severance Kaida Games MD [facility]  How to contact the [facility] Attending or Consulting provider 7A - 7P or covering provider during after hours 7P -7A, for this patient?   1. Check the care team in [facility] and look for a) attending/consulting [facility] provider listed and b) the [facility] team listed 2. Log into [email or web address] and use [place]'s universal password to access. If you do not have the password, please contact the hospital operator. 3. Locate the [facility] provider you are looking for under [facility] and page to a number that you can be directly reached. 4. If you still have difficulty reaching the provider, please page the [facility] (Director on Call) for the Hospitalists listed on amion for assistance.  03/12/2021, 4:04 AM

## 2021-03-12 NOTE — ED Triage Notes (Signed)
Pt reports lower abdominal pain for 2 weeks. Sts he is unsure when his last bowel movement was but it has been a while.

## 2021-03-12 NOTE — Consult Note (Signed)
Hospital Consult    Reason for Consult:  Splenic artery/vein thrombosis Referring Physician:  Dr. Renford Dills MRN #:  [id number]  History of Present Illness: This is a 45 y.o. male recent history of left femoral to peroneal bypass for toe ulceration.  Over the past 2 weeks he states he has had lower abdominal pain that has been dull and nonradiating.  He has never had pain like this before.  Over the past couple days he has had difficulty with eating with no appetite.  He denies any vomiting.  He has never had any blood clotting issues with family history of blood clotting issues.  He is a current everyday smoker.  Does not drink alcohol.  Denies any history of pancreatitis, diverticulitis or abdominal surgery.  Mild pain is in the bilateral lower quadrants nothing in the upper quadrants.  He is on aspirin and a statin daily.  Past Medical History:  Diagnosis Date  . Coronary artery disease   . COVID-19   . NSTEMI (non-ST elevated myocardial infarction) (HCC) 02/22/2016   Stented  . Vaping nicotine dependence, non-tobacco product 01/22/2021    Past Surgical History:  Procedure Laterality Date  . ABDOMINAL AORTOGRAM N/A 01/23/2021   Procedure: ABDOMINAL AORTOGRAM;  Surgeon: Leonie Douglas, MD;  Location: [facility] INVASIVE CV LAB;  Service: Cardiovascular;  Laterality: N/A;  . BYPASS GRAFT FEMORAL-PERONEAL Left 02/05/2021   Procedure: LEFT SUPERFICIAL FEMORAL-PERONEAL ARTERY BYPASS GRAFT WITH SUBFACIAL, NON-REVERSED  VEIN.;  Surgeon: Chuck Hint, MD;  Location: [facility] OR;  Service: Vascular;  Laterality: Left;  . CARDIAC CATHETERIZATION N/A 02/22/2016   Procedure: Left Heart Cath and Coronary Angiography;  Surgeon: Tonny Bollman, MD;  Location: [facility] INVASIVE CV LAB;  Service: Cardiovascular;  Laterality: N/A;  . CARDIAC CATHETERIZATION N/A 02/22/2016   Procedure: Coronary Stent Intervention;  Surgeon: Tonny Bollman, MD;  Location: [facility] INVASIVE CV LAB;  Service: Cardiovascular;  Laterality: N/A;   . INTRAOPERATIVE ARTERIOGRAM Left 02/05/2021   Procedure: INTRA OPERATIVE ARTERIOGRAM- LEFT LOWER EXTREMITY;  Surgeon: Chuck Hint, MD;  Location: [facility] OR;  Service: Vascular;  Laterality: Left;  . LOWER EXTREMITY ANGIOGRAPHY Left 01/23/2021   Procedure: Lower Extremity Angiography;  Surgeon: Leonie Douglas, MD;  Location: [facility] INVASIVE CV LAB;  Service: Cardiovascular;  Laterality: Left;  . PERCUTANEOUS CORONARY STENT INTERVENTION (PCI-S)    . WRIST SURGERY      No Known Allergies  Prior to Admission medications   Medication Sig Start Date End Date Taking? Authorizing Provider  aspirin 81 MG EC tablet TAKE 1 TABLET (81 MG TOTAL) BY MOUTH DAILY. 01/24/21 01/24/22 Yes Towanda Octave, MD  clotrimazole (LOTRIMIN) 1 % cream Apply topically 2 (two) times daily. 01/24/21  Yes Towanda Octave, MD  HYDROcodone-acetaminophen (NORCO/VICODIN) 5-325 MG tablet TAKE 1 TABLET BY MOUTH EVERY SIX HOURS AS NEEDED FOR UP TO 3 DAYS FOR MODERATE PAIN. 02/07/21  Yes Baglia, Corrina, PA-C  rosuvastatin (CRESTOR) 20 MG tablet Take 1 tablet (20 mg total) by mouth daily. 02/19/21  Yes Dollene Cleveland, DO    Social History   Socioeconomic History  . Marital status: Widowed    Spouse name: Not on file  . Number of children: Not on file  . Years of education: Not on file  . Highest education level: Not on file  Occupational History  . Occupation: [employer] doctor  Tobacco Use  . Smoking status: Current Every Day Smoker    Packs/day: 1.50    Years: 20.00    Pack years: 30.00  Types: Cigarettes  . Smokeless tobacco: Former Neurosurgeon    Types: Chew    Quit date: 02/05/2019  . Tobacco comment: quit chew tobacco in 2020  Vaping Use  . Vaping Use: Some days  . Devices: [employer]; refillable.   Substance and Sexual Activity  . Alcohol use: Yes    Comment: occasionally/monthly  . Drug use: No  . Sexual activity: Not on file  Other Topics Concern  . Not on file  Social History Narrative  . Not on file   Social  Determinants of Health   Financial Resource Strain: Not on file  Food Insecurity: Not on file  Transportation Needs: Not on file  Physical Activity: Not on file  Stress: Not on file  Social Connections: Not on file  Intimate Partner Violence: Not on file    Family History  Problem Relation Age of Onset  . Hypertension Maternal Grandfather     ROS: Cardiovascular: []  chest pain/pressure []  palpitations []  SOB lying flat []  DOE []  pain in legs while walking []  pain in legs at rest []  pain in legs at night []  non-healing ulcers []  hx of DVT [x]  swelling in legs  Pulmonary: []  productive cough []  asthma/wheezing []  home O2  Neurologic: []  weakness in []  arms []  legs []  numbness in []  arms []  legs []  hx of CVA []  mini stroke [] difficulty speaking or slurred speech []  temporary loss of vision in one eye []  dizziness  Hematologic: []  hx of cancer []  bleeding problems []  problems with blood clotting easily  Endocrine:   []  diabetes []  thyroid disease  GI [x]  abdominal pain []  blood in stool  GU: []  CKD/renal failure []  HD--[]  M/W/F or []  T/T/S []  burning with urination []  blood in urine  Psychiatric: []  anxiety []  depression  Musculoskeletal: []  arthritis []  joint pain  Integumentary: []  rashes []  ulcers  Constitutional: []  fever []  chills   Physical Examination  Vitals:   03/12/21 0444 03/12/21 0844  BP: 113/88 105/77  Pulse: 96 (!) 108  Resp: 18 18  Temp: 97.9 F (36.6 C) 98.6 F (37 C)  SpO2: 98% 91%   There is no height or weight on file to calculate BMI.  General: nad HENT: WNL Pulmonary: normal non-labored breathing Cardiac: bilateral femoral pulses are palpable Abdomen:  Mild ttp bilateral lower quadrants, no ttp upper quadrants, mild distension, no signs of peritonitis Skin: incisions left leg healing well Extremities: edema of left leg, left foot is warm and well perfused Neurologic: A&O X 3   CBC    Component Value  Date/Time   WBC 12.2 (H) 03/12/2021 0016   RBC 4.93 03/12/2021 0016   HGB 14.9 03/12/2021 0016   HGB 15.6 01/21/2021 1531   HCT 45.0 03/12/2021 0016   HCT 45.9 01/21/2021 1531   PLT 186 03/12/2021 0016   MCV 91.3 03/12/2021 0016   MCV 92 01/21/2021 1531   MCH 30.2 03/12/2021 0016   MCHC 33.1 03/12/2021 0016   RDW 13.1 03/12/2021 0016   RDW 14.8 01/21/2021 1531   LYMPHSABS 2.5 01/21/2021 1531   MONOABS 0.8 01/05/2021 2219   EOSABS 0.1 01/21/2021 1531   BASOSABS 0.1 01/21/2021 1531    BMET    Component Value Date/Time   NA 133 (L) 03/12/2021 0016   NA 142 01/21/2021 1531   K 4.1 03/12/2021 0016   CL 101 03/12/2021 0016   CO2 25 03/12/2021 0016   GLUCOSE 161 (H) 03/12/2021 0016   BUN 12 03/12/2021 0016  BUN 14 01/21/2021 1531   CREATININE 0.77 03/12/2021 0016   CALCIUM 9.5 03/12/2021 0016   GFRNONAA >60 03/12/2021 0016   GFRAA >60 02/23/2016 0418    COAGS: Lab Results  Component Value Date   INR 1.1 03/12/2021   INR 1.0 02/04/2021   INR 1.09 02/22/2016     Non-Invasive Vascular Imaging:    CT abdomen/pelvis  IMPRESSION: 1. 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 This may be sequela of prior pancreatitis. Clinical correlation is recommended. 2. No bowel obstruction. Normal appendix. 3. Aortic Atherosclerosis (ICD10-I70.0).    ASSESSMENT/PLAN: This is a 45 y.o. male with history of left femoral to peroneal artery bypass.  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  He is which is likely his chronic given the flush occlusion of the aorta and in reviewing recent angiograms likely celiac artery was filled retrograde at that time.  He does not have any peritoneal signs.  There are really no options for revascularization as the SMA IMA are patent.  Recommend anticoagulation, hydration, pain control with and diet advancement as tolerated.      Lynasia Meloche C.  Randie Heinz, MD [facility] Office: [phone] Pager: [phone]

## 2021-03-12 NOTE — Progress Notes (Signed)
Brief same-day note:  Patient is a 45 year old male with history of peripheral artery disease, coronary artery disease , NSTEMI status post stent who presented with 2 weeks history of abdominal pain .  On presentation he was hemodynamically stable.  He had mildly elevated leukocytes.  CT abdomen/pelvis showed complete thrombosis of the splenic artery and vein with splenic infarct suspicious to be from sequelae of prior pancreatitis.  Started on Lovenox.  Hypercoagulable panel sent,pending. vascular surgery consulted who recommended to continue anticoagulation, pain management/supportive care.  No plan for vascular intervention Patient seen and examined at the bedside this morning.  He was still complaining of abdominal pain more on the lower abdomen.  Patient was visibly irritated. Examination revealed mildly distended abdomen, tenderness on the left and lower abdomen.  CT abdomen/pelvis again reviewed it does not show any other explanation for lower abdominal pain most likely pain from the splenic infarct has radiated downwards. Also noted to have slightly reddish discoloration of the left toe but without obvious signs of infection.  He has a surgical scar on the left leg from recent left SFA to peroneal artery bypass.  We will continue current management with anticoagulation, pain medication.  He was also having some nausea for which Zofran has been ordered. We will follow-up with vascular surgery about transitioning the anticoagulation to oral, preferably Eliquis.  I also discussed with general surgery about his case regarding splenic infarction  ,general surgery  recommended to continue current management, no need for surgical intervention.  He might need vaccination against capsulated organisms before discharge because of splenic infarction.

## 2021-03-12 NOTE — Progress Notes (Signed)
Initial Nutrition Assessment  INTERVENTION:   -Boost Breeze po TID, each supplement provides 250 kcal and 9 grams of protein  -Once diet advanced: Ensure Enlive po BID, each supplement provides 350 kcal and 20 grams of protein  NUTRITION DIAGNOSIS:   Inadequate oral intake related to nausea,poor appetite as evidenced by percent weight loss.  GOAL:   Patient will meet greater than or equal to 90% of their needs  MONITOR:   PO intake,Supplement acceptance,Diet advancement,Labs,Weight trends,I & O's,Skin  REASON FOR ASSESSMENT:   Malnutrition Screening Tool    ASSESSMENT:   45 y.o. male with medical history significant for PAD, CAD, NSTEMI s/p stent who presents with two week history of abdominal pain.  He reports abdominal pain is been in the middle and lower part of his abdomen on the left side more than the right side.  Is been associated with nausea but no vomiting or diarrhea.  Patient in room with liquid tray at bedside. Pt reports having persistent nausea for 2 weeks. Symptoms began when he ate hot dogs at a gathering 2 weeks ago. Ever since his appetite has fluctuated and sometimes the sight of food causes nausea. Pt last consumed a BBQ sandwich on 5/8. Currently having a hard time taking in jello and Sprite.  Willing to try Boost Breeze while on clears then can order regular Ensure supplements.  Per weight records, pt weighed 156 lbs on 5/4. Current weight: 154 lbs. Pt states his UBW is 159-160 lbs.  Medications: Miralax, Senokot, Lactated ringers, Zofran  Labs reviewed: Low Na  NUTRITION - FOCUSED PHYSICAL EXAM:  No depletions noted.  Diet Order:   Diet Order            Diet clear liquid Room service appropriate? Yes; Fluid consistency: Thin  Diet effective now                 EDUCATION NEEDS:   Education needs have been addressed  Skin:  Skin Assessment: Reviewed RN Assessment  Last BM:  5/3  Height:   Ht Readings from Last 1 Encounters:   03/06/21 5\' 6"  (1.676 m)    Weight:   Wt Readings from Last 1 Encounters:  03/12/21 70.1 kg   BMI:  Body mass index is 24.94 kg/m.  Estimated Nutritional Needs:   Kcal:  1750-1950  Protein:  100-110g  Fluid:  1.8L/day  05/12/21, MS, RD, LDN Inpatient Clinical Dietitian Contact information available via Amion

## 2021-03-12 NOTE — ED Notes (Signed)
Family at bedside. 

## 2021-03-12 NOTE — ED Notes (Signed)
Advised Pt we need a urine specimen. Urinal at bedside. Evan Rodriguez, NT

## 2021-03-12 NOTE — ED Provider Notes (Signed)
Evan Rodriguez   CSN: [id number] Arrival date & time: 03/11/21  2359     History Chief Complaint  Patient presents with  . Abdominal Pain    Evan Rodriguez is a 45 y.o. male.  The history is provided by the patient and a relative.  Abdominal Pain Pain location:  Generalized Pain quality: aching   Pain radiates to:  Does not radiate Pain severity:  Severe Onset quality:  Gradual Duration:  2 weeks Timing:  Intermittent Progression:  Worsening Chronicity:  New Relieved by:  Nothing Worsened by:  Palpation and movement Associated symptoms: constipation and nausea   Associated symptoms: no chest pain, no diarrhea, no dysuria, no fever, no hematemesis, no hematochezia, no shortness of breath and no vomiting   Patient with history of CAD, peripheral vascular disease presents with abdominal pain.  Patient reports gradual onset of generalized abdominal pain for up to 2 weeks.  He also reports constipation.  He reports the pain has gotten worse over the past day Patient is a smoker and reports alcohol use    Past Medical History:  Diagnosis Date  . Coronary artery disease   . COVID-19   . NSTEMI (non-ST elevated myocardial infarction) (HCC) 02/22/2016   Stented  . Vaping nicotine dependence, non-tobacco product 01/22/2021    Patient Active Problem List   Diagnosis Date Noted  . PAD (peripheral artery disease) (HCC) 02/05/2021  . Critical lower limb ischemia (HCC)   . Elevated sed rate   . Tobacco dependence 01/22/2021  . Vaping nicotine dependence, non-tobacco product 01/22/2021  . Low BP 01/22/2021  . Hypokalemia 01/22/2021  . Skin ulcer of left foot including toes (HCC) 01/22/2021  . Maceration of skin 01/22/2021  . COVID toes 01/05/2021  . Tinea pedis 01/05/2021  . Dyslipidemia (high LDL; low HDL) 02/23/2016  . History of non-ST elevation myocardial infarction (NSTEMI) 02/22/2016    Past Surgical History:  Procedure  Laterality Date  . ABDOMINAL AORTOGRAM N/A 01/23/2021   Procedure: ABDOMINAL AORTOGRAM;  Surgeon: Leonie Douglas, MD;  Location[facility] INVASIVE CV LAB;  Service: Cardiovascular;  Laterality: N/A;  . BYPASS GRAFT FEMORAL-PERONEAL Left 02/05/2021   Procedure: LEFT SUPERFICIAL FEMORAL-PERONEAL ARTERY BYPASS GRAFT WITH SUBFACIAL, NON-REVERSED  VEIN.;  Surgeon: Chuck Hint, MD;  Location: [facility] OR;  Service: Vascular;  Laterality: Left;  . CARDIAC CATHETERIZATION N/A 02/22/2016   Procedure: Left Heart Cath and Coronary Angiography;  Surgeon: Tonny Bollman, MD;  Location: [facility] INVASIVE CV LAB;  Service: Cardiovascular;  Laterality: N/A;  . CARDIAC CATHETERIZATION N/A 02/22/2016   Procedure: Coronary Stent Intervention;  Surgeon: Tonny Bollman, MD;  Location: [facility] INVASIVE CV LAB;  Service: Cardiovascular;  Laterality: N/A;  . INTRAOPERATIVE ARTERIOGRAM Left 02/05/2021   Procedure: INTRA OPERATIVE ARTERIOGRAM- LEFT LOWER EXTREMITY;  Surgeon: Chuck Hint, MD;  Location: [facility] OR;  Service: Vascular;  Laterality: Left;  . LOWER EXTREMITY ANGIOGRAPHY Left 01/23/2021   Procedure: Lower Extremity Angiography;  Surgeon: Leonie Douglas, MD;  Location: [facility] INVASIVE CV LAB;  Service: Cardiovascular;  Laterality: Left;  . PERCUTANEOUS CORONARY STENT INTERVENTION (PCI-S)    . WRIST SURGERY         Family History  Problem Relation Age of Onset  . Hypertension Maternal Grandfather     Social History   Tobacco Use  . Smoking status: Current Every Day Smoker    Packs/day: 1.50    Years: 20.00    Pack years: 30.00    Types: Cigarettes  .  Smokeless tobacco: Former Neurosurgeon    Types: Chew    Quit date: 02/05/2019  . Tobacco comment: quit chew tobacco in 2020  Vaping Use  . Vaping Use: Some days  . Devices: [employer]; refillable.   Substance Use Topics  . Alcohol use: Yes    Comment: occasionally/monthly  . Drug use: No    Home Medications Prior to Admission medications   Medication Sig Start  Date End Date Taking? Authorizing Provider  aspirin 81 MG EC tablet TAKE 1 TABLET (81 MG TOTAL) BY MOUTH DAILY. 01/24/21 01/24/22 Yes Towanda Octave, MD  clotrimazole (LOTRIMIN) 1 % cream Apply topically 2 (two) times daily. 01/24/21  Yes Towanda Octave, MD  HYDROcodone-acetaminophen (NORCO/VICODIN) 5-325 MG tablet TAKE 1 TABLET BY MOUTH EVERY SIX HOURS AS NEEDED FOR UP TO 3 DAYS FOR MODERATE PAIN. 02/07/21  Yes Baglia, Corrina, PA-C  rosuvastatin (CRESTOR) 20 MG tablet Take 1 tablet (20 mg total) by mouth daily. 02/19/21  Yes Dollene Cleveland, DO    Allergies    Patient has no known allergies.  Review of Systems   Review of Systems  Constitutional: Negative for fever.  Respiratory: Negative for shortness of breath.   Cardiovascular: Negative for chest pain.  Gastrointestinal: Positive for abdominal pain, constipation and nausea. Negative for diarrhea, hematemesis, hematochezia and vomiting.  Genitourinary: Negative for dysuria.  Neurological: Positive for numbness.       Reports chronic left foot numbness  All other systems reviewed and are negative.   Physical Exam Updated Vital Signs BP (!) 116/94   Pulse (!) 107   Temp 98.5 F (36.9 C) (Oral)   Resp (!) 22   SpO2 92%   Physical Exam CONSTITUTIONAL: Uncomfortable appearing, appears older than stated age HEAD: Normocephalic/atraumatic EYES: EOMI/PERRL ENMT: Mucous membranes dry, poor dentition NECK: supple no meningeal signs SPINE/BACK:entire spine nontender CV: S1/S2 noted, no murmurs/rubs/gallops noted LUNGS: Lungs are clear to auscultation bilaterally, no apparent distress ABDOMEN: soft, diffuse moderate tenderness, no rebound or guarding, bowel sounds noted throughout abdomen GU:no cva tenderness, no inguinal hernia, no scrotal tenderness or edema, chaperone present Rectal-no stool impaction, stool is light in color, no blood or melena chaperone present NEURO: Pt is awake/alert/appropriate, moves all extremitiesx4.  No  facial droop.   EXTREMITIES: full ROM, chronic wound to left lower extremity.  Distal pulses found by Doppler in both feet SKIN: warm, color normal PSYCH: no abnormalities of mood noted, alert and oriented to situation  ED Results / Procedures / Treatments   Labs (all labs ordered are listed, but only abnormal results are displayed) Labs Reviewed  COMPREHENSIVE METABOLIC PANEL - Abnormal; Notable for the following components:      Result Value   Sodium 133 (*)    Glucose, Bld 161 (*)    Total Protein 8.6 (*)    All other components within normal limits  CBC - Abnormal; Notable for the following components:   WBC 12.2 (*)    All other components within normal limits  RESP PANEL BY RT-PCR (FLU A&B, COVID) ARPGX2  LIPASE, BLOOD  APTT  PROTIME-INR  URINALYSIS, ROUTINE W REFLEX MICROSCOPIC  POC OCCULT BLOOD, ED    EKG EKG Interpretation  Date/Time:  Tuesday Mar 12 2021 00:46:14 EDT Ventricular Rate:  108 PR Interval:  114 QRS Duration: 95 QT Interval:  347 QTC Calculation: 466 R Axis:   44 Text Interpretation: Sinus tachycardia Anterolateral infarct, age indeterminate Confirmed by Zadie Rhine ([id number]) on 03/12/2021 1:13:05 AM   Radiology CT  ABDOMEN PELVIS W CONTRAST  Result Date: 03/12/2021 CLINICAL DATA:  45 year old male with abdominal pain. EXAM: CT ABDOMEN AND PELVIS WITH CONTRAST TECHNIQUE: Multidetector CT imaging of the abdomen and pelvis was performed using the standard protocol following bolus administration of intravenous contrast. CONTRAST:  OMNIPAQUE IOHEXOL 300 MG/ML  SOLN COMPARISON:  None. FINDINGS: Lower chest: Left lung base linear atelectasis/scarring. The visualized lung bases are otherwise clear. No intra-abdominal free air or free fluid. Hepatobiliary: No focal liver abnormality is seen. No gallstones, gallbladder wall thickening, or biliary dilatation. Pancreas: The pancreas is unremarkable. Spleen: There is diffuse hypoenhancement of the spleen  consistent with splenic infarct. Adrenals/Urinary Tract: The adrenal glands are unremarkable. There is no hydronephrosis on either side. There is symmetric enhancement and excretion of contrast by both kidneys. The visualized ureters and urinary bladder appear unremarkable. Stomach/Bowel: There is no bowel obstruction or active inflammation. The appendix is normal. Vascular/Lymphatic: Mild calcified and noncalcified plaque of the distal abdominal aorta and iliac arteries. The IVC is unremarkable. There is thrombosis of the splenic vein from the splenic hilum into the porta splenic confluence. There is dilatation of the vein suggestive of acute thrombus. The SMV and main portal vein are patent. No portal venous gas. The splenic artery also appears thrombosed. Findings may be sequela of prior pancreatitis. Clinical correlation is recommended. Reproductive: The prostate and seminal vesicles are grossly unremarkable. Other: None Musculoskeletal: No acute or significant osseous findings. IMPRESSION: 1. 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 This may be sequela of prior pancreatitis. Clinical correlation is recommended. 2. No bowel obstruction. Normal appendix. 3. Aortic Atherosclerosis (ICD10-I70.0). Electronically Signed   By: Elgie Collard M.D.   On: 03/12/2021 02:09    Procedures Procedures   Medications Ordered in ED Medications  fentaNYL (SUBLIMAZE) injection 100 mcg (100 mcg Intravenous Given 03/12/21 0132)  ondansetron (ZOFRAN) injection 4 mg (4 mg Intravenous Given 03/12/21 0132)  sodium chloride 0.9 % bolus 1,000 mL (1,000 mLs Intravenous New Bag/Given 03/12/21 0137)  iohexol (OMNIPAQUE) 300 MG/ML solution 100 mL (100 mLs Intravenous Contrast Given 03/12/21 0150)    ED Course  I have reviewed the triage vital signs and the nursing notes.  Pertinent labs & imaging results that were available during my care of the patient were reviewed by me and considered in my  medical decision making (see chart for details).    MDM Rules/Calculators/A&P                          2:23 AM Patient presents with abdominal pain for 2 weeks.  Patient has multiple chronic illnesses including peripheral arterial disease required left superficial femoral-peroneal bypass 2:38 AM CT findings reveal splenic vein thrombosis and splenic infarct Discussed findings with Dr. Randie Heinz with vascular surgery. Would recommend medical evaluation and may need anticoagulation.  Will consult hospitalist 3:35 AM Patient is managed primarily by family medicine, but since he is at [facility] he will be managed by the hospitalist.  Discussed with Dr. Rachael Darby for admission. He will order coagulation panel, then patient may need anticoagulation after that.  Patient resting comfortably at this time does not have any further pain medicine requests Final Clinical Impression(s) / ED Diagnoses Final diagnoses:  Splenic infarct  Splenic vein thrombosis    Rx / DC Orders ED Discharge Orders    None       Zadie Rhine, MD 03/12/21 [phone]

## 2021-03-13 LAB — CBC
HCT: 39.1 % (ref 39.0–52.0)
Hemoglobin: 12.7 g/dL — ABNORMAL LOW (ref 13.0–17.0)
MCH: 29.8 pg (ref 26.0–34.0)
MCHC: 32.5 g/dL (ref 30.0–36.0)
MCV: 91.8 fL (ref 80.0–100.0)
Platelets: 186 10*3/uL (ref 150–400)
RBC: 4.26 MIL/uL (ref 4.22–5.81)
RDW: 13.4 % (ref 11.5–15.5)
WBC: 11.9 10*3/uL — ABNORMAL HIGH (ref 4.0–10.5)
nRBC: 0 % (ref 0.0–0.2)

## 2021-03-13 LAB — BASIC METABOLIC PANEL
Anion gap: 9 (ref 5–15)
BUN: 9 mg/dL (ref 6–20)
CO2: 22 mmol/L (ref 22–32)
Calcium: 8.3 mg/dL — ABNORMAL LOW (ref 8.9–10.3)
Chloride: 100 mmol/L (ref 98–111)
Creatinine, Ser: 0.82 mg/dL (ref 0.61–1.24)
GFR, Estimated: >60 mL/min (ref >60)
Glucose, Bld: 91 mg/dL (ref 70–99)
Potassium: 3.8 mmol/L (ref 3.5–5.1)
Sodium: 131 mmol/L — ABNORMAL LOW (ref 135–145)

## 2021-03-13 LAB — PROTEIN C, TOTAL: Protein C, Total: 77 % (ref 60–150)

## 2021-03-13 LAB — PROTEIN C ACTIVITY: Protein C Activity: 87 % (ref 73–180)

## 2021-03-13 LAB — PROTEIN S ACTIVITY: Protein S Activity: 85 % (ref 63–140)

## 2021-03-13 LAB — PROTEIN S, TOTAL: Protein S Ag, Total: 136 % (ref 60–150)

## 2021-03-13 MED ORDER — POTASSIUM CHLORIDE IN NACL 20-0.9 MEQ/L-% IV SOLN
INTRAVENOUS | Status: DC
  Filled 2021-03-13 (×5): qty 1000

## 2021-03-13 MED ORDER — SENNOSIDES-DOCUSATE SODIUM 8.6-50 MG PO TABS
1.0000 | ORAL_TABLET | Freq: Two times a day (BID) | ORAL | Status: DC
  Administered 2021-03-13 – 2021-03-14 (×2): 1 via ORAL
  Filled 2021-03-13: qty 1

## 2021-03-13 MED ORDER — POLYETHYLENE GLYCOL 3350 17 G PO PACK: 17.0000 g | PACK | Freq: Every day | ORAL | Status: DC

## 2021-03-13 NOTE — Progress Notes (Signed)
Patient is complaining of extreme nausea. He is noted to be actively vomiting x1. Provider was notified. Continuing to monitor patient, will start on PRN nausea medication.

## 2021-03-13 NOTE — Progress Notes (Signed)
[facility] PROGRESS NOTE    Glendora ScoreMichael Niswander  WUJ:[id number]:[id number] DOB: [birth year] DOA: 03/12/2021 PCP: Dollene ClevelandAnderson, Hannah C, DO      Brief Narrative:  Mr. Jordan LikesSpivey is a 45 y.o. M with hx COVID 1 year ago, more recnet CAD and PVD s/p SFA to peroneal bypass by Dr. Edilia Boickson, still smoking who presented with post-prandial vomiting, anorexia and then lower abdominal pain.  In the ER, CT imaging showed an occluded celiac, as well as splenic vein thrombosis.        Assessment & Plan:  Celiac artery occlusion Splenic vein thrombosis Vascular surgery were consulted, their review of imaging suggest that this is likely somewhat chronic, and the celiac is occluded, so no intervention is planned at this time - Continue Lovenox for now - Advance diet as tolerated  Peripheral vascular disease and recent bypass -Continue aspirin an dstatin    Smoking -Continue nicotine  Hyponatremia Mild, asymptomatic        Disposition: Status is: Inpatient  Remains inpatient appropriate because:IV treatments appropriate due to intensity of illness or inability to take PO   Dispo: The patient is from: Home              Anticipated d/c is to: Home              Patient currently is not medically stable to d/c.   Difficult to place patient No       Level of care: Med-Surg       MDM: The below labs and imaging reports were reviewed and summarized above.  Medication management as above.    DVT prophylaxis: N/A on Lovenox therapeutic dose  Code Status: FULL Family Communication: None present          Subjective: Vomited everything after trying solid food today.  No fever, no respiratory distress.  He still has some lower abdominal discomfort.  No confusion.  Objective: Vitals:   03/12/21 2122 03/13/21 0529 03/13/21 0604 03/13/21 1322  BP: 108/86 105/82  103/71  Pulse: (!) 110 (!) 110  (!) 109  Resp: 18 18  18   Temp: 100 F (37.8 C) 99.5 F (37.5 C)  99.2 F  (37.3 C)  TempSrc: Oral Oral  Oral  SpO2: 93% (!) 89% 92% 91%  Weight:        Intake/Output Summary (Last 24 hours) at 03/13/2021 1725 Last data filed at 03/13/2021 1500 Gross per 24 hour  Intake 1572.36 ml  Output 700 ml  Net 872.36 ml   Filed Weights   03/12/21 0916  Weight: 70.1 kg    Examination: General appearance:  adult male, alert and in no distress.   HEENT: Anicteric, conjunctiva pink, lids and lashes normal. No nasal deformity, discharge, epistaxis.  Lips moist.   Skin: Warm and dry.  No jaundice.  No suspicious rashes or lesions. Cardiac: RRR, nl S1-S2, no murmurs appreciated.  Capillary refill is brisk.  JVP normal.  No LE edema.  Radial pulses 2+ and symmetric. Respiratory: Normal respiratory rate and rhythm.  CTAB without rales or wheezes. Abdomen: Abdomen soft.  Mild nonfocal TTP or guarding. No ascites, distension, hepatosplenomegaly.   MSK: No deformities or effusions.  Left bypass incision clean dry and intact Neuro: Awake and alert.  EOMI, moves all extremities. Speech fluent.    Psych: Sensorium intact and responding to questions, attention normal. Affect normal.  Judgment and insight appear normal.    Data Reviewed: I have personally reviewed following labs and imaging studies:  CBC: Recent Labs  Lab 03/12/21 0016 03/13/21 0514  WBC 12.2* 11.9*  HGB 14.9 12.7*  HCT 45.0 39.1  MCV 91.3 91.8  PLT 186 186   Basic Metabolic Panel: Recent Labs  Lab 03/12/21 0016 03/13/21 0514  NA 133* 131*  K 4.1 3.8  CL 101 100  CO2 25 22  GLUCOSE 161* 91  BUN 12 9  CREATININE 0.77 0.82  CALCIUM 9.5 8.3*   GFR: Estimated Creatinine Clearance: 101.6 mL/min (by C-G formula based on SCr of 0.82 mg/dL). Liver Function Tests: Recent Labs  Lab 03/12/21 0016  AST 16  ALT 20  ALKPHOS 91  BILITOT 0.5  PROT 8.6*  ALBUMIN 3.7   Recent Labs  Lab 03/12/21 0016  LIPASE 30   No results for input(s): AMMONIA in the last 168 hours. Coagulation  Profile: Recent Labs  Lab 03/12/21 0016  INR 1.1   Cardiac Enzymes: No results for input(s): CKTOTAL, CKMB, CKMBINDEX, TROPONINI in the last 168 hours. BNP (last 3 results) No results for input(s): PROBNP in the last 8760 hours. HbA1C: No results for input(s): HGBA1C in the last 72 hours. CBG: No results for input(s): GLUCAP in the last 168 hours. Lipid Profile: No results for input(s): CHOL, HDL, LDLCALC, TRIG, CHOLHDL, LDLDIRECT in the last 72 hours. Thyroid Function Tests: No results for input(s): TSH, T4TOTAL, FREET4, T3FREE, THYROIDAB in the last 72 hours. Anemia Panel: No results for input(s): VITAMINB12, FOLATE, FERRITIN, TIBC, IRON, RETICCTPCT in the last 72 hours. Urine analysis:    Component Value Date/Time   COLORURINE YELLOW 02/04/2021 1129   APPEARANCEUR HAZY (A) 02/04/2021 1129   LABSPEC 1.020 02/04/2021 1129   PHURINE 6.0 02/04/2021 1129   GLUCOSEU NEGATIVE 02/04/2021 1129   HGBUR NEGATIVE 02/04/2021 1129   BILIRUBINUR NEGATIVE 02/04/2021 1129   KETONESUR NEGATIVE 02/04/2021 1129   PROTEINUR NEGATIVE 02/04/2021 1129   NITRITE NEGATIVE 02/04/2021 1129   LEUKOCYTESUR NEGATIVE 02/04/2021 1129   Sepsis Labs: @LABRCNTIP (procalcitonin:4,lacticacidven:4)  ) Recent Results (from the past 240 hour(s))  Resp Panel by RT-PCR (Flu A&B, Covid) Nasopharyngeal Swab     Status: None   Collection Time: 03/12/21  1:32 AM   Specimen: Nasopharyngeal Swab; Nasopharyngeal(NP) swabs in vial transport medium  Result Value Ref Range Status   SARS Coronavirus 2 by RT PCR NEGATIVE NEGATIVE Final    Comment: (NOTE) SARS-CoV-2 target nucleic acids are NOT DETECTED.  The SARS-CoV-2 RNA is generally detectable in upper respiratory specimens during the acute phase of infection. The lowest concentration of SARS-CoV-2 viral copies this assay can detect is 138 copies/mL. A negative result does not preclude SARS-Cov-2 infection and should not be used as the sole basis for treatment  or other patient management decisions. A negative result may occur with  improper specimen collection/handling, submission of specimen other than nasopharyngeal swab, presence of viral mutation(s) within the areas targeted by this assay, and inadequate number of viral copies(<138 copies/mL). A negative result must be combined with clinical observations, patient history, and epidemiological information. The expected result is Negative.  Fact Sheet for Patients:  05/12/21  Fact Sheet for Healthcare Providers:  BloggerCourse.com  This test is no t yet approved or cleared by the [place] FDA and  has been authorized for detection and/or diagnosis of SARS-CoV-2 by FDA under an Emergency Use Authorization (EUA). This EUA will remain  in effect (meaning this test can be used) for the duration of the COVID-19 declaration under Section 564(b)(1) of the Act, 21 U.S.C.section 360bbb-3(b)(1), unless the  authorization is terminated  or revoked sooner.       Influenza A by PCR NEGATIVE NEGATIVE Final   Influenza B by PCR NEGATIVE NEGATIVE Final    Comment: (NOTE) The Xpert Xpress SARS-CoV-2/FLU/RSV plus assay is intended as an aid in the diagnosis of influenza from Nasopharyngeal swab specimens and should not be used as a sole basis for treatment. Nasal washings and aspirates are unacceptable for Xpert Xpress SARS-CoV-2/FLU/RSV testing.  Fact Sheet for Patients: BloggerCourse.com  Fact Sheet for Healthcare Providers: SeriousBroker.it  This test is not yet approved or cleared by the [place] FDA and has been authorized for detection and/or diagnosis of SARS-CoV-2 by FDA under an Emergency Use Authorization (EUA). This EUA will remain in effect (meaning this test can be used) for the duration of the COVID-19 declaration under Section 564(b)(1) of the Act, 21 U.S.C. section  360bbb-3(b)(1), unless the authorization is terminated or revoked.  Performed at [facility], [address] [place], [place] [address]          Radiology Studies: CT ABDOMEN PELVIS W CONTRAST  Result Date: 03/12/2021 CLINICAL DATA:  45 year old male with abdominal pain. EXAM: CT ABDOMEN AND PELVIS WITH CONTRAST TECHNIQUE: Multidetector CT imaging of the abdomen and pelvis was performed using the standard protocol following bolus administration of intravenous contrast. CONTRAST:  OMNIPAQUE IOHEXOL 300 MG/ML  SOLN COMPARISON:  None. FINDINGS: Lower chest: Left lung base linear atelectasis/scarring. The visualized lung bases are otherwise clear. No intra-abdominal free air or free fluid. Hepatobiliary: No focal liver abnormality is seen. No gallstones, gallbladder wall thickening, or biliary dilatation. Pancreas: The pancreas is unremarkable. Spleen: There is diffuse hypoenhancement of the spleen consistent with splenic infarct. Adrenals/Urinary Tract: The adrenal glands are unremarkable. There is no hydronephrosis on either side. There is symmetric enhancement and excretion of contrast by both kidneys. The visualized ureters and urinary bladder appear unremarkable. Stomach/Bowel: There is no bowel obstruction or active inflammation. The appendix is normal. Vascular/Lymphatic: Mild calcified and noncalcified plaque of the distal abdominal aorta and iliac arteries. The IVC is unremarkable. There is thrombosis of the splenic vein from the splenic hilum into the porta splenic confluence. There is dilatation of the vein suggestive of acute thrombus. The SMV and main portal vein are patent. No portal venous gas. The splenic artery also appears thrombosed. Findings may be sequela of prior pancreatitis. Clinical correlation is recommended. Reproductive: The prostate and seminal vesicles are grossly unremarkable. Other: None Musculoskeletal: No acute or significant osseous findings.  IMPRESSION: 1. 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 This may be sequela of prior pancreatitis. Clinical correlation is recommended. 2. No bowel obstruction. Normal appendix. 3. Aortic Atherosclerosis (ICD10-I70.0). Electronically Signed   By: Elgie Collard M.D.   On: 03/12/2021 02:09        Scheduled Meds: . aspirin EC  81 mg Oral Daily  . enoxaparin (LOVENOX) injection  1 mg/kg Subcutaneous Q12H  . feeding supplement  1 Container Oral TID BM  . nicotine  14 mg Transdermal Daily  . polyethylene glycol  17 g Oral Daily  . rosuvastatin  20 mg Oral Daily  . senna-docusate  1 tablet Oral BID  . senna-docusate  1 tablet Oral BID   Continuous Infusions: . 0.9 % NaCl with KCl 20 mEq / L 100 mL/hr at 03/13/21 1004  . promethazine (PHENERGAN) injection (IM or IVPB) 12.5 mg (03/13/21 1030)     LOS: 1 day    Time  spent: 35 minutes    Alberteen Sam, MD [facility] 03/13/2021, 5:25 PM     Please page though AMION or Epic secure chat:  For [employer], Higher education careers adviser

## 2021-03-14 ENCOUNTER — Inpatient Hospital Stay (HOSPITAL_COMMUNITY): Payer: 59

## 2021-03-14 LAB — ANTIPHOSPHOLIPID SYNDROME EVAL, BLD
Anticardiolipin IgA: <9 APL U/mL (ref 0–11)
Anticardiolipin IgG: <9 GPL U/mL (ref 0–14)
Anticardiolipin IgM: <9 MPL U/mL (ref 0–12)
DRVVT: 57.8 s — ABNORMAL HIGH (ref 0.0–47.0)
PTT Lupus Anticoagulant: 46.2 s (ref 0.0–51.9)
Phosphatydalserine, IgA: <1 APS Units (ref 0–19)
Phosphatydalserine, IgG: 9 Units (ref 0–30)
Phosphatydalserine, IgM: 12 Units (ref 0–30)

## 2021-03-14 LAB — LACTIC ACID, PLASMA: Lactic Acid, Venous: 1.3 mmol/L (ref 0.5–1.9)

## 2021-03-14 LAB — DRVVT MIX: dRVVT Mix: 43.6 s — ABNORMAL HIGH (ref 0.0–40.4)

## 2021-03-14 LAB — DRVVT CONFIRM: dRVVT Confirm: 1.2 ratio (ref 0.8–1.2)

## 2021-03-14 LAB — LIPASE, BLOOD: Lipase: 32 U/L (ref 11–51)

## 2021-03-14 MED ORDER — PROMETHAZINE HCL 25 MG PO TABS: 25.0000 mg | ORAL_TABLET | Freq: Four times a day (QID) | ORAL | Status: DC | PRN

## 2021-03-14 MED ORDER — APIXABAN 5 MG PO TABS
10.0000 mg | ORAL_TABLET | Freq: Two times a day (BID) | ORAL | Status: DC
  Administered 2021-03-14 – 2021-03-15 (×2): 10 mg via ORAL
  Filled 2021-03-14 (×2): qty 2

## 2021-03-14 MED ORDER — APIXABAN 5 MG PO TABS: 5.0000 mg | ORAL_TABLET | Freq: Two times a day (BID) | ORAL | Status: DC

## 2021-03-14 NOTE — Progress Notes (Signed)
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  Will dose Eliquis 10 mg PO bid x 7 days, followed by 5 mg PO bid thereafter  Further renal adjustments appear unlikely, therefore Pharmacy will sign off, following peripherally clinical changes.  Bernadene Person, PharmD, BCPS [phone] 03/14/2021, 3:26 PM

## 2021-03-14 NOTE — Progress Notes (Signed)
[facility] PROGRESS NOTE    [facility]  OVF:[id number] DOB: June 25, 1976 DOA: 03/12/2021 PCP: Dollene Cleveland, DO      Brief Narrative:  Evan [facility] is a 45 y.o. M with hx COVID 1 year ago, more recnet CAD and PVD s/p SFA to peroneal bypass by Dr. Edilia Bo, still smoking who presented with post-prandial vomiting, anorexia and then lower abdominal pain.  In the ER, CT imaging showed an occluded celiac, as well as splenic vein thrombosis.        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  This morning tolerated breakfast with pre-treatment with ondansetron. - Transition Lovenox to Eliquis - Advance diet as tolerated, pretreat with antiemetic -Stop IV fluids    Peripheral vascular disease and recent bypass -Continue aspirin and statin    Smoking Cessation recommended, modalities discussed  -Continue nicotine supplement  Hyponatremia Mild, asymptomatic        Disposition: Status is: Inpatient  Remains inpatient appropriate because:IV treatments appropriate due to intensity of illness or inability to take PO   Dispo: The patient is from: Home              Anticipated d/c is to: Home              Patient currently is not medically stable to d/c.   Difficult to place patient No       Level of care: Med-Surg       MDM: The below labs and imaging reports were reviewed and summarized above.  Medication management as above.    DVT prophylaxis: N/A on therapeutic anticoagulation  Code Status: FULL Family Communication: Called the daughter yesterday, no answer          Subjective: Vomited yesterday afternoon, overnight no fever, respiratory distress.  This morning able to  tolerate breakfast after pretreatment with ondansetron.  No chest discomfort, dyspnea, confusion.  He has some mild lower EXTR abdominal pain, worse with movement, no postprandial pain.     Objective: Vitals:   03/13/21 1322 03/13/21 2123 03/14/21 0613 03/14/21 1422  BP: 103/71 113/63 101/73 100/64  Pulse: (!) 109 98 97 97  Resp: 18 17 15 18   Temp: 99.2 F (37.3 C) 99.1 F (37.3 C) 98.9 F (37.2 C) 98.2 F (36.8 C)  TempSrc: Oral Oral Oral Oral  SpO2: 91% 95% 94% 97%  Weight:        Intake/Output Summary (Last 24 hours) at 03/14/2021 1433 Last data filed at 03/14/2021 0600 Gross per 24 hour  Intake 1118.04 ml  Output 900 ml  Net 218.04 ml   Filed Weights   03/12/21 0916  Weight: 70.1 kg    Examination: General appearance: Adult male, lying in bed, no acute distress     HEENT:    Skin: No suspicious rashes or lesions, no jaundice Cardiac: Regular rate and rhythm, no murmurs, no lower extremity edema Respiratory: Normal respiratory rate and rhythm, lungs clear without rales or wheezes Abdomen: Abdomen soft, mild lower quadrant abdominal pain, tenderness palpation, but no guarding, no rigidity, no rebound, no ascites, no distention. MSK:  Neuro: Awake and alert, moves all extremities with normal strength and coordination, speech fluent Psych: Sensorium intact responding to questions, attention normal, affect normal, judgment insight appear normal  Data Reviewed: I have personally reviewed following labs and imaging studies:  CBC: Recent Labs  Lab 03/12/21 0016 03/13/21 0514  WBC 12.2* 11.9*  HGB 14.9 12.7*  HCT 45.0 39.1  MCV 91.3 91.8  PLT 186 186   Basic Metabolic Panel: Recent Labs  Lab 03/12/21 0016 03/13/21 0514  NA 133* 131*  K 4.1 3.8  CL 101 100  CO2 25 22  GLUCOSE 161* 91  BUN 12 9  CREATININE 0.77 0.82  CALCIUM 9.5 8.3*   GFR: Estimated Creatinine Clearance: 101.6 mL/min (by C-G formula based on SCr of 0.82  mg/dL). Liver Function Tests: Recent Labs  Lab 03/12/21 0016  AST 16  ALT 20  ALKPHOS 91  BILITOT 0.5  PROT 8.6*  ALBUMIN 3.7   Recent Labs  Lab 03/12/21 0016  LIPASE 30   No results for input(s): AMMONIA in the last 168 hours. Coagulation Profile: Recent Labs  Lab 03/12/21 0016  INR 1.1   Cardiac Enzymes: No results for input(s): CKTOTAL, CKMB, CKMBINDEX, TROPONINI in the last 168 hours. BNP (last 3 results) No results for input(s): PROBNP in the last 8760 hours. HbA1C: No results for input(s): HGBA1C in the last 72 hours. CBG: No results for input(s): GLUCAP in the last 168 hours. Lipid Profile: No results for input(s): CHOL, HDL, LDLCALC, TRIG, CHOLHDL, LDLDIRECT in the last 72 hours. Thyroid Function Tests: No results for input(s): TSH, T4TOTAL, FREET4, T3FREE, THYROIDAB in the last 72 hours. Anemia Panel: No results for input(s): VITAMINB12, FOLATE, FERRITIN, TIBC, IRON, RETICCTPCT in the last 72 hours. Urine analysis:    Component Value Date/Time   COLORURINE YELLOW 02/04/2021 1129   APPEARANCEUR HAZY (A) 02/04/2021 1129   LABSPEC 1.020 02/04/2021 1129   PHURINE 6.0 02/04/2021 1129   GLUCOSEU NEGATIVE 02/04/2021 1129   HGBUR NEGATIVE 02/04/2021 1129   BILIRUBINUR NEGATIVE 02/04/2021 1129   KETONESUR NEGATIVE 02/04/2021 1129   PROTEINUR NEGATIVE 02/04/2021 1129   NITRITE NEGATIVE 02/04/2021 1129   LEUKOCYTESUR NEGATIVE 02/04/2021 1129   Sepsis Labs: @LABRCNTIP (procalcitonin:4,lacticacidven:4)  ) Recent Results (from the past 240 hour(s))  Resp Panel by RT-PCR (Flu A&B, Covid) Nasopharyngeal Swab     Status: None   Collection Time: 03/12/21  1:32 AM   Specimen: Nasopharyngeal Swab; Nasopharyngeal(NP) swabs in vial transport medium  Result Value Ref Range Status   SARS Coronavirus 2 by RT PCR NEGATIVE NEGATIVE Final    Comment: (NOTE) SARS-CoV-2 target nucleic acids are NOT DETECTED.  The SARS-CoV-2 RNA is generally detectable in upper  respiratory specimens during the acute phase of infection. The lowest concentration of SARS-CoV-2 viral copies this assay can detect is 138 copies/mL. A negative result does not preclude SARS-Cov-2 infection and should not be used as the sole basis for treatment or other patient management decisions. A negative result may occur with  improper specimen collection/handling, submission of specimen other than nasopharyngeal swab, presence of viral mutation(s) within the areas targeted by this assay, and inadequate number of viral copies(<138 copies/mL). A negative result must be combined with clinical observations, patient history, and epidemiological information. The expected result is Negative.  Fact Sheet for Patients:  05/12/21  Fact Sheet for Healthcare Providers:  BloggerCourse.com  This test is no t yet approved or cleared by the [employer] FDA and  has been authorized for detection and/or diagnosis of SARS-CoV-2 by FDA under an Emergency Use Authorization (EUA). This EUA will remain  in effect (meaning this test can be used) for the duration of the COVID-19  declaration under Section 564(b)(1) of the Act, 21 U.S.C.section 360bbb-3(b)(1), unless the authorization is terminated  or revoked sooner.       Influenza A by PCR NEGATIVE NEGATIVE Final   Influenza B by PCR NEGATIVE NEGATIVE Final    Comment: (NOTE) The Xpert Xpress SARS-CoV-2/FLU/RSV plus assay is intended as an aid in the diagnosis of influenza from Nasopharyngeal swab specimens and should not be used as a sole basis for treatment. Nasal washings and aspirates are unacceptable for Xpert Xpress SARS-CoV-2/FLU/RSV testing.  Fact Sheet for Patients: BloggerCourse.com  Fact Sheet for Healthcare Providers: [employer]ious[employer]ker[employer]it  This test is not yet approved or cleared by the [place] FDA and has been  authorized for detection and/or diagnosis of SARS-CoV-2 by FDA under an Emergency Use Authorization (EUA). This EUA will remain in effect (meaning this test can be used) for the duration of the COVID-19 declaration under Section 564(b)(1) of the Act, 21 U.S.C. section 360bbb-3(b)(1), unless the authorization is terminated or revoked.  Performed at [facility], [address], [place], [place] [address]          Radiology Studies: No results found.      Scheduled Meds: . aspirin EC  81 mg Oral Daily  . enoxaparin (LOVENOX) injection  1 mg/kg Subcutaneous Q12H  . feeding supplement  1 Container Oral TID BM  . nicotine  14 mg Transdermal Daily  . polyethylene glycol  17 g Oral Daily  . rosuvastatin  20 mg Oral Daily  . senna-docusate  1 tablet Oral BID   Continuous Infusions: . 0.9 % NaCl with KCl 20 mEq / L 100 mL/hr at 03/14/21 0551  . promethazine (PHENERGAN) injection (IM or IVPB) 12.5 mg (03/14/21 0559)     LOS: 2 days    Time spent: 35 minutes    Alberteen Sam, MD [facility] 03/14/2021, 2:33 PM     Please page though AMION or Epic secure chat:  For [employer], Higher education careers adviser

## 2021-03-15 ENCOUNTER — Other Ambulatory Visit (HOSPITAL_COMMUNITY): Payer: Self-pay

## 2021-03-15 MED ORDER — APIXABAN 5 MG PO TABS
10.0000 mg | ORAL_TABLET | Freq: Two times a day (BID) | ORAL | 0 refills | Status: DC
  Filled 2021-03-15 (×2): qty 20, 5d supply, fill #0

## 2021-03-15 MED ORDER — APIXABAN 5 MG PO TABS
5.0000 mg | ORAL_TABLET | Freq: Two times a day (BID) | ORAL | 2 refills | Status: DC
  Filled 2021-03-15: qty 60, 30d supply, fill #0

## 2021-03-15 MED ORDER — NICOTINE 14 MG/24HR TD PT24
14.0000 mg | MEDICATED_PATCH | Freq: Every day | TRANSDERMAL | 0 refills | Status: DC
  Filled 2021-03-15: qty 28, 28d supply, fill #0

## 2021-03-15 MED ORDER — OXYCODONE-ACETAMINOPHEN 7.5-325 MG PO TABS
1.0000 | ORAL_TABLET | Freq: Four times a day (QID) | ORAL | 0 refills | Status: DC | PRN
  Filled 2021-03-15: qty 12, 3d supply, fill #0

## 2021-03-15 MED ORDER — ROSUVASTATIN CALCIUM 20 MG PO TABS
20.0000 mg | ORAL_TABLET | Freq: Every day | ORAL | 3 refills | Status: DC
  Filled 2021-03-15: qty 30, 30d supply, fill #0

## 2021-03-15 MED ORDER — PROMETHAZINE HCL 25 MG PO TABS
25.0000 mg | ORAL_TABLET | Freq: Four times a day (QID) | ORAL | 1 refills | Status: DC | PRN
  Filled 2021-03-15: qty 60, 15d supply, fill #0

## 2021-03-15 MED ORDER — SENNOSIDES-DOCUSATE SODIUM 8.6-50 MG PO TABS: 1.0000 | ORAL_TABLET | Freq: Two times a day (BID) | ORAL | Status: DC

## 2021-03-15 NOTE — Plan of Care (Signed)
  Problem: Education: Goal: Knowledge of General Education information will improve Description: Including pain rating scale, medication(s)/side effects and non-pharmacologic comfort measures Outcome: Adequate for Discharge   Problem: Health Behavior/Discharge Planning: Goal: Ability to manage health-related needs will improve Outcome: Adequate for Discharge   Problem: Activity: Goal: Risk for activity intolerance will decrease Outcome: Adequate for Discharge   Problem: Nutrition: Goal: Adequate nutrition will be maintained Outcome: Adequate for Discharge   Problem: Pain Managment: Goal: General experience of comfort will improve Outcome: Adequate for Discharge   Problem: Safety: Goal: Ability to remain free from injury will improve Outcome: Adequate for Discharge

## 2021-03-15 NOTE — Discharge Summary (Signed)
Physician Discharge Summary  Evan Rodriguez WUJ:[id number] DOB: 05-28-1976 DOA: 03/12/2021  PCP: Dollene Cleveland, DO  Admit date: 03/12/2021 Discharge date: 03/15/2021  Admitted From: Home  Disposition:  Home   Recommendations for Outpatient Follow-up:  1. Follow up with PCP Dr. Dareen Piano in 1-2 weeks 2. Follow up with Vascular Surgery in 2 weeks 3. Dr. Dareen Piano: Please obtain BMP in 1 week 4. Dr. Dareen Piano: Please offer Chantix/Wellbutrin 5. Dr. Dareen Piano: Please provide PCV13/PPSV23, Hib and Menactra given asplenia     Home Health: None  Equipment/Devices: None  Discharge Condition: Good  CODE STATUS: FULL Diet recommendation: Cardiac  Brief/Interim Summary: Evan Rodriguez is a 45 y.o. M with hx COVID 1 year ago, CAD and PVD s/p SFA to peroneal bypass by Dr. Edilia Bo, still smoking who presented with progressive post-prandial vomiting, anorexia over weeks and now few days lower abdominal pain.  In the ER, CT imaging showed an occluded splenic artery, diffuse splenic infarct, as well as splenic vein thrombosis.      PRINCIPAL HOSPITAL DIAGNOSIS: Celiac artery and splenic vein thrombosis    Discharge Diagnoses:   Splenic artery occlusion Splenic vein thrombosis Vascular surgery were consulted for splenic artery occlusion.  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  They felt no intervention was possible.  Recommended anticoagulation and outpatient follow up.  Diet was advanced, patient tolerated solid diet for 24 hours, and was discharged with follow up on new Eliquis.      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  Discussed with General Surgery, this may cause pain for a short time, but will generally involute and is not routinely an indication for splenectomy.    -Needs vaccination for encapsulated organisms    Peripheral  vascular disease and recent bypass On aspirin and statin.    Smoking Cessation recommended, modalities discussed   Hyponatremia              Discharge Instructions  Discharge Instructions    Diet - low sodium heart healthy   Complete by: As directed    Discharge instructions   Complete by: As directed    From Dr. Maryfrances Bunnell: You were admitted because you were found to have a splenic vein clot and also blockage of your celiac artery.  The splenic vein is the vein that drains your spleen.  Clot there must be treated with a blood thinner.   The blood thinner is apixaban/Eliquis Take Eliquis 10 mg twice daily (two tabs in morning and two tabs at night) for a week, then on 5/19, reduce to 5 mg twice daily (one tab in morning and one at night)  Dr. Randie Heinz and Dr. Edilia Bo the vascular surgeons, will help you manage this.  Go see them on the 25th at your scheudled appointment, and ask at that appointment how long to keep taking it   For the nausea, take promethazine/Phenergan 25 mg tab an hour before eating as needed  If you start to develop severe pain after eating or develop severe abdominal pain that is worse than anything before, go back to the ER   Keep taking your aspirin and Crestor (cholesterol medicine) One rare complication of blood thinners is rectal bleeding. IF you notice black and sticky bowel movements or bloody bowel movements, call your primary care doctor immedaitely        For pain, take oxycodone 7.5 with acetaminophen Do  not use Vicodin or other medicines like Valium, Xanax, or alcohol with this  Discuss Chantix and Wellbutrin with your primary care  Use a Nicotine pacth Use the Level 3 patch, 21 mg at first Gradually taper to the 14 then 7 mg patches as directed on the box It is safe to use the patch and gum at the same time  The celiac artery is a blood vessel that brings blood to the intestines and spleen.  As we discussed, the spleen may or  may not survive.  If it does not, no surgery is needed to remove it, the risk is not worth the benefit.   Increase activity slowly   Complete by: As directed      Allergies as of 03/15/2021   No Known Allergies     Medication List    STOP taking these medications   HYDROcodone-acetaminophen 5-325 MG tablet Commonly known as: NORCO/VICODIN     TAKE these medications   Aspirin Low Dose 81 MG EC tablet Generic drug: aspirin TAKE 1 TABLET (81 MG TOTAL) BY MOUTH DAILY.   clotrimazole 1 % cream Commonly known as: LOTRIMIN Apply topically 2 (two) times daily.   Eliquis 5 MG Tabs tablet Generic drug: apixaban Take 2 tablets (10 mg total) by mouth 2 (two) times daily for 5 days.   Eliquis 5 MG Tabs tablet Generic drug: apixaban Take 1 tablet (5 mg total) by mouth 2 (two) times daily. Start taking on: Mar 21, 2021   nicotine 14 mg/24hr patch Commonly known as: NICODERM CQ - dosed in mg/24 hours Place 1 patch (14 mg total) onto the skin daily. Start taking on: Mar 16, 2021   oxyCODONE-acetaminophen 7.5-325 MG tablet Commonly known as: PERCOCET Take 1 tablet by mouth every 6 (six) hours as needed for moderate pain.   promethazine 25 MG tablet Commonly known as: PHENERGAN Take 1 tablet (25 mg total) by mouth every 6 (six) hours as needed for nausea or vomiting.   rosuvastatin 20 MG tablet Commonly known as: Crestor Take 1 tablet (20 mg total) by mouth daily.   senna-docusate 8.6-50 MG tablet Commonly known as: Senokot-S Take 1 tablet by mouth 2 (two) times daily.       Follow-up Information    Dollene Cleveland, DO. Go in 1 week(s).   Specialty: Family Medicine Why: On 5/23 at 9:50AM Contact information: [address] [place] [address] [phone]        Chuck Hint, MD. Go in 2 week(s).   Specialties: Vascular Surgery, Cardiology Why: On 5/25 with Aggie Moats at [facility] information: [address] [place]  [address] [phone]              No Known Allergies  Consultations:  Vascular Surgery   Procedures/Studies: CT ABDOMEN PELVIS W CONTRAST  Result Date: 03/12/2021 CLINICAL DATA:  46 year old male with abdominal pain. EXAM: CT ABDOMEN AND PELVIS WITH CONTRAST TECHNIQUE: Multidetector CT imaging of the abdomen and pelvis was performed using the standard protocol following bolus administration of intravenous contrast. CONTRAST:  OMNIPAQUE IOHEXOL 300 MG/ML  SOLN COMPARISON:  None. FINDINGS: Lower chest: Left lung base linear atelectasis/scarring. The visualized lung bases are otherwise clear. No intra-abdominal free air or free fluid. Hepatobiliary: No focal liver abnormality is seen. No gallstones, gallbladder wall thickening, or biliary dilatation. Pancreas: The pancreas is unremarkable. Spleen: There is diffuse hypoenhancement of the spleen consistent with splenic infarct. Adrenals/Urinary Tract: The adrenal glands are unremarkable. There is no  hydronephrosis on either side. There is symmetric enhancement and excretion of contrast by both kidneys. The visualized ureters and urinary bladder appear unremarkable. Stomach/Bowel: There is no bowel obstruction or active inflammation. The appendix is normal. Vascular/Lymphatic: Mild calcified and noncalcified plaque of the distal abdominal aorta and iliac arteries. The IVC is unremarkable. There is thrombosis of the splenic vein from the splenic hilum into the porta splenic confluence. There is dilatation of the vein suggestive of acute thrombus. The SMV and main portal vein are patent. No portal venous gas. The splenic artery also appears thrombosed. Findings may be sequela of prior pancreatitis. Clinical correlation is recommended. Reproductive: The prostate and seminal vesicles are grossly unremarkable. Other: None Musculoskeletal: No acute or significant osseous findings. IMPRESSION: 1. 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 This may be sequela of prior pancreatitis. Clinical correlation is recommended. 2. No bowel obstruction. Normal appendix. 3. Aortic Atherosclerosis (ICD10-I70.0). Electronically Signed   By: Elgie Collard M.D.   On: 03/12/2021 02:09   DG Abd Portable 1V  Result Date: 03/14/2021 CLINICAL DATA:  Abdominal distension EXAM: PORTABLE ABDOMEN - 1 VIEW COMPARISON:  03/12/2021 FINDINGS: Scattered large and small bowel gas is noted. No obstructive changes are seen. No free air is noted. No bony abnormality is seen. IMPRESSION: No acute abnormality noted. Electronically Signed   By: Alcide Clever M.D.   On: 03/14/2021 18:03      Subjective: Feeling well.  Good appetite.  No confusion, fever.  No post-prandial pain.  No nausea vomiting.  Discharge Exam: Vitals:   03/14/21 2255 03/15/21 0553  BP: 104/72 102/77  Pulse: 97 93  Resp: 16 16  Temp: 98.5 F (36.9 C) 98.8 F (37.1 C)  SpO2: 97% 94%   Vitals:   03/14/21 0613 03/14/21 1422 03/14/21 2255 03/15/21 0553  BP: 101/73 100/64 104/72 102/77  Pulse: 97 97 97 93  Resp: Temp: 98.9 F (37.2 C) 98.2 F (36.8 C) 98.5 F (36.9 C) 98.8 F (37.1 C)  TempSrc: Oral Oral Oral Oral  SpO2: 94% 97% 97% 94%  Weight:        General: Pt is alert, awake, not in acute distress Cardiovascular: RRR, nl S1-S2, no murmurs appreciated.   No LE edema.   Respiratory: Normal respiratory rate and rhythm.  CTAB without rales or wheezes. Abdominal: Abdomen soft and non-tender.  No distension or HSM.   Neuro/Psych: Strength symmetric in upper and lower extremities.  Judgment and insight appear normal.   The results of significant diagnostics from this hospitalization (including imaging, microbiology, ancillary and laboratory) are listed below for reference.     Microbiology: Recent Results (from the past 240 hour(s))  Resp Panel by RT-PCR (Flu A&B, Covid) Nasopharyngeal Swab     Status: None   Collection Time: 03/12/21  1:32 AM    Specimen: Nasopharyngeal Swab; Nasopharyngeal(NP) swabs in vial transport medium  Result Value Ref Range Status   SARS Coronavirus 2 by RT PCR NEGATIVE NEGATIVE Final    Comment: (NOTE) SARS-CoV-2 target nucleic acids are NOT DETECTED.  The SARS-CoV-2 RNA is generally detectable in upper respiratory specimens during the acute phase of infection. The lowest concentration of SARS-CoV-2 viral copies this assay can detect is 138 copies/mL. A negative result does not preclude SARS-Cov-2 infection and should not be used as the sole basis for treatment or other patient management decisions. A negative result may occur with  improper specimen collection/handling, submission of specimen other  than nasopharyngeal swab, presence of viral mutation(s) within the areas targeted by this assay, and inadequate number of viral copies(<138 copies/mL). A negative result must be combined with clinical observations, patient history, and epidemiological information. The expected result is Negative.  Fact Sheet for Patients:  BloggerCourse.com  Fact Sheet for Healthcare Providers:  SeriousBroker.it  This test is no t yet approved or cleared by the [place] FDA and  has been authorized for detection and/or diagnosis of SARS-CoV-2 by FDA under an Emergency Use Authorization (EUA). This EUA will remain  in effect (meaning this test can be used) for the duration of the COVID-19 declaration under Section 564(b)(1) of the Act, 21 U.S.C.section 360bbb-3(b)(1), unless the authorization is terminated  or revoked sooner.       Influenza A by PCR NEGATIVE NEGATIVE Final   Influenza B by PCR NEGATIVE NEGATIVE Final    Comment: (NOTE) The Xpert Xpress SARS-CoV-2/FLU/RSV plus assay is intended as an aid in the diagnosis of influenza from Nasopharyngeal swab specimens and should not be used as a sole basis for treatment. Nasal washings and aspirates are  unacceptable for Xpert Xpress SARS-CoV-2/FLU/RSV testing.  Fact Sheet for Patients: BloggerCourse.com  Fact Sheet for Healthcare Providers: SeriousBroker.it  This test is not yet approved or cleared by the [place] FDA and has been authorized for detection and/or diagnosis of SARS-CoV-2 by FDA under an Emergency Use Authorization (EUA). This EUA will remain in effect (meaning this test can be used) for the duration of the COVID-19 declaration under Section 564(b)(1) of the Act, 21 U.S.C. section 360bbb-3(b)(1), unless the authorization is terminated or revoked.  Performed at [facility], [address], [place], [place] [address]      Labs: BNP (last 3 results) No results for input(s): BNP in the last 8760 hours. Basic Metabolic Panel: Recent Labs  Lab 03/12/21 0016 03/13/21 0514  NA 133* 131*  K 4.1 3.8  CL 101 100  CO2 25 22  GLUCOSE 161* 91  BUN 12 9  CREATININE 0.77 0.82  CALCIUM 9.5 8.3*   Liver Function Tests: Recent Labs  Lab 03/12/21 0016  AST 16  ALT 20  ALKPHOS 91  BILITOT 0.5  PROT 8.6*  ALBUMIN 3.7   Recent Labs  Lab 03/12/21 0016 03/14/21 1538  LIPASE 30 32   No results for input(s): AMMONIA in the last 168 hours. CBC: Recent Labs  Lab 03/12/21 0016 03/13/21 0514  WBC 12.2* 11.9*  HGB 14.9 12.7*  HCT 45.0 39.1  MCV 91.3 91.8  PLT 186 186   Cardiac Enzymes: No results for input(s): CKTOTAL, CKMB, CKMBINDEX, TROPONINI in the last 168 hours. BNP: Invalid input(s): POCBNP CBG: No results for input(s): GLUCAP in the last 168 hours. D-Dimer No results for input(s): DDIMER in the last 72 hours. Hgb A1c No results for input(s): HGBA1C in the last 72 hours. Lipid Profile No results for input(s): CHOL, HDL, LDLCALC, TRIG, CHOLHDL, LDLDIRECT in the last 72 hours. Thyroid function studies No results for input(s): TSH, T4TOTAL, T3FREE, THYROIDAB in the last 72  hours.  Invalid input(s): FREET3 Anemia work up No results for input(s): VITAMINB12, FOLATE, FERRITIN, TIBC, IRON, RETICCTPCT in the last 72 hours. Urinalysis    Component Value Date/Time   COLORURINE YELLOW 02/04/2021 1129   APPEARANCEUR HAZY (A) 02/04/2021 1129   LABSPEC 1.020 02/04/2021 1129   PHURINE 6.0 02/04/2021 1129   GLUCOSEU NEGATIVE 02/04/2021 1129   HGBUR NEGATIVE 02/04/2021 1129   BILIRUBINUR NEGATIVE 02/04/2021 1129  KETONESUR NEGATIVE 02/04/2021 1129   PROTEINUR NEGATIVE 02/04/2021 1129   NITRITE NEGATIVE 02/04/2021 1129   LEUKOCYTESUR NEGATIVE 02/04/2021 1129   Sepsis Labs Invalid input(s): PROCALCITONIN,  WBC,  LACTICIDVEN Microbiology Recent Results (from the past 240 hour(s))  Resp Panel by RT-PCR (Flu A&B, Covid) Nasopharyngeal Swab     Status: None   Collection Time: 03/12/21  1:32 AM   Specimen: Nasopharyngeal Swab; Nasopharyngeal(NP) swabs in vial transport medium  Result Value Ref Range Status   SARS Coronavirus 2 by RT PCR NEGATIVE NEGATIVE Final    Comment: (NOTE) SARS-CoV-2 target nucleic acids are NOT DETECTED.  The SARS-CoV-2 RNA is generally detectable in upper respiratory specimens during the acute phase of infection. The lowest concentration of SARS-CoV-2 viral copies this assay can detect is 138 copies/mL. A negative result does not preclude SARS-Cov-2 infection and should not be used as the sole basis for treatment or other patient management decisions. A negative result may occur with  improper specimen collection/handling, submission of specimen other than nasopharyngeal swab, presence of viral mutation(s) within the areas targeted by this assay, and inadequate number of viral copies(<138 copies/mL). A negative result must be combined with clinical observations, patient history, and epidemiological information. The expected result is Negative.  Fact Sheet for Patients:  BloggerCourse.com  Fact Sheet for  Healthcare Providers:  SeriousBroker.it  This test is no t yet approved or cleared by the [place] FDA and  has been authorized for detection and/or diagnosis of SARS-CoV-2 by FDA under an Emergency Use Authorization (EUA). This EUA will remain  in effect (meaning this test can be used) for the duration of the COVID-19 declaration under Section 564(b)(1) of the Act, 21 U.S.C.section 360bbb-3(b)(1), unless the authorization is terminated  or revoked sooner.       Influenza A by PCR NEGATIVE NEGATIVE Final   Influenza B by PCR NEGATIVE NEGATIVE Final    Comment: (NOTE) The Xpert Xpress SARS-CoV-2/FLU/RSV plus assay is intended as an aid in the diagnosis of influenza from Nasopharyngeal swab specimens and should not be used as a sole basis for treatment. Nasal washings and aspirates are unacceptable for Xpert Xpress SARS-CoV-2/FLU/RSV testing.  Fact Sheet for Patients: BloggerCourse.com  Fact Sheet for Healthcare Providers: SeriousBroker.it  This test is not yet approved or cleared by the [place] FDA and has been authorized for detection and/or diagnosis of SARS-CoV-2 by FDA under an Emergency Use Authorization (EUA). This EUA will remain in effect (meaning this test can be used) for the duration of the COVID-19 declaration under Section 564(b)(1) of the Act, 21 U.S.C. section 360bbb-3(b)(1), unless the authorization is terminated or revoked.  Performed at [facility], [address], [place], [place] [address]      Time coordinating discharge: 25  minutes The [place] controlled substances registry was reviewed for this patient prior to filling the <5 days supply controlled substances script.    30 Day Unplanned Readmission Risk Score   Flowsheet Row ED to Hosp-Admission (Current) from 03/12/2021 in [facility]  30 Day Unplanned  Readmission Risk Score (%) 18.16 Filed at 03/15/2021 1200     This score is the patient's risk of an unplanned readmission within 30 days of being discharged (0 -100%). The score is based on dignosis, age, lab data, medications, orders, and past utilization.   Low:  0-14.9   Medium: 15-21.9   High: 22-29.9   Extreme: 30 and above  SIGNED:   Alberteen Samhristopher P Rocco Kerkhoff, MD  [facility] 03/15/2021, 6:31 PM

## 2021-03-20 ENCOUNTER — Encounter: Payer: Self-pay | Admitting: Cardiology

## 2021-03-25 ENCOUNTER — Other Ambulatory Visit: Payer: Self-pay

## 2021-03-25 ENCOUNTER — Ambulatory Visit (INDEPENDENT_AMBULATORY_CARE_PROVIDER_SITE_OTHER): Payer: 59 | Admitting: Family Medicine

## 2021-03-25 VITALS — BP 121/72 | HR 100 | Ht 66.0 in | Wt 155.8 lb

## 2021-03-25 DIAGNOSIS — B351 Tinea unguium: Secondary | ICD-10-CM | POA: Diagnosis not present

## 2021-03-25 DIAGNOSIS — I739 Peripheral vascular disease, unspecified: Secondary | ICD-10-CM | POA: Diagnosis not present

## 2021-03-25 HISTORY — DX: Tinea unguium: B35.1

## 2021-03-25 NOTE — Patient Instructions (Signed)
Thank you for coming in to see [place] today! Please see below to review our plan for today's visit:  1. I have placed a referral to Podiatry to have the pain and deformity of your left toe addressed. Due to your history of limb ischemia, peripheral artery disease and now being on blood thinners I would strongly recommend that a specialist follow up with/address the toe.  2. Please ask Dr. Edilia Bo if he would be willing to address the toe nail!   Please call the clinic at [phone] if your symptoms worsen or you have any concerns. It was our pleasure to serve you!   Dr. Peggyann Shoals [facility]

## 2021-03-25 NOTE — Progress Notes (Signed)
    SUBJECTIVE:   CHIEF COMPLAINT / HPI:   Pain in left great toe: Patient presents to clinic today with concerns for continued pain in his left great toe.  Patient has a history of peripheral artery disease and lower limb ischemia requiring bypass and left lower extremity from which patient continues to heal well.  He reports having pain directly on his toenail, he denies any pain in the surrounding area to the sides, tip of the toe, or underneath the toe.  Pain is 0 when he is relaxing and the toe is bear, pain reaches 10/10 when he is wearing socks and shoes and feels as if the "nail is being compressed".  He denies any reinjury, accidents, or trauma to the toe.  He continues to smoke about 2 packs of cigarettes over 3 days, a significant decrease from his prior smoking habit.  PERTINENT  PMH / PSH: Tobacco dependence, dyslipidemia, history of NSTEMI, critical lower limb ischemia  OBJECTIVE:   BP 121/72   Pulse 100   Ht 5\' 6"  (1.676 m)   Wt 155 lb 12.8 oz (70.7 kg)   SpO2 96%   BMI 25.15 kg/m    Physical exam: General: Well-appearing, no acute distress Respiratory: Comfortable work of breathing on room air Extremities: nail deformity appreciated to great toe of left foot (see photo below), good ROM to hallux, sensation is somewhat diminished to left great toe but this is unchanged from previous exam, no tenderness to palpation of soft tissue surrounding nail, no discharge or purulent material concerning for paronychia/infection; nail fungus is appreciated to most of the toe nails; bilateral 2+ DP pulses appreciated   March 2022                  Mar 25, 2021   ASSESSMENT/PLAN:   Onychomycosis of toenail Patient with what is most likely fungal infection of his great toenail and pain to his toenail.  No concern at this time for acute bacterial infection, paronychia, etc.  Toe with brisk capillary refill and normal range of motion.  Patient may benefit from removal of toenail;  however, due to his history of critical limb ischemia and currently being on Eliquis for recent splenic artery thrombosis would prefer that he specialist handle this. -Patient referred to podiatry to address toenail -Patient can also ask vascular surgery if they would be willing to remove the toenail -We will also consider starting patient on terbinafine; however, as this would be a 3-64-month treatment would prefer to verify fungal infection with nail biopsy    6-month, DO [facility]

## 2021-03-26 ENCOUNTER — Ambulatory Visit: Payer: 59 | Admitting: Cardiology

## 2021-03-26 ENCOUNTER — Encounter: Payer: Self-pay | Admitting: Family Medicine

## 2021-03-26 NOTE — Assessment & Plan Note (Addendum)
Patient with what is most likely fungal infection of his great toenail and pain to his toenail.  No concern at this time for acute bacterial infection, paronychia, etc.  Toe with brisk capillary refill and normal range of motion.  Patient may benefit from removal of toenail; however, due to his history of critical limb ischemia and currently being on Eliquis for recent splenic artery thrombosis would prefer that he specialist handle this. -Patient referred to podiatry to address toenail -Patient can also ask vascular surgery if they would be willing to remove the toenail -We will also consider starting patient on terbinafine; however, as this would be a 3-60-month treatment would prefer to verify fungal infection with nail biopsy

## 2021-03-27 ENCOUNTER — Ambulatory Visit (HOSPITAL_COMMUNITY)
Admission: RE | Admit: 2021-03-27 | Discharge: 2021-03-27 | Disposition: A | Discharge status: Home / Self Care | Payer: 59 | Source: Ambulatory Visit | Attending: Vascular Surgery | Admitting: Vascular Surgery

## 2021-03-27 ENCOUNTER — Other Ambulatory Visit: Payer: Self-pay

## 2021-03-27 ENCOUNTER — Ambulatory Visit (INDEPENDENT_AMBULATORY_CARE_PROVIDER_SITE_OTHER)
Admission: RE | Admit: 2021-03-27 | Discharge: 2021-03-27 | Disposition: A | Discharge status: Home / Self Care | Payer: 59 | Source: Ambulatory Visit | Attending: Physician Assistant | Admitting: Physician Assistant

## 2021-03-27 ENCOUNTER — Ambulatory Visit (INDEPENDENT_AMBULATORY_CARE_PROVIDER_SITE_OTHER): Payer: 59 | Admitting: Physician Assistant

## 2021-03-27 VITALS — BP 100/74 | HR 76 | Temp 97.7°F | Resp 20 | Ht 66.0 in | Wt 159.3 lb

## 2021-03-27 DIAGNOSIS — I739 Peripheral vascular disease, unspecified: Secondary | ICD-10-CM

## 2021-03-27 NOTE — Progress Notes (Signed)
Office Note     CC:  follow up Requesting Provider:  Dollene Cleveland, DO  HPI: Evan Rodriguez is a 45 y.o. (May 24, 1976) male who presents for follow up of peripheral artery disease. He is s/p left superficial femoral artery to peroneal artery bypass with saphenous vein by Dr. 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 He was seen on 03/06/21 and scheduled to return to follow up today with non invasive studies. His incisions are healing well. He does have small abrasion on the lower leg from his boots. He continues to have pain in left great toe but overall feels that it is doing better. He does have swelling in left leg but has been trying to elevate more which is helping.He otherwise denies any pain on ambulation or at rest and no new ulcers. He has been compliant with his aspirin and statin. He is also on Eliquis  The pt is on a statin for cholesterol management.  The pt is on a daily aspirin.   Other AC: Eliquis The pt is not on medication for hypertension.   The pt is not diabetic. Tobacco hx: Current smoker  Past Medical History:  Diagnosis Date  . Abdominal pain 03/12/2021  . Coronary artery disease   . COVID-19   . Elevated sed rate   . Hypokalemia 01/22/2021  . Hyponatremia 03/12/2021  . NSTEMI (non-ST elevated myocardial infarction) (HCC) 02/22/2016   Stented  . Vaping nicotine dependence, non-tobacco product 01/22/2021    Past Surgical History:  Procedure Laterality Date  . ABDOMINAL AORTOGRAM N/A 01/23/2021   Procedure: ABDOMINAL AORTOGRAM;  Surgeon: Leonie Douglas, MD;  Location: [facility] INVASIVE CV LAB;  Service: Cardiovascular;  Laterality: N/A;  . BYPASS GRAFT FEMORAL-PERONEAL Left 02/05/2021   Procedure: LEFT SUPERFICIAL FEMORAL-PERONEAL ARTERY BYPASS GRAFT WITH SUBFACIAL, NON-REVERSED  VEIN.;  Surgeon: Chuck Hint, MD;  Location: [facility] OR;  Service: Vascular;  Laterality: Left;  . CARDIAC CATHETERIZATION N/A  02/22/2016   Procedure: Left Heart Cath and Coronary Angiography;  Surgeon: Tonny Bollman, MD;  Location: [facility] INVASIVE CV LAB;  Service: Cardiovascular;  Laterality: N/A;  . CARDIAC CATHETERIZATION N/A 02/22/2016   Procedure: Coronary Stent Intervention;  Surgeon: Tonny Bollman, MD;  Location: [facility] INVASIVE CV LAB;  Service: Cardiovascular;  Laterality: N/A;  . INTRAOPERATIVE ARTERIOGRAM Left 02/05/2021   Procedure: INTRA OPERATIVE ARTERIOGRAM- LEFT LOWER EXTREMITY;  Surgeon: Chuck Hint, MD;  Location: [facility] OR;  Service: Vascular;  Laterality: Left;  . LOWER EXTREMITY ANGIOGRAPHY Left 01/23/2021   Procedure: Lower Extremity Angiography;  Surgeon: Leonie Douglas, MD;  Location: [facility] INVASIVE CV LAB;  Service: Cardiovascular;  Laterality: Left;  . PERCUTANEOUS CORONARY STENT INTERVENTION (PCI-S)    . WRIST SURGERY      Social History   Socioeconomic History  . Marital status: Widowed    Spouse name: Not on file  . Number of children: Not on file  . Years of education: Not on file  . Highest education level: Not on file  Occupational History  . Occupation: [employer] doctor  Tobacco Use  . Smoking status: Current Every Day Smoker    Packs/day: 1.50    Years: 20.00    Pack years: 30.00    Types: Cigarettes  . Smokeless tobacco: Former Neurosurgeon    Types: Chew    Quit date: 02/05/2019  . Tobacco comment: quit chew tobacco in 2020  Vaping Use  . Vaping Use: Some days  . Devices:  Salt Base; refillable.   Substance and Sexual Activity  . Alcohol use: Yes    Comment: occasionally/monthly  . Drug use: No  . Sexual activity: Not on file  Other Topics Concern  . Not on file  Social History Narrative  . Not on file   Social Determinants of Health   Financial Resource Strain: Not on file  Food Insecurity: Not on file  Transportation Needs: Not on file  Physical Activity: Not on file  Stress: Not on file  Social Connections: Not on file  Intimate Partner Violence: Not on file     Family History  Problem Relation Age of Onset  . Hypertension Maternal Grandfather     Current Outpatient Medications  Medication Sig Dispense Refill  . apixaban (ELIQUIS) 5 MG TABS tablet Take 1 tablet (5 mg total) by mouth 2 (two) times daily. 60 tablet 2  . clotrimazole (LOTRIMIN) 1 % cream Apply topically 2 (two) times daily. 30 g 0  . nicotine (NICODERM CQ - DOSED IN MG/24 HOURS) 14 mg/24hr patch Place 1 patch (14 mg total) onto the skin daily. 28 patch 0  . oxyCODONE-acetaminophen (PERCOCET) 7.5-325 MG tablet Take 1 tablet by mouth every 6 (six) hours as needed for moderate pain. 12 tablet 0  . promethazine (PHENERGAN) 25 MG tablet Take 1 tablet (25 mg total) by mouth every 6 (six) hours as needed for nausea or vomiting. 60 tablet 1  . rosuvastatin (CRESTOR) 20 MG tablet Take 1 tablet (20 mg total) by mouth daily. 90 tablet 3  . senna-docusate (SENOKOT-S) 8.6-50 MG tablet Take 1 tablet by mouth 2 (two) times daily.     No current facility-administered medications for this visit.    No Known Allergies   REVIEW OF SYSTEMS:  [X]  denotes positive finding, [ ]  denotes negative finding Cardiac  Comments:  Chest pain or chest pressure:    Shortness of breath upon exertion:    Short of breath when lying flat:    Irregular heart rhythm:        Vascular    Pain in calf, thigh, or hip brought on by ambulation:    Pain in feet at night that wakes you up from your sleep:     Blood clot in your veins:    Leg swelling:  X       Pulmonary    Oxygen at home:    Productive cough:     Wheezing:         Neurologic    Sudden weakness in arms or legs:     Sudden numbness in arms or legs:     Sudden onset of difficulty speaking or slurred speech:    Temporary loss of vision in one eye:     Problems with dizziness:         Gastrointestinal    Blood in stool:     Vomited blood:         Genitourinary    Burning when urinating:     Blood in urine:        Psychiatric    Major  depression:         Hematologic    Bleeding problems:    Problems with blood clotting too easily:        Skin    Rashes or ulcers:        Constitutional    Fever or chills:      PHYSICAL EXAMINATION:  Vitals:   03/27/21 1121  BP: 100/74  Pulse: 76  Resp: 20  Temp: 97.7 F (36.5 C)  TempSrc: Temporal  SpO2: 100%  Weight: 159 lb 4.8 oz (72.3 kg)  Height: 5\' 6"  (1.676 m)    General:  WDWN in NAD; vital signs documented above Gait: Normal HENT: WNL, normocephalic Pulmonary: normal non-labored breathing, without wheezing Cardiac: regular HR, without  Murmurs Vascular Exam/Pulses:  Right Left  Radial 2+ (normal) 2+ (normal)  Femoral 2+ (normal) 2+ (normal)  Popliteal 2+ (normal) 2+ (normal)  DP 2+ (normal) Not palpable  PT Not palpable Not palpable   Extremities: without ischemic changes, without Gangrene , without cellulitis; without open wounds; his left leg incisions are healing well. He does have dry eschar in the above knee incision. This does not appear infected. He also has small area in the lower leg incision and a superficial area of abrasion present Musculoskeletal: no muscle wasting or atrophy  Neurologic: A&O X 3;  No focal weakness or paresthesias are detected Psychiatric:  The pt has Normal affect.   Non-Invasive Vascular Imaging:  03/27/21 +-------+-----------+-----------+------------+------------+  ABI/TBIToday's ABIToday's TBIPrevious ABIPrevious TBI  +-------+-----------+-----------+------------+------------+  Right 1.20    0.89    1.21    0.53      +-------+-----------+-----------+------------+------------+  Left  0.97    0.29    0.29    0        +-------+-----------+-----------+------------+------------+   VAS 03/29/21 Lower Extremity Bypass Graft Duplex: Summary: Left: Patent left mid superficial femoral artery to peroneal artery bypass graft.    ASSESSMENT/PLAN:: 45 y.o. male here for follow up for  follow up of peripheral artery disease. He is s/p left superficial femoral artery to peroneal artery bypass with saphenous vein by Dr. 49 on 02/05/2021 due to critical ischemia with gangrene of left great toe and fourth toe.  ABIs today show adequate perfusion of bilateral lower extremities. Greatly improved left lower extremity following revascularization. Duplex shows patent bypass graft. He does have monophasic flow and some dampened velocities in the distal graft that will need to be followed at closer interval - Continue Aspirin, Statin, Eliquis - Encouraged him to continue to elevate his legs as needed - Advised him to follow up sooner should he have new or worsening symptoms or concerns about incisions being infected- redness, warmth, drainage, pain -Plan to have him return in 4-6 weeks for incision check just to make sure he is adequately healing   04/07/2021, PA-C [facility] [phone]  Clinic MD:  [phone]

## 2021-03-29 ENCOUNTER — Telehealth (HOSPITAL_COMMUNITY): Payer: Self-pay | Admitting: Pharmacist

## 2021-03-29 ENCOUNTER — Other Ambulatory Visit (HOSPITAL_COMMUNITY): Payer: Self-pay

## 2021-03-29 NOTE — Telephone Encounter (Signed)
Pharmacy Transitions of Care Follow-up Telephone Call  Date of discharge: 03/15/21  How have you been since you were released from the hospital? Good  Medication Accessibility:  [facility]  Was the patient provided with refills on discharged medications? Yes  Have all prescriptions been transferred from [facility] to home pharmacy? Yes  Is the patient able to afford medications?Yes    Medication Review:   APIXABAN (ELIQUIS)  Apixaban 10 mg BID initiated on - Discussed importance of taking medication around the same time everyday  - Reviewed potential DDIs with patient  - Advised patient of medications to avoid (NSAIDs, ASA)  - Educated that Tylenol (acetaminophen) will be the preferred analgesic to prevent risk of bleeding  - Emphasized importance of monitoring for signs and symptoms of bleeding (abnormal bruising, prolonged bleeding, nose bleeds, bleeding from gums, discolored urine, black tarry stools)  - Advised patient to alert all providers of anticoagulation therapy prior to starting a new medication or having a procedure   Follow-up Appointments:  PCP Hospital f/u appt confirmed? Yes If their condition worsens, is the pt aware to call PCP or go to the Emergency Dept.? yes  Final Patient Assessment: Patient states he is doing well since discharge.  Reviewed medication with patient and any potential side effects

## 2021-04-10 ENCOUNTER — Encounter: Payer: Self-pay | Admitting: Podiatry

## 2021-04-10 ENCOUNTER — Other Ambulatory Visit: Payer: Self-pay

## 2021-04-10 ENCOUNTER — Ambulatory Visit (INDEPENDENT_AMBULATORY_CARE_PROVIDER_SITE_OTHER): Payer: 59 | Admitting: Podiatry

## 2021-04-10 DIAGNOSIS — L03032 Cellulitis of left toe: Secondary | ICD-10-CM

## 2021-04-10 DIAGNOSIS — I999 Unspecified disorder of circulatory system: Secondary | ICD-10-CM

## 2021-04-10 NOTE — Patient Instructions (Signed)

## 2021-04-10 NOTE — Progress Notes (Signed)
Subjective:   Patient ID: Evan Rodriguez, male   DOB: 45 y.o.   MRN: [id number]   HPI Patient presents with severe pain in his left big toe and has been treated for vascular disease recently and was revascularized in the last several months and still has pain and states that he does continue to smoke 1-1/2 packs/day.  Patient is not currently active due to pain   Review of Systems  All other systems reviewed and are negative.       Objective:  Physical Exam Vitals and nursing note reviewed.  Constitutional:      Appearance: He is well-developed.  Pulmonary:     Effort: Pulmonary effort is normal.  Musculoskeletal:        General: Normal range of motion.  Skin:    General: Skin is warm.  Neurological:     Mental Status: He is alert.     Vascular status diminished with obvious multiple signs of vascular disease being followed by vascular surgeon.  Left hallux shows distal necrosis of the distal one third with dark discoloration and mild drainage localized with no proximal edema erythema drainage noted.  Patient had cool feet and inadequate digital perfusion     Assessment:  Most likely patient is dealing with chronic vascular disease with necrosis of the distal tissue left with possible abscess or paronychia like formation     Plan:  H&P I explained to him the impact of vascular disease on his problem and smoking and I implored him to continue stopping smoking.  I went ahead today I anesthetized the left hallux 60 mg like Marcaine mixture sterile prep applied and using sterile instrumentation I remove the distal one third of the nailbed I carefully treated out necrotic tissue was given instructions for soaks and open toed shoes.  He does understand ultimately may require amputation and if there is inadequate circulation he may not heal which was discussed with him today

## 2021-05-08 ENCOUNTER — Ambulatory Visit (INDEPENDENT_AMBULATORY_CARE_PROVIDER_SITE_OTHER): Payer: Self-pay | Admitting: Physician Assistant

## 2021-05-08 ENCOUNTER — Other Ambulatory Visit: Payer: Self-pay

## 2021-05-08 ENCOUNTER — Encounter: Payer: Self-pay | Admitting: Vascular Surgery

## 2021-05-08 VITALS — BP 122/80 | HR 111 | Temp 98.2°F | Resp 20 | Ht 66.0 in | Wt 158.5 lb

## 2021-05-08 DIAGNOSIS — I739 Peripheral vascular disease, unspecified: Secondary | ICD-10-CM

## 2021-05-08 NOTE — Progress Notes (Signed)
  POST OPERATIVE OFFICE NOTE    CC:  F/u for surgery  HPI:  This is a 45 y.o. male who is s/p left superficial femoral artery to peroneal artery bypass with saphenous vein by Dr. 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  He presents today for follow-up of toe wounds.  States he recently had distal left great toenail removal.  He reports some residual numbness and cool sensation to his left foot.  He denies claudication or rest pain.  He is compliant with his medications.  He continues to smoke.  No Known Allergies  Current Outpatient Medications  Medication Sig Dispense Refill   apixaban (ELIQUIS) 5 MG TABS tablet Take 1 tablet (5 mg total) by mouth 2 (two) times daily. 60 tablet 2   clotrimazole (LOTRIMIN) 1 % cream Apply topically 2 (two) times daily. 30 g 0   nicotine (NICODERM CQ - DOSED IN MG/24 HOURS) 14 mg/24hr patch Place 1 patch (14 mg total) onto the skin daily. 28 patch 0   promethazine (PHENERGAN) 25 MG tablet Take 1 tablet (25 mg total) by mouth every 6 (six) hours as needed for nausea or vomiting. 60 tablet 1   rosuvastatin (CRESTOR) 20 MG tablet Take 1 tablet (20 mg total) by mouth daily. 90 tablet 3   senna-docusate (SENOKOT-S) 8.6-50 MG tablet Take 1 tablet by mouth 2 (two) times daily.     oxyCODONE-acetaminophen (PERCOCET) 7.5-325 MG tablet Take 1 tablet by mouth every 6 (six) hours as needed for moderate pain. (Patient not taking: Reported on 05/08/2021) 12 tablet 0   No current facility-administered medications for this visit.     ROS:  See HPI  BP 122/80 (BP Location: Left Arm, Patient Position: Sitting, Cuff Size: Normal)   Pulse (!) 111   Temp 98.2 F (36.8 C) (Temporal)   Resp 20   Ht 5\' 6"  (1.676 m)   Wt 158 lb 8 oz (71.9 kg)   SpO2 98%   BMI 25.58 kg/m   Physical Exam:  General appearance: Awake, alert in no apparent distress Cardiac: Heart rate and rhythm are regular Respirations:  Nonlabored Incisions: Left groin, thigh incisions are all well healed. Small skin edge opening of left lower leg incision. I probed this with sterile cotton tipped applicator. No drainage or tunneling. Extremities: Both feet are warm.  Left 1 and 4th toe ulcers are healed. Left foot with decreased sensation, intact motor function and chronic skin color change without skin breakdown. Pulse/Doppler exam: Palpable right PT pulse.       Assessment/Plan:  This is a 45 y.o. male who is s/p: left superficial femoral artery to peroneal artery bypass with saphenous vein by Dr. 49 on 02/05/2021 due to critical ischemia with gangrene of left great toe and fourth toe. Toe ulcers/skin changes have healed. Small skin edge opening of left lower leg incision. I advised him to monitor this area and avoid high boots that might traumatize this area. Keep clean and dry. Encouraged smoking cessation. Return to clinic if lower leg incision worsens or develops lower extremity pain or skin breakdown.  May return to work today with light duty restrictions through the end of the month and return to work without restrictions August 1.  He will contact Dr. September for refills on apixaban and statin.  Follow-up in 9 months with non-invasive studies.   Dareen Piano, PA-C [facility] [phone]  Clinic MD:  Dr. [phone]

## 2021-05-09 ENCOUNTER — Other Ambulatory Visit: Payer: Self-pay

## 2021-05-09 DIAGNOSIS — I2510 Atherosclerotic heart disease of native coronary artery without angina pectoris: Secondary | ICD-10-CM | POA: Insufficient documentation

## 2021-05-09 DIAGNOSIS — I739 Peripheral vascular disease, unspecified: Secondary | ICD-10-CM

## 2021-05-09 NOTE — Progress Notes (Deleted)
Cardiology Office Note   Date:  05/09/2021   ID:  Evan Rodriguez, DOB 09-22-1976, MRN [id number]  PCP:  Reece Leader, DO  Cardiologist:   None Referring:  ***  No chief complaint on file.     History of Present Illness: Evan Rodriguez is a 45 y.o. male who presents for follow up of CAD.  She had disease as illustrated below. ***     Past Medical History:  Diagnosis Date   Abdominal pain 03/12/2021   Coronary artery disease    COVID-19    Elevated sed rate    Hypokalemia 01/22/2021   Hyponatremia 03/12/2021   NSTEMI (non-ST elevated myocardial infarction) (HCC) 02/22/2016   Stented   Vaping nicotine dependence, non-tobacco product 01/22/2021    Past Surgical History:  Procedure Laterality Date   ABDOMINAL AORTOGRAM N/A 01/23/2021   Procedure: ABDOMINAL AORTOGRAM;  Surgeon: Leonie Douglas, MD;  Location: [facility] INVASIVE CV LAB;  Service: Cardiovascular;  Laterality: N/A;   BYPASS GRAFT FEMORAL-PERONEAL Left 02/05/2021   Procedure: LEFT SUPERFICIAL FEMORAL-PERONEAL ARTERY BYPASS GRAFT WITH SUBFACIAL, NON-REVERSED  VEIN.;  Surgeon: Chuck Hint, MD;  Location: [facility] OR;  Service: Vascular;  Laterality: Left;   CARDIAC CATHETERIZATION N/A 02/22/2016   Procedure: Left Heart Cath and Coronary Angiography;  Surgeon: Tonny Bollman, MD;  Location: [facility] INVASIVE CV LAB;  Service: Cardiovascular;  Laterality: N/A;   CARDIAC CATHETERIZATION N/A 02/22/2016   Procedure: Coronary Stent Intervention;  Surgeon: Tonny Bollman, MD;  Location: [facility] INVASIVE CV LAB;  Service: Cardiovascular;  Laterality: N/A;   INTRAOPERATIVE ARTERIOGRAM Left 02/05/2021   Procedure: INTRA OPERATIVE ARTERIOGRAM- LEFT LOWER EXTREMITY;  Surgeon: Chuck Hint, MD;  Location: [facility] OR;  Service: Vascular;  Laterality: Left;   LOWER EXTREMITY ANGIOGRAPHY Left 01/23/2021   Procedure: Lower Extremity Angiography;  Surgeon: Leonie Douglas, MD;  Location: [facility] INVASIVE CV LAB;  Service: Cardiovascular;  Laterality:  Left;   PERCUTANEOUS CORONARY STENT INTERVENTION (PCI-S)     WRIST SURGERY       Current Outpatient Medications  Medication Sig Dispense Refill   apixaban (ELIQUIS) 5 MG TABS tablet Take 1 tablet (5 mg total) by mouth 2 (two) times daily. 60 tablet 2   clotrimazole (LOTRIMIN) 1 % cream Apply topically 2 (two) times daily. 30 g 0   nicotine (NICODERM CQ - DOSED IN MG/24 HOURS) 14 mg/24hr patch Place 1 patch (14 mg total) onto the skin daily. 28 patch 0   oxyCODONE-acetaminophen (PERCOCET) 7.5-325 MG tablet Take 1 tablet by mouth every 6 (six) hours as needed for moderate pain. (Patient not taking: Reported on 05/08/2021) 12 tablet 0   promethazine (PHENERGAN) 25 MG tablet Take 1 tablet (25 mg total) by mouth every 6 (six) hours as needed for nausea or vomiting. 60 tablet 1   rosuvastatin (CRESTOR) 20 MG tablet Take 1 tablet (20 mg total) by mouth daily. 90 tablet 3   senna-docusate (SENOKOT-S) 8.6-50 MG tablet Take 1 tablet by mouth 2 (two) times daily.     No current facility-administered medications for this visit.    Allergies:   Patient has no known allergies.    Social History:  The patient  reports that he has been smoking cigarettes. He has a 30.00 pack-year smoking history. He quit smokeless tobacco use about 2 years ago.  His smokeless tobacco use included chew. He reports current alcohol use. He reports that he does not use drugs.   Family History:  The patient's ***family history includes Hypertension in  his maternal grandfather.    ROS:  Please see the history of present illness.   Otherwise, review of systems are positive for {NONE DEFAULTED:18576}.   All other systems are reviewed and negative.    PHYSICAL EXAM: VS:  There were no vitals taken for this visit. , BMI There is no height or weight on file to calculate BMI. GENERAL:  Well appearing HEENT:  Pupils equal round and reactive, fundi not visualized, oral mucosa unremarkable NECK:  No jugular venous distention,  waveform within normal limits, carotid upstroke brisk and symmetric, no bruits, no thyromegaly LYMPHATICS:  No cervical, inguinal adenopathy LUNGS:  Clear to auscultation bilaterally BACK:  No CVA tenderness CHEST:  Unremarkable HEART:  PMI not displaced or sustained,S1 and S2 within normal limits, no S3, no S4, no clicks, no rubs, *** murmurs ABD:  Flat, positive bowel sounds normal in frequency in pitch, no bruits, no rebound, no guarding, no midline pulsatile mass, no hepatomegaly, no splenomegaly EXT:  2 plus pulses throughout, no edema, no cyanosis no clubbing SKIN:  No rashes no nodules NEURO:  Cranial nerves II through XII grossly intact, motor grossly intact throughout PSYCH:  Cognitively intact, oriented to person place and time    EKG:  EKG {ACTION; IS/IS QIO:[id number]} ordered today. The ekg ordered today demonstrates ***   Recent Labs: 03/12/2021: ALT 20 03/13/2021: BUN 9; Creatinine, Ser 0.82; Hemoglobin 12.7; Platelets 186; Potassium 3.8; Sodium 131    Lipid Panel    Component Value Date/Time   CHOL 108 02/06/2021 0215   TRIG 69 02/06/2021 0215   HDL 32 (L) 02/06/2021 0215   CHOLHDL 3.4 02/06/2021 0215   VLDL 14 02/06/2021 0215   LDLCALC 62 02/06/2021 0215      Wt Readings from Last 3 Encounters:  05/08/21 158 lb 8 oz (71.9 kg)  03/27/21 159 lb 4.8 oz (72.3 kg)  03/25/21 155 lb 12.8 oz (70.7 kg)      Other studies Reviewed: Additional studies/ records that were reviewed today include: ***. Review of the above records demonstrates:  Please see elsewhere in the note.  ***   Cardiac cath 02/22/16   Diagnostic Dominance: Right    Intervention       ASSESSMENT AND PLAN:  CAD:  ***  DYSLIPIDEMIA:  ***   Current medicines are reviewed at length with the patient today.  The patient {ACTIONS; HAS/DOES NOT HAVE:19233} concerns regarding medicines.  The following changes have been made:  {PLAN; NO CHANGE:13088:s}  Labs/ tests ordered today  include: *** No orders of the defined types were placed in this encounter.    Disposition:   FU with ***    Signed, Rollene Rotunda, MD  05/09/2021 9:18 PM    [facility]

## 2021-05-10 ENCOUNTER — Ambulatory Visit: Payer: 59 | Admitting: Cardiology

## 2021-05-16 ENCOUNTER — Ambulatory Visit (INDEPENDENT_AMBULATORY_CARE_PROVIDER_SITE_OTHER): Payer: Self-pay | Admitting: Family Medicine

## 2021-05-16 ENCOUNTER — Other Ambulatory Visit: Payer: Self-pay

## 2021-05-16 ENCOUNTER — Ambulatory Visit: Payer: Medicaid Other | Admitting: Family Medicine

## 2021-05-16 ENCOUNTER — Ambulatory Visit (HOSPITAL_COMMUNITY)
Admission: RE | Admit: 2021-05-16 | Discharge: 2021-05-16 | Disposition: A | Discharge status: Home / Self Care | Payer: Medicaid Other | Source: Ambulatory Visit | Attending: Family Medicine | Admitting: Family Medicine

## 2021-05-16 VITALS — BP 108/86 | HR 106 | Ht 66.0 in | Wt 158.0 lb

## 2021-05-16 DIAGNOSIS — R600 Localized edema: Secondary | ICD-10-CM | POA: Diagnosis not present

## 2021-05-16 DIAGNOSIS — M7989 Other specified soft tissue disorders: Secondary | ICD-10-CM | POA: Insufficient documentation

## 2021-05-16 DIAGNOSIS — D649 Anemia, unspecified: Secondary | ICD-10-CM

## 2021-05-16 DIAGNOSIS — E871 Hypo-osmolality and hyponatremia: Secondary | ICD-10-CM

## 2021-05-16 DIAGNOSIS — M79662 Pain in left lower leg: Secondary | ICD-10-CM

## 2021-05-16 DIAGNOSIS — L03116 Cellulitis of left lower limb: Secondary | ICD-10-CM

## 2021-05-16 DIAGNOSIS — D619 Aplastic anemia, unspecified: Secondary | ICD-10-CM

## 2021-05-16 DIAGNOSIS — Q8901 Asplenia (congenital): Secondary | ICD-10-CM

## 2021-05-16 DIAGNOSIS — I748 Embolism and thrombosis of other arteries: Secondary | ICD-10-CM

## 2021-05-16 MED ORDER — DOXYCYCLINE HYCLATE 100 MG PO TABS
100.0000 mg | ORAL_TABLET | Freq: Two times a day (BID) | ORAL | 0 refills | Status: DC
Start: 2021-05-16 — End: 2021-05-28

## 2021-05-16 MED ORDER — APIXABAN 5 MG PO TABS: 5.0000 mg | ORAL_TABLET | Freq: Two times a day (BID) | ORAL | 0 refills | Status: DC

## 2021-05-16 NOTE — Assessment & Plan Note (Signed)
Patient w/ h/o thrombosis presents out of eliquis x2 days. Given eliquis 5 mg BID x 7 days sample pack today in clinic. Will forward to pharmacy team to help patient obtain affordable access for eliquis.

## 2021-05-16 NOTE — Progress Notes (Signed)
    SUBJECTIVE:   CHIEF COMPLAINT / HPI:   Swelling of left foot: 45 yo man presents today with acute worsening of swelling in left foot. He had fem pop bypass in April with Dr. Edilia Bo. Seen at [facility] on 7/6 and improved at that time. He is on eliquis for h/o DVT in left leg. He reports that he ran out of eliquis 2 days ago and could not refill due to cost. He also recently wore a pair of western boots that rubbed the lower part of his incision from the bypass. Today his foot is swollen, painfull, and he is oozing pustulant drainage from distal incision site. He denies fevers, chills, SOB, CP.  PERTINENT  PMH / PSH: PAD, h/o DVT on eliquis  OBJECTIVE:   BP 108/86   Pulse (!) 106   Ht 5\' 6"  (1.676 m)   Wt 158 lb (71.7 kg)   SpO2 100%   BMI 25.50 kg/m   Nursing note and vitals reviewed GEN: appears older than stated age, WM, resting comfortably in chair, NAD, WNWD, alert and at baseline Cardiac: Regular rate and rhythm. Normal S1/S2. No murmurs, rubs, or gallops appreciated. 2+ radial pulses. Lungs: Clear bilaterally to ascultation. No increased WOB, no accessory muscle usage. No w/r/r. Ext: LLE with 55mm opening in distal incision with pustulant drainage, left foot erythematous to mid shin, toes edematous, mildly cooler to touch than R foot.  Psych: Pleasant and appropriate    ASSESSMENT/PLAN:   Pain and swelling of left lower leg Stat DVT 11m negative for DVT in LLE; results given to patient over the phone. Suspect cellulitis given erythema and pustulant drainage as primary problem. Obtained culture of drainage. Discussed return precautions over the weekend if worsening cellulitis; patient completed teachback to go to ED. Will treat with MRSA coverage. Recommend keeping leg elevated at heart level, clean and dry. Do not wear shoes that will touch incision. Earliest follow up available is Tuesday, 7/19. Will forward note to [facility]. 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 Given eliquis 5 mg BID x 7 days sample pack today in clinic. Will forward to pharmacy team to help patient obtain affordable access for eliquis.  Functional asplenia Will give TDAP, menactra, HIB, and PCP13/PPSV23 at next visit. Unable to do so today due to acuity of problem and limited time of stat [facility] options.     [place], MD [facility]

## 2021-05-16 NOTE — Assessment & Plan Note (Signed)
Stat DVT US negative for DVT in LLE; results given to patient over the phone. Suspect cellulitis given erythema and pustulant drainage as primary problem. Obtained culture of drainage. Discussed return precautions over the weekend if worsening cellulitis; patient completed teachback to go to ED. Will treat with MRSA coverage. Recommend keeping leg elevated at heart level, clean and dry. Do not wear shoes that will touch incision. Earliest follow up available is Tuesday, 7/19. Will forward note to [facility]. - Doxycycline 100 mg BID x 14d  - Follow culture results

## 2021-05-16 NOTE — Patient Instructions (Signed)
It was a pleasure to see you today!  Please go to [facility] and get ultrasound of your leg now Please take doxycycline 100 mg BID x 14 days If you have chills, fever, or notice pain spreading up your leg, please come back to see [place] sooner or go to emergency department I will get you more eliquis and have pharmacy find a way to get your affordable medication I will call you to schedule a close follow up appointment.    Be Well,  Dr. Leary Roca

## 2021-05-16 NOTE — Assessment & Plan Note (Addendum)
Will give TDAP, menactra, HIB, and PCP13/PPSV23 at next visit. Unable to do so today due to acuity of problem and limited time of stat US options.

## 2021-05-21 ENCOUNTER — Emergency Department (HOSPITAL_COMMUNITY): Payer: Medicaid Other

## 2021-05-21 ENCOUNTER — Ambulatory Visit (INDEPENDENT_AMBULATORY_CARE_PROVIDER_SITE_OTHER): Payer: Self-pay | Admitting: Family Medicine

## 2021-05-21 ENCOUNTER — Inpatient Hospital Stay (HOSPITAL_COMMUNITY)
Admission: EM | Admit: 2021-05-21 | Discharge: 2021-05-28 | DRG: 240 | Disposition: A | Discharge status: Home Health | Payer: Medicaid Other | Attending: Family Medicine | Admitting: Family Medicine

## 2021-05-21 ENCOUNTER — Encounter (HOSPITAL_COMMUNITY): Payer: Medicaid Other

## 2021-05-21 ENCOUNTER — Other Ambulatory Visit: Payer: Self-pay

## 2021-05-21 ENCOUNTER — Encounter (HOSPITAL_COMMUNITY): Payer: Self-pay | Admitting: Emergency Medicine

## 2021-05-21 VITALS — Ht 66.0 in | Wt 154.8 lb

## 2021-05-21 DIAGNOSIS — Z7901 Long term (current) use of anticoagulants: Secondary | ICD-10-CM

## 2021-05-21 DIAGNOSIS — G546 Phantom limb syndrome with pain: Secondary | ICD-10-CM | POA: Diagnosis present

## 2021-05-21 DIAGNOSIS — L03116 Cellulitis of left lower limb: Secondary | ICD-10-CM

## 2021-05-21 DIAGNOSIS — Q8901 Asplenia (congenital): Secondary | ICD-10-CM

## 2021-05-21 DIAGNOSIS — E785 Hyperlipidemia, unspecified: Secondary | ICD-10-CM | POA: Diagnosis present

## 2021-05-21 DIAGNOSIS — Z955 Presence of coronary angioplasty implant and graft: Secondary | ICD-10-CM | POA: Diagnosis not present

## 2021-05-21 DIAGNOSIS — B351 Tinea unguium: Secondary | ICD-10-CM | POA: Diagnosis present

## 2021-05-21 DIAGNOSIS — E876 Hypokalemia: Secondary | ICD-10-CM | POA: Diagnosis present

## 2021-05-21 DIAGNOSIS — Z95820 Peripheral vascular angioplasty status with implants and grafts: Secondary | ICD-10-CM

## 2021-05-21 DIAGNOSIS — I748 Embolism and thrombosis of other arteries: Secondary | ICD-10-CM | POA: Diagnosis present

## 2021-05-21 DIAGNOSIS — Z8249 Family history of ischemic heart disease and other diseases of the circulatory system: Secondary | ICD-10-CM

## 2021-05-21 DIAGNOSIS — I70262 Atherosclerosis of native arteries of extremities with gangrene, left leg: Principal | ICD-10-CM | POA: Diagnosis present

## 2021-05-21 DIAGNOSIS — Z20822 Contact with and (suspected) exposure to covid-19: Secondary | ICD-10-CM | POA: Diagnosis present

## 2021-05-21 DIAGNOSIS — Z79899 Other long term (current) drug therapy: Secondary | ICD-10-CM

## 2021-05-21 DIAGNOSIS — I2510 Atherosclerotic heart disease of native coronary artery without angina pectoris: Secondary | ICD-10-CM | POA: Diagnosis present

## 2021-05-21 DIAGNOSIS — E871 Hypo-osmolality and hyponatremia: Secondary | ICD-10-CM | POA: Diagnosis not present

## 2021-05-21 DIAGNOSIS — I998 Other disorder of circulatory system: Secondary | ICD-10-CM

## 2021-05-21 DIAGNOSIS — I70222 Atherosclerosis of native arteries of extremities with rest pain, left leg: Secondary | ICD-10-CM | POA: Diagnosis present

## 2021-05-21 DIAGNOSIS — M7989 Other specified soft tissue disorders: Secondary | ICD-10-CM

## 2021-05-21 DIAGNOSIS — F17210 Nicotine dependence, cigarettes, uncomplicated: Secondary | ICD-10-CM | POA: Diagnosis present

## 2021-05-21 DIAGNOSIS — Z23 Encounter for immunization: Secondary | ICD-10-CM

## 2021-05-21 DIAGNOSIS — M79662 Pain in left lower leg: Secondary | ICD-10-CM

## 2021-05-21 DIAGNOSIS — I739 Peripheral vascular disease, unspecified: Secondary | ICD-10-CM | POA: Diagnosis present

## 2021-05-21 DIAGNOSIS — I70362 Atherosclerosis of unspecified type of bypass graft(s) of the extremities with gangrene, left leg: Secondary | ICD-10-CM | POA: Diagnosis present

## 2021-05-21 DIAGNOSIS — D730 Hyposplenism: Secondary | ICD-10-CM

## 2021-05-21 DIAGNOSIS — F17200 Nicotine dependence, unspecified, uncomplicated: Secondary | ICD-10-CM | POA: Diagnosis present

## 2021-05-21 DIAGNOSIS — I252 Old myocardial infarction: Secondary | ICD-10-CM

## 2021-05-21 HISTORY — DX: Acute embolism and thrombosis of other specified veins: I82.890

## 2021-05-21 HISTORY — DX: Peripheral vascular disease, unspecified: I73.9

## 2021-05-21 HISTORY — DX: Other disorder of circulatory system: I99.8

## 2021-05-21 LAB — COMPREHENSIVE METABOLIC PANEL
ALT: 25 U/L (ref 0–44)
AST: 19 U/L (ref 15–41)
Albumin: 4 g/dL (ref 3.5–5.0)
Alkaline Phosphatase: 107 U/L (ref 38–126)
Anion gap: 11 (ref 5–15)
BUN: 10 mg/dL (ref 6–20)
CO2: 25 mmol/L (ref 22–32)
Calcium: 9.9 mg/dL (ref 8.9–10.3)
Chloride: 100 mmol/L (ref 98–111)
Creatinine, Ser: 0.83 mg/dL (ref 0.61–1.24)
GFR, Estimated: >60 mL/min (ref >60)
Glucose, Bld: 112 mg/dL — ABNORMAL HIGH (ref 70–99)
Potassium: 4.5 mmol/L (ref 3.5–5.1)
Sodium: 136 mmol/L (ref 135–145)
Total Bilirubin: 0.7 mg/dL (ref 0.3–1.2)
Total Protein: 8.3 g/dL — ABNORMAL HIGH (ref 6.5–8.1)

## 2021-05-21 LAB — CBC WITH DIFFERENTIAL/PLATELET
Abs Immature Granulocytes: 0.03 10*3/uL (ref 0.00–0.07)
Basophils Absolute: 0.2 10*3/uL — ABNORMAL HIGH (ref 0.0–0.1)
Basophils Relative: 1 %
Eosinophils Absolute: 0.2 10*3/uL (ref 0.0–0.5)
Eosinophils Relative: 1 %
HCT: 51.5 % (ref 39.0–52.0)
Hemoglobin: 16.9 g/dL (ref 13.0–17.0)
Immature Granulocytes: 0 %
Lymphocytes Relative: 35 %
Lymphs Abs: 4 10*3/uL (ref 0.7–4.0)
MCH: 29.3 pg (ref 26.0–34.0)
MCHC: 32.8 g/dL (ref 30.0–36.0)
MCV: 89.3 fL (ref 80.0–100.0)
Monocytes Absolute: 1 10*3/uL (ref 0.1–1.0)
Monocytes Relative: 9 %
Neutro Abs: 6 10*3/uL (ref 1.7–7.7)
Neutrophils Relative %: 54 %
Platelets: 295 10*3/uL (ref 150–400)
RBC: 5.77 MIL/uL (ref 4.22–5.81)
RDW: 15.8 % — ABNORMAL HIGH (ref 11.5–15.5)
WBC: 11.3 10*3/uL — ABNORMAL HIGH (ref 4.0–10.5)
nRBC: 0 % (ref 0.0–0.2)

## 2021-05-21 LAB — RESP PANEL BY RT-PCR (FLU A&B, COVID) ARPGX2
Influenza A by PCR: NEGATIVE
Influenza B by PCR: NEGATIVE
SARS Coronavirus 2 by RT PCR: NEGATIVE

## 2021-05-21 LAB — WOUND CULTURE

## 2021-05-21 LAB — PROTIME-INR
INR: 1.1 (ref 0.8–1.2)
Prothrombin Time: 14.3 seconds (ref 11.4–15.2)

## 2021-05-21 LAB — LACTIC ACID, PLASMA: Lactic Acid, Venous: 1.5 mmol/L (ref 0.5–1.9)

## 2021-05-21 MED ORDER — HEPARIN (PORCINE) 25000 UT/250ML-% IV SOLN
1500.0000 [IU]/h | INTRAVENOUS | Status: DC
  Administered 2021-05-21: 1100 [IU]/h via INTRAVENOUS
  Administered 2021-05-22: 1500 [IU]/h via INTRAVENOUS
  Filled 2021-05-21 (×2): qty 250

## 2021-05-21 MED ORDER — ROSUVASTATIN CALCIUM 20 MG PO TABS
20.0000 mg | ORAL_TABLET | Freq: Every day | ORAL | Status: DC
  Administered 2021-05-22 – 2021-05-28 (×7): 20 mg via ORAL
  Filled 2021-05-21 (×7): qty 1

## 2021-05-21 MED ORDER — PIPERACILLIN-TAZOBACTAM 3.375 G IVPB 30 MIN
3.3750 g | Freq: Once | INTRAVENOUS | Status: AC
  Administered 2021-05-21: 3.375 g via INTRAVENOUS
  Filled 2021-05-21: qty 50

## 2021-05-21 MED ORDER — APIXABAN 5 MG PO TABS: 5.0000 mg | ORAL_TABLET | Freq: Two times a day (BID) | ORAL | 0 refills | Status: DC

## 2021-05-21 MED ORDER — OXYCODONE HCL 5 MG PO TABS
5.0000 mg | ORAL_TABLET | Freq: Four times a day (QID) | ORAL | Status: DC | PRN
  Administered 2021-05-21 – 2021-05-24 (×6): 5 mg via ORAL
  Filled 2021-05-21 (×6): qty 1

## 2021-05-21 MED ORDER — SENNOSIDES-DOCUSATE SODIUM 8.6-50 MG PO TABS
1.0000 | ORAL_TABLET | Freq: Two times a day (BID) | ORAL | Status: DC
  Administered 2021-05-21 – 2021-05-28 (×12): 1 via ORAL
  Filled 2021-05-21 (×13): qty 1

## 2021-05-21 MED ORDER — APIXABAN 5 MG PO TABS
5.0000 mg | ORAL_TABLET | Freq: Two times a day (BID) | ORAL | 2 refills | Status: DC
  Filled 2021-05-21: qty 60, 30d supply, fill #0

## 2021-05-21 MED ORDER — ACETAMINOPHEN 500 MG PO TABS
1000.0000 mg | ORAL_TABLET | Freq: Four times a day (QID) | ORAL | Status: DC
  Administered 2021-05-21 – 2021-05-28 (×22): 1000 mg via ORAL
  Filled 2021-05-21 (×22): qty 2

## 2021-05-21 MED ORDER — NICOTINE 14 MG/24HR TD PT24
14.0000 mg | MEDICATED_PATCH | Freq: Every day | TRANSDERMAL | Status: DC
  Administered 2021-05-21 – 2021-05-23 (×3): 14 mg via TRANSDERMAL
  Filled 2021-05-21 (×3): qty 1

## 2021-05-21 NOTE — ED Provider Notes (Signed)
[facility] EMERGENCY DEPARTMENT Provider Note   CSN: [id number] Arrival date & time: 05/21/21  1502     History Chief Complaint  Patient presents with   Foot Infection    Evan Rodriguez is a 45 y.o. male.  The history is provided by the patient and medical records.  Evan Rodriguez is a 45 y.o. male who presents to the Emergency Department complaining of foot pain.  He presents to the ED complaining of five days of progressive left foot pain and redness. He has a history of PAD with left femoral to peroneal artery bypass in 2020. He is compliant with his eloquence. He was started on antibiotics five days ago for concern for possible infection. He was in the office today and referred to the emergency department due to worsening pain and absent pulse. Patient denies fevers, chest pain, shortness of breath, nausea, vomiting. Pain extends from his foot to the mid calf.    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   Coronary artery disease involving native coronary artery of native heart without angina pectoris 05/09/2021   Onychomycosis of toenail 03/25/2021   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   PAD (peripheral artery disease) (HCC) 02/05/2021   Critical lower limb ischemia (HCC)    Tobacco dependence 01/22/2021   Vaping nicotine dependence, non-tobacco product 01/22/2021   Skin ulcer of left foot including toes (HCC) 01/22/2021   Maceration of skin 01/22/2021   Tinea pedis 01/05/2021   Dyslipidemia (high LDL; low HDL)  02/23/2016   History of non-ST elevation myocardial infarction (NSTEMI) 02/22/2016    Past Surgical History:  Procedure Laterality Date   ABDOMINAL AORTOGRAM N/A 01/23/2021   Procedure: ABDOMINAL AORTOGRAM;  Surgeon: Leonie Douglas, MD;  Location: [facility] INVASIVE CV LAB;  Service: Cardiovascular;  Laterality: N/A;   BYPASS GRAFT FEMORAL-PERONEAL Left 02/05/2021   Procedure: LEFT SUPERFICIAL FEMORAL-PERONEAL ARTERY BYPASS GRAFT WITH SUBFACIAL, NON-REVERSED  VEIN.;  Surgeon: Chuck Hint, MD;  Location: [facility] OR;  Service: Vascular;  Laterality: Left;   CARDIAC CATHETERIZATION N/A 02/22/2016   Procedure: Left Heart Cath and Coronary Angiography;  Surgeon: Tonny Bollman, MD;  Location: [facility] INVASIVE CV LAB;  Service: Cardiovascular;  Laterality: N/A;   CARDIAC CATHETERIZATION N/A 02/22/2016   Procedure: Coronary Stent Intervention;  Surgeon: Tonny Bollman, MD;  Location: [facility] INVASIVE CV LAB;  Service: Cardiovascular;  Laterality: N/A;   INTRAOPERATIVE ARTERIOGRAM Left 02/05/2021   Procedure: INTRA OPERATIVE ARTERIOGRAM- LEFT LOWER EXTREMITY;  Surgeon: Chuck Hint, MD;  Location: [facility] OR;  Service: Vascular;  Laterality: Left;   LOWER EXTREMITY ANGIOGRAPHY Left 01/23/2021   Procedure: Lower Extremity Angiography;  Surgeon: Leonie Douglas, MD;  Location: [facility] INVASIVE CV LAB;  Service: Cardiovascular;  Laterality: Left;   PERCUTANEOUS CORONARY STENT INTERVENTION (PCI-S)     WRIST SURGERY         Family History  Problem Relation Age of Onset   Hypertension Maternal Grandfather     Social History   Tobacco Use   Smoking status: Every  Day    Packs/day: 1.50    Years: 20.00    Pack years: 30.00    Types: Cigarettes   Smokeless tobacco: Former    Types: Chew    Quit date: 02/05/2019   Tobacco comments:    quit chew tobacco in 2020  Vaping Use   Vaping Use: Some days   Devices: Salt Base; refillable.   Substance Use Topics   Alcohol use: Yes    Comment: occasionally/monthly    Drug use: No    Home Medications Prior to Admission medications   Medication Sig Start Date End Date Taking? Authorizing Provider  apixaban (ELIQUIS) 5 MG TABS tablet Take 1 tablet (5 mg total) by mouth 2 (two) times daily. 05/21/21   Brimage, Seward Meth, DO  apixaban (ELIQUIS) 5 MG TABS tablet Take 1 tablet (5 mg total) by mouth 2 (two) times daily. 05/21/21   Katha Cabal, DO  clotrimazole (LOTRIMIN) 1 % cream Apply topically 2 (two) times daily. 01/24/21   Towanda Octave, MD  doxycycline (VIBRA-TABS) 100 MG tablet Take 1 tablet (100 mg total) by mouth 2 (two) times daily. 05/16/21   Shirlean Mylar, MD  nicotine (NICODERM CQ - DOSED IN MG/24 HOURS) 14 mg/24hr patch Place 1 patch (14 mg total) onto the skin daily. 03/16/21   Danford, Earl Lites, MD  promethazine (PHENERGAN) 25 MG tablet Take 1 tablet (25 mg total) by mouth every 6 (six) hours as needed for nausea or vomiting. 03/15/21   Danford, Earl Lites, MD  rosuvastatin (CRESTOR) 20 MG tablet Take 1 tablet (20 mg total) by mouth daily. 03/15/21   Danford, Earl Lites, MD  senna-docusate (SENOKOT-S) 8.6-50 MG tablet Take 1 tablet by mouth 2 (two) times daily. 03/15/21   Danford, Earl Lites, MD    Allergies    Patient has no known allergies.  Review of Systems   Review of Systems  All other systems reviewed and are negative.  Physical Exam Updated Vital Signs BP 117/84 (BP Location: Right Arm)   Pulse 97   Temp (!) 96.9 F (36.1 C) (Oral)   Resp 20   Ht 5\' 6"  (1.676 m)   Wt 70 kg   SpO2 98%   BMI 24.91 kg/m   Physical Exam Vitals and nursing note reviewed.  Constitutional:      Appearance: He is well-developed.  HENT:     Head: Normocephalic and atraumatic.  Cardiovascular:     Rate and Rhythm: Normal rate and regular rhythm.     Heart sounds: No murmur heard. Pulmonary:     Effort: Pulmonary effort is normal. No respiratory distress.     Breath sounds: Normal breath sounds.  Abdominal:     Palpations: Abdomen  is soft.     Tenderness: There is no abdominal tenderness. There is no guarding or rebound.  Musculoskeletal:     Comments: Tenderness to palpation throughout the left mid and distal foot. There is mild soft tissue swelling throughout the left foot. The digits to the left foot are cool to touch with delayed cap refill. Unable to palpate or Doppler DP or PT pulse on the left.  Skin:    General: Skin is warm and dry.  Neurological:     Mental Status: He is alert and oriented to person, place, and time.  Psychiatric:        Behavior: Behavior normal.     ED Results / Procedures / Treatments   Labs (all labs ordered are listed, but only abnormal results are  displayed) Labs Reviewed  COMPREHENSIVE METABOLIC PANEL - Abnormal; Notable for the following components:      Result Value   Glucose, Bld 112 (*)    Total Protein 8.3 (*)    All other components within normal limits  CBC WITH DIFFERENTIAL/PLATELET - Abnormal; Notable for the following components:   WBC 11.3 (*)    RDW 15.8 (*)    Basophils Absolute 0.2 (*)    All other components within normal limits  RESP PANEL BY RT-PCR (FLU A&B, COVID) ARPGX2  PROTIME-INR  LACTIC ACID, PLASMA  LACTIC ACID, PLASMA    EKG None  Radiology DG Foot Complete Left  Result Date: 05/21/2021 CLINICAL DATA:  Left foot redness and swelling EXAM: LEFT FOOT - COMPLETE 3+ VIEW COMPARISON:  01/21/2021 FINDINGS: No fracture or malalignment. Interval heterogeneous lucencies and mineralization throughout the foot, suspect related to osteopenia. No frank osseous destructive change or periostitis. No soft tissue emphysema. IMPRESSION: 1. Interval heterogeneous lucency and mineralization throughout the foot bones, potentially due to osteopenia. No definitive osseous destructive change. MRI follow-up as indicated. Electronically Signed   By: Jasmine Pang M.D.   On: 05/21/2021 19:18    Procedures Procedures   Medications Ordered in ED Medications - No data  to display  ED Course  I have reviewed the triage vital signs and the nursing notes.  Pertinent labs & imaging results that were available during my care of the patient were reviewed by me and considered in my medical decision making (see chart for details).  Clinical Course as of 05/21/21 2311  Tue May 21, 2021  [id number] Still awaiting Vascular consult, have paged x 2.  [RS]    Clinical Course User Index [RS] Sponseller, Eugene Gavia, PA-C   MDM Rules/Calculators/A&P                          Pt with hx/o PAD s/p femoral peroneal artery bypass as well as recent splenic infarction on eliquis here for worsening redness/pain to left foot for last five days.  Absent pedal pulse in department.  Foot is warm, toes are cool with delayed cap refill.  D/w Dr. Edilia Bo with vascular surgery - recommends ABI and graft duplex.  Pt started on abx for cellulitis, heparin for possible ischemia.  Family medicine consulted for ongoing treatment.    Final Clinical Impression(s) / ED Diagnoses Final diagnoses:  None    Rx / DC Orders ED Discharge Orders     None        Tilden Fossa, MD 05/21/21 2314

## 2021-05-21 NOTE — ED Triage Notes (Signed)
Pt sent here by PCP for possible osteomyelitis of the L foot. Pt had vascular surgery to remove a DVT in L leg in April. 5 days ago noticed redness, swelling to L foot, has progressively gotten worse. PCP was unable to find a pulse, sent pt here.

## 2021-05-21 NOTE — ED Provider Notes (Signed)
Emergency Medicine Provider Triage Evaluation Note  Evan Rodriguez , a 45 y.o. male  was evaluated in triage.  Pt complains of left foot pain and skin changes x5 days.  Patient with history of peripheral artery disease with the left superficial femoral to peroneal artery bypass in April 2020 secondary to critical ischemia with gangrene of the foot.  Patient has been anticoagulated with Eliquis since that time.  Presents today with concern for 5 days progressive worsening pain, skin changes, weeping, cool to the touch, and decree sensation of the toes.  Seen by PCP this morning with concern for gangrene and possible osteomyelitis.  Patient denies fevers, chills, nausea, vomiting  Review of Systems  Positive: Discoloration of the foot, weeping or chills, swelling of the foot, cool to the touch, pain in the foot Negative: Nausea, vomiting, fevers, chills  Physical Exam  BP 116/89   Pulse (!) 109   Temp 98.1 F (36.7 C)   Resp (!) 22   Ht 5\' 6"  (1.676 m)   Wt 70 kg   SpO2 99%   BMI 24.91 kg/m  Gen:   Awake, no distress   Resp:  Normal effort  MSK:   Moves extremities without difficulty  Other:  Ischemic appearing left foot with purple discoloration of the distal toes, edema of the distal foot and weeping of the skin.  There is also erythema.  Skin is cold to the touch, unable to palpate or Doppler left DP or TP pulses.  Medical Decision Making  Medically screening exam initiated at 6:41 PM.  Appropriate orders placed.  Evan Rodriguez was informed that the remainder of the evaluation will be completed by another provider, this initial triage assessment does not replace that evaluation, and the importance of remaining in the ED until their evaluation is complete.  Significant concern for ischemic foot given history and abnormal physical exam findings today.  Stat arterial Doppler of the left lower extremity placed, RN did communicate directly with vascular ultrasonographer to prioritize study.   Additionally consult to vascular surgery was placed.  Basic laboratory studies including PT/INR were ordered.  We will hold off on ordering heparin pending INR given patient is anticoagulated on Eliquis.  Attending physician made aware as well.  Awaiting vascular consult at this time.   This chart was dictated using voice recognition software, Dragon. Despite the best efforts of this provider to proofread and correct errors, errors may still occur which can change documentation meaning.    Glendora Score 05/21/21 1925    Tegeler, 05/23/21, MD 05/21/21 [phone]

## 2021-05-21 NOTE — H&P (Addendum)
Family Medicine Teaching [facility] Admission History and Physical Service Pager: [phone]  Patient name: Evan Rodriguez Medical record number: [id number] Date of birth: 1976/05/11 Age: 45 y.o. Gender: male  Primary Care Provider: Reece Leader, DO Consultants: [facility]VS Code Status: FULL Preferred Emergency Contact:  Contact Information     Name Relation Home Work Mobile   Rodriguez,Evan Significant other   [phone]        Chief Complaint: left foot pain   Assessment and Plan: Evan Rodriguez is a 45 y.o. male presenting with left foot pain . PMH is significant for PAD, MI, CAD, tinea pedis and splenic artery thrombosis  Left foot cellulitis Left limb ischemia Evan Rodriguez is a 45 y.o. male who presented with 5 day history of worsening left foot pain, edema and erythema.  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 S/p left SFA to peroneal artery bypass with a vein graft in 02/05/21.  Patient treated for left foot cellulitis as an outpatient 5 days ago with doxycycline.  Seen at [facility] clinic today and sent to the ED due to concern for acute limb ischemia. Patient describes the left foot pain as "spasm and throbbing". Patient rates the pain 10/10 and reports pain is worse with walking. Oxycodone states the edge of the pain. Patient denies any fever or chills but reports occasional purulent drainage of the big toe.  Labs on admission: WBC 11.3, glucose 112, LA 1.5. On exam: erythematous and edematous left foot, cool on palpation compared to right foot, significant TTP especially over left great toe. Absence of dorsalis pedis and posterior tibial pulses of left foot.  The absence of these pulses were confirmed by bed side doppler.  Wound culture from 6 days ago was positive for Staph Aureus.  Discussed with Dr. Edilia Bo, VVS this evening who suspects peroneal artery bypass graft is occluded.  Patient previously had a brisk peroneal signal  with Doppler however it is now dampened.  Will await duplex graft.  If patent we will continue antibiotics, if occluded no further options for revascularization and would need BKA.  Patient could potentially could have amputation later this week after he has been off Eliquis for 48 hours.  No need for patient to be n.p.o.  Will admit for further work-up -Admitted to FPTS, med surg, Dr. 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 Home meds include Eliquis 5 mg twice daily.  Patient reports compliance with Eliquis. TDAP, HIB given in clinic today.  Pneumococcal vaccine was not available at this time. -Hold Eliquis given likely need for surgery in the next few days  -Heparin gtt as above -Pneumococcal vaccine as outpatient  Previous NSTEMI 02/2016 s/p PCI Denies current chest pain. Left heart cath in 2017 showing several two vessel CAD, PCI placed in Lcx/LAD/diagonal. S/p DAPT (ASA and Brillinta) x 12 months. Home meds without ASA. -Continue to monitor -Clarify need for ASA  HLD  Most recent lipid panel: 02/06/21, cholesterol 108, HDL 38, LDL 62, TG  69, VLDL 14. LDL goal<70. Home meds: rosuvastatin 20 mg -Continue rosuvastatin  20 mg  Chronic Tobacco use Patient reports he smokes 1.5 packs a day and have been trying to cut it down to half a pack a day. He said he has been unsuccessful with his attempt. Patient has a history of PAD which can be compounded with his smoking history -Smoking cessation seen as outpatient -Nicoderm 14mg  patch daily  FEN/GI: heart  healthy diet Prophylaxis: Heparin gtt  Disposition: med surg   History of Present Illness:  Evan Rodriguez is a 45 y.o. male presenting with left foot pain  Pt reports left foot issues in March. He reports 5 day hx of right sided foot pain, swelling and erythema. Treated with 5 days doxycyline and Abx ointment no improvement in symptoms. Denies fevers. He has been out of work since March and is very upset about this. Pt reports the pain is a spasm and throbbing pain. Drainage of pus on big toe. Pain worse on walking. Reduced sensation in left foot since March. Pain slightly improved by oxycodone at home, severity 10/10. Denies hx of cellulitis. Uses walker, wheelchair PRN at home. Compliant with eliquis.   Smokes 1.5 packets cigarettes a day. Denies illicit drug use.  Drinks ETOH on special occasions. Denies covid vaccines.  Lives with daughter, son and son in law.   Review Of Systems: Per HPI with the following additions:    Review of Systems  Constitutional:  Negative for chills and fever.  Respiratory:  Negative for cough, chest tightness and shortness of breath.   Cardiovascular:  Negative for chest pain and palpitations.  Gastrointestinal:  Negative for abdominal pain, nausea and vomiting.  Skin:  Positive for color change (redness of left foot).  Psychiatric/Behavioral:  Positive for agitation. Negative for confusion.     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   PAD (peripheral artery disease) (HCC) 02/05/2021   Critical lower limb ischemia (HCC)    Tobacco dependence 01/22/2021   Vaping nicotine dependence, non-tobacco product  01/22/2021   Skin ulcer of left foot including toes (HCC) 01/22/2021   Maceration of skin 01/22/2021   Tinea pedis 01/05/2021   Dyslipidemia (high LDL; low HDL) 02/23/2016   History of non-ST elevation myocardial infarction (NSTEMI) 02/22/2016    Past Medical History: Past Medical History:  Diagnosis Date   Abdominal pain 03/12/2021   Coronary artery disease    COVID-19    Elevated sed rate    Hypokalemia 01/22/2021   Hyponatremia 03/12/2021   NSTEMI (non-ST elevated myocardial infarction) (HCC) 02/22/2016   Stented   Vaping nicotine dependence, non-tobacco product 01/22/2021    Past Surgical History: Past Surgical History:  Procedure Laterality Date   ABDOMINAL AORTOGRAM N/A 01/23/2021   Procedure: ABDOMINAL AORTOGRAM;  Surgeon: 01/25/2021, MD;  Location: [facility] INVASIVE CV LAB;  Service: Cardiovascular;  Laterality: N/A;   BYPASS GRAFT FEMORAL-PERONEAL Left 02/05/2021   Procedure: LEFT SUPERFICIAL FEMORAL-PERONEAL ARTERY BYPASS GRAFT WITH SUBFACIAL, NON-REVERSED  VEIN.;  Surgeon: 04/07/2021, MD;  Location: [facility] OR;  Service: Vascular;  Laterality: Left;   CARDIAC CATHETERIZATION N/A 02/22/2016   Procedure: Left Heart Cath and Coronary Angiography;  Surgeon: 02/24/2016, MD;  Location: [facility] INVASIVE  CV LAB;  Service: Cardiovascular;  Laterality: N/A;   CARDIAC CATHETERIZATION N/A 02/22/2016   Procedure: Coronary Stent Intervention;  Surgeon: Tonny BollmanMichael Cooper, MD;  [facility];  Service: Cardiovascular;  Laterality: N/A;   INTRAOPERATIVE ARTERIOGRAM Left 02/05/2021   Procedure: INTRA OPERATIVE ARTERIOGRAM- LEFT LOWER EXTREMITY;  Surgeon: Chuck Hintickson, Christopher S, MD;  [facility];  Service: Vascular;  Laterality: Left;   LOWER EXTREMITY ANGIOGRAPHY Left 01/23/2021   Procedure: Lower Extremity Angiography;  Surgeon: Leonie DouglasHawken, Thomas N, MD;  [facility];  Service: Cardiovascular;  Laterality: Left;   PERCUTANEOUS CORONARY STENT INTERVENTION (PCI-S)      WRIST SURGERY      Social History: Social History   Tobacco Use   Smoking status: Every Day    Packs/day: 1.50    Years: 20.00    Pack years: 30.00    Types: Cigarettes   Smokeless tobacco: Former    Types: Chew    Quit date: 02/05/2019   Tobacco comments:    quit chew tobacco in 2020  Vaping Use   Vaping Use: Some days   Devices: Salt Base; refillable.   Substance Use Topics   Alcohol use: Yes    Comment: occasionally/monthly   Drug use: No    Please also refer to relevant sections of EMR.  Family History: Family History  Problem Relation Age of Onset   Hypertension Maternal Grandfather     Allergies and Medications: No Known Allergies No current facility-administered medications on file prior to encounter.   Current Outpatient Medications on File Prior to Encounter  Medication Sig Dispense Refill   apixaban (ELIQUIS) 5 MG TABS tablet Take 1 tablet (5 mg total) by mouth 2 (two) times daily. 14 tablet 0   apixaban (ELIQUIS) 5 MG TABS tablet Take 1 tablet (5 mg total) by mouth 2 (two) times daily. 60 tablet 2   clotrimazole (LOTRIMIN) 1 % cream Apply topically 2 (two) times daily. 30 g 0   doxycycline (VIBRA-TABS) 100 MG tablet Take 1 tablet (100 mg total) by mouth 2 (two) times daily. 28 tablet 0   nicotine (NICODERM CQ - DOSED IN MG/24 HOURS) 14 mg/24hr patch Place 1 patch (14 mg total) onto the skin daily. 28 patch 0   promethazine (PHENERGAN) 25 MG tablet Take 1 tablet (25 mg total) by mouth every 6 (six) hours as needed for nausea or vomiting. 60 tablet 1   rosuvastatin (CRESTOR) 20 MG tablet Take 1 tablet (20 mg total) by mouth daily. 90 tablet 3   senna-docusate (SENOKOT-S) 8.6-50 MG tablet Take 1 tablet by mouth 2 (two) times daily.      Objective: BP 100/74 (BP Location: Right Arm)   Pulse 89   Temp (!) 96.9 F (36.1 C) (Oral)   Resp 20   Ht 5\' 6"  (1.676 m)   Wt 70 kg   SpO2 95%   BMI 24.91 kg/m   Exam: General: Well developed, appears states  age, alert, agitated and angry at times Neck: Supple, no thyromegaly, no lymphadenopathy  Cardiovascular: RRR, Normal S1/S2, No murmurs Respiratory: Normal breath sounds, no wheezing or crackles Gastrointestinal: No abdominal distention and tenderness MSK: No obvious joint deformities. Left foot has edema, cool to touch compared to right foot. It is tender to touch with no active visible drainage. Absence of palpable dorsalis pedis and posterior tibial pulses of left foot which was confirmed by bedside Doppler.  Right-sided peripheral pulses present and confirmed by bedside Doppler Derm: positive for  healing wound on the left shin  Neuro: Oriented X4, CN II-XII intact Psych: appears agitated         Labs and Imaging: CBC BMET  Recent Labs  Lab 05/21/21 1830  WBC 11.3*  HGB 16.9  HCT 51.5  PLT 295   Recent Labs  Lab 05/21/21 1830  NA 136  K 4.5  CL 100  CO2 25  BUN 10  CREATININE 0.83  GLUCOSE 112*  CALCIUM 9.9     EKG: ordered and pending  Jerre Simon, MD 05/22/2021, 1:15 AM PGY-1, [facility] Family Medicine FPTS Intern pager: [phone], text pages welcome   FPTS Upper-Level Resident Addendum   I have independently interviewed and examined the patient. I have discussed the above with the original author and agree with their documentation. My edits for correction/addition/clarification are in black. Please see also any attending notes.   Towanda Octave MD PGY-2, [facility] Family Medicine 05/22/2021 1:26 AM  FPTS Service pager: [phone] (text pages welcome through [facility])

## 2021-05-21 NOTE — Patient Instructions (Signed)
Thank you for coming in today. Unfortanately, you are still having foot pain. As discussed, it was difficult finding a pulse in your foot today. I worry about the blood flow of your leg. I recommended you go to the ED for evaluation and imaging of your leg.    Take care,   [facility]

## 2021-05-21 NOTE — Progress Notes (Addendum)
Spoke with Dr Edilia Bo, VVS surgeon this evening regarding Amin Fornwalt and acute limb ischemia. I explained that his left foot is warm to touch, however cooler than the right foot. We could not hear pulses with the bedside doppler on the left LE. He explained pt has had a peroneal bypass graft in April 2022. If the graft is patent we should continue supportive management for the cellulitis. If the graft is not patent he may have to proceed to amputation. There is no OR space available tomorrow so surgery is unlikely to be tomorrow. Pt does not need to be NPO or need MRI foot. He will try and see the patient tonight. Appreciate input from VVS.  Towanda Octave MD [facility] Family Medicine  PGY-3

## 2021-05-21 NOTE — ED Notes (Signed)
RN and PA unable to find pulse in L foot w/ doppler.

## 2021-05-21 NOTE — Progress Notes (Signed)
ANTICOAGULATION CONSULT NOTE - Initial Consult  Pharmacy Consult for heparin Indication: Ischemic limb  No Known Allergies  Patient Measurements: Height: 5\' 6"  (167.6 cm) Weight: 70 kg (154 lb 5.2 oz) IBW/kg (Calculated) : 63.8 Heparin Dosing Weight: TBW  Vital Signs: Temp: 96.9 F (36.1 C) (07/19 1921) Temp Source: Oral (07/19 1921) BP: 117/80 (07/19 2130) Pulse Rate: 93 (07/19 2130)  Labs: Recent Labs    05/21/21 1830  HGB 16.9  HCT 51.5  PLT 295  LABPROT 14.3  INR 1.1  CREATININE 0.83    Estimated Creatinine Clearance: 99.3 mL/min (by C-G formula based on SCr of 0.83 mg/dL).   Medical History: Past Medical History:  Diagnosis Date   Abdominal pain 03/12/2021   Coronary artery disease    COVID-19    Elevated sed rate    Hypokalemia 01/22/2021   Hyponatremia 03/12/2021   NSTEMI (non-ST elevated myocardial infarction) (HCC) 02/22/2016   Stented   Vaping nicotine dependence, non-tobacco product 01/22/2021    Assessment: 45 YOM presenting with foot pain/skin changes with hx of PAD s/p bypass, on Eliquis PTA for hx splenic artery thrombosis, last dose this AM.  CBC wnl  Goal of Therapy:  Heparin level 0.3-0.7 units/ml aPTT 66-102 seconds Monitor platelets by anticoagulation protocol: Yes   Plan:  Heparin gtt at 1100 units/hr, no bolus F/u 6 hour aPTT/HL with AM labs F/u VVS plans  01/24/2021, PharmD Clinical Pharmacist ED Pharmacist Phone # [phone] 05/21/2021 10:14 PM

## 2021-05-21 NOTE — Progress Notes (Signed)
   SUBJECTIVE:   CHIEF COMPLAINT / HPI:   Chief Complaint  Patient presents with   Leg Swelling     Evan Rodriguez is a 45 y.o. male here for cellulitis follow up. Pt started on doxycycline for cellulitis last week.  Pt has completed 5 days of treatment. Reports  no leg pain but ongoing foot pain. Denies fevers, increased leg swelling. Has noticed fluid draining for toes. Applied topical antibiotics this morning. Is still not able to wear shoes due to ongoing foot pain.   He is s/p femoral-popliteal bypass surgery in April 2022. He previously ran out of Eliquis due to cost. Samples given at last visit. He is  taking Eliquis for splenic artery thrombus. He has only 2 days left.    PERTINENT  PMH / PSH: reviewed and updated as appropriate   OBJECTIVE:   Ht 5\' 6"  (1.676 m)   Wt 154 lb 12.8 oz (70.2 kg)   BMI 24.99 kg/m    GEN: well developed male, in no acute distress  CV:  DP pulse not palpable RESP: no increased work of breathing MSK: foot and ankle edema on the left, no calf tenderness  SKIN: warm, dry, left foot: erythema, serous fluid on toes, some warmth compared to right,  no palpable DP pulses       ASSESSMENT/PLAN:   Pain and swelling of left lower leg Wound culture positive for S. Aureus and sensitive to tetracycline. Pt is not improving despite 5 days of doxycycline. Has significant pain and no palable pulse on exam. Discussed need for emergent imaging to use out vascular occulusion. Previous negative for DVT. Hx of DVT and critical limb ischemia. Follows with Dr. [place]. Samples of Eliquis given for 1 week. Rx sent [facility].   Patient to go to the ED for further evaluation. Spoke with RN and FPTS inpatient team about pt's arrival.    Functional asplenia  TDAP, menactra, HIB given today. Pneumococcal vaccine not available today.  Discuss PCP13/PPSV23 at  next visit if available.    Spoke with pharmacy team, Evan Rodriguez, about Eliquis.  Recommended Rx be sent to [facility] for $10/30d Rx until pt is approved for Eliquis through his application.   [facility], DO PGY-3, [facility] 05/21/2021

## 2021-05-21 NOTE — Assessment & Plan Note (Addendum)
Wound culture positive for S. Aureus and sensitive to tetracycline. Pt is not improving despite 5 days of doxycycline. Has significant pain and no palable pulse on exam. Discussed need for emergent imaging to use out vascular occulusion. Previous negative [place] for DVT. Hx of DVT and critical limb ischemia. Follows with Dr. [name]. Samples of Eliquis given for 1 week. Rx sent to [facility].   Patient to go to the ED for further evaluation. Spoke with RN and FPTS inpatient team about pt's arrival.

## 2021-05-22 ENCOUNTER — Other Ambulatory Visit: Payer: Self-pay

## 2021-05-22 ENCOUNTER — Encounter (HOSPITAL_COMMUNITY): Payer: Self-pay | Admitting: Family Medicine

## 2021-05-22 ENCOUNTER — Inpatient Hospital Stay (HOSPITAL_COMMUNITY): Payer: Medicaid Other

## 2021-05-22 ENCOUNTER — Encounter: Payer: Self-pay | Admitting: Family Medicine

## 2021-05-22 ENCOUNTER — Encounter (HOSPITAL_COMMUNITY): Payer: Medicaid Other

## 2021-05-22 DIAGNOSIS — L03116 Cellulitis of left lower limb: Secondary | ICD-10-CM

## 2021-05-22 DIAGNOSIS — I998 Other disorder of circulatory system: Secondary | ICD-10-CM

## 2021-05-22 DIAGNOSIS — I748 Embolism and thrombosis of other arteries: Secondary | ICD-10-CM

## 2021-05-22 DIAGNOSIS — I739 Peripheral vascular disease, unspecified: Secondary | ICD-10-CM

## 2021-05-22 DIAGNOSIS — I70244 Atherosclerosis of native arteries of left leg with ulceration of heel and midfoot: Secondary | ICD-10-CM

## 2021-05-22 DIAGNOSIS — Z7901 Long term (current) use of anticoagulants: Secondary | ICD-10-CM

## 2021-05-22 LAB — BASIC METABOLIC PANEL
Anion gap: 8 (ref 5–15)
BUN: 12 mg/dL (ref 6–20)
CO2: 25 mmol/L (ref 22–32)
Calcium: 9.4 mg/dL (ref 8.9–10.3)
Chloride: 102 mmol/L (ref 98–111)
Creatinine, Ser: 0.85 mg/dL (ref 0.61–1.24)
GFR, Estimated: >60 mL/min (ref >60)
Glucose, Bld: 132 mg/dL — ABNORMAL HIGH (ref 70–99)
Potassium: 3.4 mmol/L — ABNORMAL LOW (ref 3.5–5.1)
Sodium: 135 mmol/L (ref 135–145)

## 2021-05-22 LAB — CBC
HCT: 47.3 % (ref 39.0–52.0)
Hemoglobin: 15.3 g/dL (ref 13.0–17.0)
MCH: 28.8 pg (ref 26.0–34.0)
MCHC: 32.3 g/dL (ref 30.0–36.0)
MCV: 88.9 fL (ref 80.0–100.0)
Platelets: 275 10*3/uL (ref 150–400)
RBC: 5.32 MIL/uL (ref 4.22–5.81)
RDW: 15.9 % — ABNORMAL HIGH (ref 11.5–15.5)
WBC: 8.8 10*3/uL (ref 4.0–10.5)
nRBC: 0 % (ref 0.0–0.2)

## 2021-05-22 LAB — APTT
aPTT: 49 seconds — ABNORMAL HIGH (ref 24–36)
aPTT: 51 seconds — ABNORMAL HIGH (ref 24–36)

## 2021-05-22 LAB — HEPARIN LEVEL (UNFRACTIONATED)
Heparin Unfractionated: 0.54 IU/mL (ref 0.30–0.70)
Heparin Unfractionated: 0.55 IU/mL (ref 0.30–0.70)

## 2021-05-22 LAB — LACTIC ACID, PLASMA: Lactic Acid, Venous: 1.4 mmol/L (ref 0.5–1.9)

## 2021-05-22 MED ORDER — SODIUM CHLORIDE 0.9 % IV SOLN
INTRAVENOUS | Status: DC | PRN
  Administered 2021-05-22 – 2021-05-24 (×2): 250 mL via INTRAVENOUS

## 2021-05-22 MED ORDER — POTASSIUM CHLORIDE CRYS ER 10 MEQ PO TBCR
30.0000 meq | EXTENDED_RELEASE_TABLET | Freq: Once | ORAL | Status: AC
  Administered 2021-05-22: 30 meq via ORAL
  Filled 2021-05-22: qty 3

## 2021-05-22 MED ORDER — PIPERACILLIN-TAZOBACTAM 3.375 G IVPB
3.3750 g | Freq: Three times a day (TID) | INTRAVENOUS | Status: DC
  Administered 2021-05-22 – 2021-05-26 (×13): 3.375 g via INTRAVENOUS
  Filled 2021-05-22 (×14): qty 50

## 2021-05-22 MED ORDER — ASPIRIN EC 81 MG PO TBEC
81.0000 mg | DELAYED_RELEASE_TABLET | Freq: Every day | ORAL | Status: DC
  Administered 2021-05-22 – 2021-05-28 (×7): 81 mg via ORAL
  Filled 2021-05-22 (×7): qty 1

## 2021-05-22 NOTE — Consult Note (Signed)
ASSESSMENT & PLAN   PERIPHERAL VASCULAR DISEASE WITH ULCER LEFT FOOT: I suspect that his femoral to peroneal artery bypass graft is occluded.  He previously had a brisk peroneal signal with a Doppler but now has a dampened monophasic peroneal signal.  Based on my review of his op note there are no further options for revascularization.  He had a 2 mm peroneal artery which was his only runoff.  A duplex of his graft has been ordered.  If the graft is occluded and the foot progresses he would require a below the knee amputation with a 15% chance of nonhealing.  If the graft is open and there is really nothing further to do except continue the antibiotics for now.  If the wound progressed again he would require primary amputation.  His Eliquis is being hold and he is now on heparin.  If he does require amputation the soonest we can do this would be Friday when he has been off his Eliquis for 48 hours.  REASON FOR CONSULT:    Ischemic left foot.  The consult is requested by Dr. Allena Katz  HPI:   Evan Rodriguez is a 45 y.o. 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  He also has history of coronary artery disease and had a myocardial infarction 8 years ago.  He is undergone previous PTCA.  On 02/05/2021 he underwent a left superficial femoral artery to peroneal artery bypass with a vein graft.  Of note the peroneal artery was very small and was only 2 mm in diameter.  I felt that if the graft occluded there would really be nothing different to do given the very poor outflow.  Initially had a reasonable peroneal signal with a Doppler and has been seen 3 times in the office since surgery and was doing well except for the wound on his left great toe.  He did have a graft duplex on 03/27/2021 which showed that the graft was patent however there was monophasic flow throughout with low flow velocities.  When he was in the office  on 05/08/21 he was having more problems with the left great toe and he states the pain in the great toe has improved since then.  He is not really having rest pain in the foot is mostly of the toe that hurts.  This reason he presented to the emergency department with cellulitis of the left foot.  Of note he was diagnosed with thrombosis of the splenic artery and has been on Eliquis.  Past Medical History:  Diagnosis Date   Abdominal pain 03/12/2021   Coronary artery disease    COVID-19    Elevated sed rate    Hypokalemia 01/22/2021   Hyponatremia 03/12/2021   NSTEMI (non-ST elevated myocardial infarction) (HCC) 02/22/2016   Stented   Vaping nicotine dependence, non-tobacco product 01/22/2021    Family History  Problem Relation Age of Onset   Hypertension Maternal Grandfather     SOCIAL HISTORY: Social History   Tobacco Use   Smoking status: Every Day    Packs/day: 1.50    Years: 20.00    Pack years: 30.00    Types: Cigarettes   Smokeless tobacco: Former    Types: Chew    Quit date: 02/05/2019   Tobacco comments:    quit chew tobacco in 2020  Substance Use Topics   Alcohol use: Yes    Comment: occasionally/monthly  No Known Allergies  Current Facility-Administered Medications  Medication Dose Route Frequency Provider Last Rate Last Admin   acetaminophen (TYLENOL) tablet 1,000 mg  1,000 mg Oral Q6H Patel, Poonam, MD   1,000 mg at 05/21/21 2343   heparin ADULT infusion 100 units/mL (25000 units/246mL)  1,100 Units/hr Intravenous Continuous Towanda Octave, MD 11 mL/hr at 05/21/21 2342 1,100 Units/hr at 05/21/21 2342   nicotine (NICODERM CQ - dosed in mg/24 hours) patch 14 mg  14 mg Transdermal Daily Towanda Octave, MD   14 mg at 05/21/21 2343   oxyCODONE (Oxy IR/ROXICODONE) immediate release tablet 5 mg  5 mg Oral Q6H PRN Towanda Octave, MD   5 mg at 05/21/21 2343   rosuvastatin (CRESTOR) tablet 20 mg  20 mg Oral Daily Towanda Octave, MD       senna-docusate (Senokot-S) tablet 1  tablet  1 tablet Oral BID Towanda Octave, MD   1 tablet at 05/21/21 2345   Current Outpatient Medications  Medication Sig Dispense Refill   apixaban (ELIQUIS) 5 MG TABS tablet Take 1 tablet (5 mg total) by mouth 2 (two) times daily. 14 tablet 0   apixaban (ELIQUIS) 5 MG TABS tablet Take 1 tablet (5 mg total) by mouth 2 (two) times daily. 60 tablet 2   clotrimazole (LOTRIMIN) 1 % cream Apply topically 2 (two) times daily. 30 g 0   doxycycline (VIBRA-TABS) 100 MG tablet Take 1 tablet (100 mg total) by mouth 2 (two) times daily. 28 tablet 0   nicotine (NICODERM CQ - DOSED IN MG/24 HOURS) 14 mg/24hr patch Place 1 patch (14 mg total) onto the skin daily. 28 patch 0   promethazine (PHENERGAN) 25 MG tablet Take 1 tablet (25 mg total) by mouth every 6 (six) hours as needed for nausea or vomiting. 60 tablet 1   rosuvastatin (CRESTOR) 20 MG tablet Take 1 tablet (20 mg total) by mouth daily. 90 tablet 3   senna-docusate (SENOKOT-S) 8.6-50 MG tablet Take 1 tablet by mouth 2 (two) times daily.      REVIEW OF SYSTEMS:  [X]  denotes positive finding, [ ]  denotes negative finding Cardiac  Comments:  Chest pain or chest pressure:    Shortness of breath upon exertion:    Short of breath when lying flat:    Irregular heart rhythm:        Vascular    Pain in calf, thigh, or hip brought on by ambulation:    Pain in feet at night that wakes you up from your sleep:     Blood clot in your veins:    Leg swelling:         Pulmonary    Oxygen at home:    Productive cough:     Wheezing:         Neurologic    Sudden weakness in arms or legs:     Sudden numbness in arms or legs:     Sudden onset of difficulty speaking or slurred speech:    Temporary loss of vision in one eye:     Problems with dizziness:         Gastrointestinal    Blood in stool:     Vomited blood:         Genitourinary    Burning when urinating:     Blood in urine:        Psychiatric    Major depression:         Hematologic     Bleeding problems:  Problems with blood clotting too easily:        Skin    Rashes or ulcers:        Constitutional    Fever or chills:    -  PHYSICAL EXAM:   Vitals:   05/21/21 2030 05/21/21 2130 05/21/21 2230 05/21/21 2300  BP: 109/86 117/80 (!) 120/93 118/86  Pulse: 92 93 96 91  Resp:  15 20 19   Temp:      TempSrc:      SpO2: 97% 99% 97% 97%  Weight:      Height:       Body mass index is 24.91 kg/m. GENERAL: The patient is a well-nourished male, in no acute distress. The vital signs are documented above. CARDIAC: There is a regular rate and rhythm.  VASCULAR: I do not detect carotid bruits. He has palpable femoral pulses. On the right side he has a monophasic dorsalis pedis and posterior tibial signal. On the left side he has a barely audible peroneal signal with the Doppler. PULMONARY: There is good air exchange bilaterally without wheezing or rales. ABDOMEN: Soft and non-tender with normal pitched bowel sounds.  MUSCULOSKELETAL: There are no major deformities. NEUROLOGIC: No focal weakness or paresthesias are detected. SKIN: The wounds on the left foot are worse as documented in the photograph below.   PSYCHIATRIC: The patient has a normal affect.  DATA:    Graft duplex and ABIs are pending.  [facility]

## 2021-05-22 NOTE — Progress Notes (Signed)
ANTICOAGULATION CONSULT NOTE  Pharmacy Consult for heparin Indication: Ischemic limb, hx splenic artery thrombosis  No Known Allergies  Patient Measurements: Height: 5\' 6"  (167.6 cm) Weight: 70 kg (154 lb 5.2 oz) IBW/kg (Calculated) : 63.8 Heparin Dosing Weight: TBW  Vital Signs: BP: 101/75 (07/20 0602) Pulse Rate: 81 (07/20 0602)  Labs: Recent Labs    05/21/21 1830 05/22/21 0358 05/22/21 0822  HGB 16.9  --  15.3  HCT 51.5  --  47.3  PLT 295  --  275  APTT  --   --  49*  LABPROT 14.3  --   --   INR 1.1  --   --   HEPARINUNFRC  --   --  0.54  CREATININE 0.83 0.85  --      Estimated Creatinine Clearance: 97 mL/min (by C-G formula based on SCr of 0.85 mg/dL).   Assessment: 16 YOM presenting with foot pain/skin changes with hx of PAD s/p bypass, on Eliquis PTA for hx splenic artery thrombosis, last dose 7/19 AM.  aPTT is subtherapeutic at 49 sec on 1100 units/hr; heparin level appears at goal of 0.54 but may still be influenced by recent Eliquis use. No bleeding noted, CBC is normal. No problems with bleeding or infusion per RN.  Goal of Therapy:  Heparin level 0.3-0.7 units/ml aPTT 66-102 seconds Monitor platelets by anticoagulation protocol: Yes   Plan:  Increase heparin gtt to 1300 units/hr, no bolus F/u 6 hour aPTT, heparin level Monitor for s/sx of bleeding F/u VVS plans  Thank you for involving pharmacy in this patient's care.  8/19, PharmD, BCPS Clinical Pharmacist Clinical phone for 05/22/2021 until 3p is [phone] 05/22/2021 9:26 AM  **Pharmacist phone directory can be found on [email or web address] listed under [facility]**

## 2021-05-22 NOTE — Progress Notes (Signed)
Family Medicine Teaching Service Daily Progress Note Intern Pager: [phone]  Patient name: Evan Rodriguez Medical record number: [id number] Date of birth: 16-Jul-1976 Age: 45 y.o. Gender: male  Primary Care Provider: Reece Leader, DO Consultants: VVS Code Status: Full  Pt Overview and Major Events to Date:  7/19- admitted  Assessment and Plan: Evan Rodriguez is a 45 yo male with a history of PAD, MI, CAD, and splenic artery thrombosis who presented with L foot pain, found to have LLE ischemia and overlying cellulitis.   LLE cellulitis  LLE ischemia PAD S/p left peroneal artery bypass graft (02/2021) Seen by VVS overnight who suspects occluded peroneal artery bypass graft. If it is indeed occluded, will require amputation. Wounds of LLE stable from admission. Wound cx 6 days ago with MSSA. Okay with no MRSA coverage given low likelihood of MSSA/MRSA co-infection. Blood cx with no growth at 12 hours.  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 Functionally asplenic. Not on ASA at home.  - Hold Eliquis is setting of likely need for surgery - Heparin gtt as above  - Rosuvastatin 20mg  daily  - Should be on ASA, will restart after surgery or once determined not to require surgery  Hypokalemia K 3.4 on am labs.  - Repleted.   FEN/GI: Heart Healthy Diet PPx: On heparin gtt as above Dispo:Pending PT recommendations  in 2-3 days. Barriers include possible need for amputation.   Subjective:  Evan Rodriguez reports that he is "alright."  Says he did not sleep last night on account of being in the ED. Declines participating in posterior lung exam. Desires to be "left alone."  Objective: Temp:  [96.9 F  (36.1 C)-98.1 F (36.7 C)] 96.9 F (36.1 C) (07/19 1921) Pulse Rate:  [81-109] 81 (07/20 0602) Resp:  [15-22] 18 (07/20 0602) BP: (98-120)/(74-93) 101/75 (07/20 0602) SpO2:  [93 %-100 %] 97 % (07/20 0602) Weight:  [70 kg-70.2 kg] 70 kg (07/19 1803) Physical Exam: General: Lying in ED stretcher, head covered with sheet Cardiovascular: Regular rate, regular rhythm, no murmurs Respiratory: Lungs clear to auscultation anteriorly, patient declines posterior lung exam Abdomen: Soft, nontender, nondistended Extremities: Left lower extremity exam unchanged from previous, no palpable distal pulses, cooler to touch compared to right.  Photo in chart Psych: Does not make eye contact, withdrawn  Laboratory: Recent Labs  Lab 05/21/21 1830  WBC 11.3*  HGB 16.9  HCT 51.5  PLT 295   Recent Labs  Lab 05/21/21 1830 05/22/21 0358  NA 136 135  K 4.5 3.4*  CL 100 102  CO2 25 25  BUN 10 12  CREATININE 0.83 0.85  CALCIUM 9.9 9.4  PROT 8.3*  --   BILITOT 0.7  --   ALKPHOS 107  --   ALT 25  --   AST 19  --   GLUCOSE 112* 132*     Imaging/Diagnostic Tests: ABI and Graft Duplex pending    05/23/21, MD 05/22/2021, 6:26 AM PGY-1, [facility] Family Medicine FPTS Intern pager: [phone], text pages welcome

## 2021-05-22 NOTE — Progress Notes (Signed)
FPTS Brief Progress Note  S: Pt sleeping comfortably in bed this evening. I did not wake the patient. MEWS 0.   O: BP 106/77 (BP Location: Right Arm)   Pulse 88   Temp 98.2 F (36.8 C) (Oral)   Resp 16   Ht 5\' 6"  (1.676 m)   Wt 70 kg   SpO2 99%   BMI 24.91 kg/m    General: sleeping Cardio: well perfused  Pulm: normal work of breathing  A/P: LLE cellullitis  LLE ischemia  Duplex scan: left SFA to peroneal artery bypass graft occluded  -Continue Zosyn  -Continue Heparin gtt -VVS following, BKA scheduled for 7/22 -For pain: oxycodone 5mg  Q6PRN, tylenol 1000mg  Q6H - Orders reviewed. Labs for AM ordered, which was adjusted as needed.   8/22, MD 05/22/2021, 8:13 PM PGY-3, [facility] Night Resident  Please page [phone] with questions.

## 2021-05-22 NOTE — Progress Notes (Signed)
ABI and left lower extremity bypass graft duplex completed. Refer to "CV Proc" under chart review to view preliminary results.  05/22/2021 3:46 PM Eula Fried., MHA, RVT, RDCS, RDMS

## 2021-05-22 NOTE — Consult Note (Signed)
WOC Nurse Consult Note: Reason for Consult:left foot cellulitis.WOC is simultaneous consulted with VVS and Dr. Edilia Bo has seen. May be a candidate for amputation of this extremity by week's end. Conservative care orders will be placed for Nursing until determination about limb salvage is made by Vascular Surgery. Wound type: Arterial insufficiency, s/p bypass. Pressure Injury POA:N/A Measurement:N/A see photos Wound XMI:WOEH, moist at foot/toes; dried serum (scab) at incision Drainage (amount, consistency, odor) small, serous Periwound: rubor, thin, brittle nails Dressing procedure/placement/frequency: I have provided Nursing with guidance for topical antimicrobial care to the left foot/digits, also for betadine painting of the LE scab.  WOC nursing team will not follow, but will remain available to this patient, the nursing and medical teams.  Please re-consult if needed. Thanks, Ladona Mow, MSN, RN, GNP, [facility]  Pager# [phone]

## 2021-05-22 NOTE — Progress Notes (Addendum)
ANTICOAGULATION CONSULT NOTE - Follow Up Consult  Pharmacy Consult for IV Heparin Indication: Ischemic limb, hx splenic artery thrombosis  No Known Allergies  Patient Measurements: Height: 5\' 6"  (167.6 cm) Weight: 70 kg (154 lb 5.2 oz) IBW/kg (Calculated) : 63.8 Heparin Dosing Weight: 70 kg  Vital Signs: Temp: 98.2 F (36.8 C) (07/20 1743) Temp Source: Oral (07/20 1743) BP: 106/77 (07/20 1743) Pulse Rate: 88 (07/20 1743)  Labs: Recent Labs    05/21/21 1830 05/22/21 0358 05/22/21 0822 05/22/21 1600  HGB 16.9  --  15.3  --   HCT 51.5  --  47.3  --   PLT 295  --  275  --   APTT  --   --  49* 51*  LABPROT 14.3  --   --   --   INR 1.1  --   --   --   HEPARINUNFRC  --   --  0.54 0.55  CREATININE 0.83 0.85  --   --     Estimated Creatinine Clearance: 97 mL/min (by C-G formula based on SCr of 0.85 mg/dL).   Assessment: 45 yr old man presented with foot pain/skin changes with hx of PAD, S/P bypass, on apixaban PTA for hx splenic artery thrombosis (last dose 7/19 AM). Given recent apixaban use, will monitor anticoagulation using aPTT until aPTT and heparin levels correlate.  aPTT and heparin level ~6 hrs after heparin infusion was increased to 1300 units/hr were 51 sec (below the desired goal range) and 0.55 units/ml, respectively, indicating that apixaban is still influencing heparin level. H/H 15.3/47.3, plt 275. Per RN, no issues with IV or bleeding observed.  Goal of Therapy:  Heparin level 0.3-0.7 units/ml aPTT 66-102 seconds Monitor platelets by anticoagulation protocol: Yes   Plan:  Increase heparin infusion to 1500 units/hr, no bolus Check 6-hr aPTT/heparin level Monitor daily aPTT, heparin level, CBC Monitor for bleeding F/u VVS plans  Thank you for involving pharmacy in this patient's care.  12-30-1973, PharmD, BCPS, [facility] Clinical Pharmacist 05/22/2021 6:08 PM

## 2021-05-22 NOTE — Progress Notes (Signed)
NEW ADMISSION NOTE New Admission Note:   Arrival Method: Chair Mental Orientation: AAOx4 Telemetry: n/a  Assessment: Completed Skin: LLE ischemia IV: RH Pain: 3/10 Tubes: n/a Safety Measures: Safety Fall Prevention Plan has been given, discussed and signed Admission: Completed [facility] Orientation: Patient has been orientated to the room, unit and staff.  Family: none at bedside  Orders have been reviewed and implemented. Will continue to monitor the patient. Call light has been placed within reach and bed alarm has been activated.   Myrtis Hopping, RN

## 2021-05-22 NOTE — Plan of Care (Signed)
  Problem: Education: Goal: Knowledge of General Education information will improve Description Including pain rating scale, medication(s)/side effects and non-pharmacologic comfort measures Outcome: Progressing   

## 2021-05-22 NOTE — Evaluation (Signed)
Physical Therapy Evaluation Patient Details Name: Evan Rodriguez MRN: [id number] DOB: 1976/08/29 Today's Date: 05/22/2021   History of Present Illness  45 year old  presenting with pain and erythema of the left lower extremity consistent with progression of his cellulitis and worsening vascular disease. with history of peripheral vascular disease status post superficial femoral artery to peroneal artery bypass with a vein graft in April of this year, coronary artery disease status post PCI therapy to the mid circumflex and LAD, tobacco abuse and history of splenic vein thrombus on Eliquis therapy  Clinical Impression   Pt admitted with above diagnosis. Lives with his kids in a single level home with a ramped entrance; independent and working; Presents to PT with L foot ischemic changes and pain,  but they don't effect gait at this point; Noting likelihood of need for BKA and pt is aware; Discussed possibility/likelhood of amputation, and he seemed resigned to a BKA as inevitable; Lots of his family members have amputations, and he is somewheat familiar with recovery course; We discussed the importance of strengthening gluteals, quads and stretching hip flexors and hamstrings in prep for prosthesis, if he has a BKA; Pt currently with functional limitations due to the deficits listed below (see PT Problem List). Pt will benefit from skilled PT to increase their independence and safety with mobility to allow discharge to the venue listed below.       Follow Up Recommendations Other (comment) (Will depend on medical decisions re: L LE management)    Equipment Recommendations  None recommended by PT (at this time; will consider RW if needed)    Recommendations for Other Services       Precautions / Restrictions Precautions Precautions: None Restrictions Weight Bearing Restrictions: No      Mobility  Bed Mobility Overal bed mobility: Independent                  Transfers Overall  transfer level: Independent Equipment used: None                Ambulation/Gait Ambulation/Gait assistance: Independent Gait Distance (Feet): 300 Feet Assistive device: None Gait Pattern/deviations: Step-through pattern     General Gait Details: Painful L foot, but no difficulty walking  Stairs            Wheelchair Mobility    Modified Rankin (Stroke Patients Only)       Balance Overall balance assessment: Independent                                           Pertinent Vitals/Pain Pain Assessment: 0-10 Pain Score: 5  Pain Location: L foot Pain Descriptors / Indicators: Aching;Constant Pain Intervention(s): Monitored during session    Home Living Family/patient expects to be discharged to:: Private residence Living Arrangements: Children Available Help at Discharge: Family;Available PRN/intermittently Type of Home: Mobile home Home Access: Ramped entrance     Home Layout: One level Home Equipment: Walker - 2 wheels Additional Comments: Pt reports his children are 67 and 58 yo.    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, his goal is to get back to work     Higher education careers adviser Dominance   Dominant Hand: Left    Extremity/Trunk Assessment   Upper Extremity Assessment Upper Extremity Assessment: Overall WFL for tasks  assessed    Lower Extremity Assessment Lower Extremity Assessment: LLE deficits/detail LLE Deficits / Details: L foot with ischemic changes, erythema; painful, but able to walk without difficulty    Cervical / Trunk Assessment Cervical / Trunk Assessment: Normal  Communication   Communication: No difficulties  Cognition Arousal/Alertness: Awake/alert Behavior During Therapy: WFL for tasks assessed/performed Overall Cognitive Status: Within Functional Limits for tasks assessed                                        General Comments  General comments (skin integrity, edema, etc.): Discussed possibility/likelhood of amputation, and he seemed resigned to a BKA as inevitable; Lots of his family members have amputations, and he is somewheat familiar with recovery course; We discussed the importance of strengthening gluteals, quads and stretching hip flexors and hamstrings in prep for prosthesis, if he has a BKA    Exercises     Assessment/Plan    PT Assessment  (especially if his medical course involves surgery for amputation)  PT Problem List         PT Treatment Interventions      PT Goals (Current goals can be found in the Care Plan section)  Acute Rehab PT Goals Patient Stated Goal: He wants definitive plan for L foot, and to return to work PT Goal Formulation: With patient Time For Goal Achievement: 06/05/21 Potential to Achieve Goals: Good    Frequency     Barriers to discharge        Co-evaluation               AM-PAC PT "6 Clicks" Mobility  Outcome Measure Help needed turning from your back to your side while in a flat bed without using bedrails?: None Help needed moving from lying on your back to sitting on the side of a flat bed without using bedrails?: None Help needed moving to and from a bed to a chair (including a wheelchair)?: None Help needed standing up from a chair using your arms (e.g., wheelchair or bedside chair)?: None Help needed to walk in hospital room?: None Help needed climbing 3-5 steps with a railing? : None 6 Click Score: 24    End of Session   Activity Tolerance: Patient tolerated treatment well Patient left: in bed;with call bell/phone within reach Nurse Communication: Mobility status PT Visit Diagnosis: Other abnormalities of gait and mobility (R26.89);Pain Pain - Right/Left: Left Pain - part of body: Ankle and joints of foot    Time: 5456-2563 PT Time Calculation (min) (ACUTE ONLY): 15 min   Charges:   PT Evaluation $PT Eval Low Complexity: 1 Low           Van Clines, PT  Acute Rehabilitation Services Pager [phone] Office [phone]   Levi Aland 05/22/2021, 6:52 PM

## 2021-05-22 NOTE — Progress Notes (Signed)
Physical Therapy Note  PT eval complete with full note to follow;  DC recommendations will largely depend on medical course for LLE, and decisions re: possible surgical intervention; Pt walked well in the hallway today, and was receptive to preop education re: considerations post op if amputation is needed;  Will follow acutely,    Van Clines, PT  Acute Rehabilitation Services Pager [phone] Office [phone]

## 2021-05-22 NOTE — ED Notes (Signed)
Pt refused EKG at this time. Pt reports EKG is not needed for foot infection

## 2021-05-22 NOTE — ED Notes (Signed)
Pt provided turkey sandwich & water  

## 2021-05-22 NOTE — Progress Notes (Signed)
Attempted ABI and LE bypass graft duplex, however patient states he "doesn't want the test done now because he's waiting on his breakfast tray." Will attempt again as schedule permits.  05/22/2021 10:14 AM Eula Fried., MHA, RVT, RDCS, RDMS

## 2021-05-22 NOTE — Progress Notes (Signed)
VASCULAR SURGERY:  His duplex scan shows that his left superficial femoral artery to peroneal artery bypass graft is occluded.  It is difficult to determine when this occluded however given the very tiny peroneal artery which is his only runoff I do not think that there is any chance to salvage the graft.  Likewise this artery does not runoff into the foot and he is still having problems with the wounds when the bypass graft was functioning well.  If the foot progresses he would require a below the knee amputation on the left.   Cari Caraway, MD 4:29 PM

## 2021-05-23 ENCOUNTER — Other Ambulatory Visit: Payer: Self-pay

## 2021-05-23 LAB — BASIC METABOLIC PANEL
Anion gap: 7 (ref 5–15)
BUN: 16 mg/dL (ref 6–20)
CO2: 22 mmol/L (ref 22–32)
Calcium: 9 mg/dL (ref 8.9–10.3)
Chloride: 105 mmol/L (ref 98–111)
Creatinine, Ser: 1.07 mg/dL (ref 0.61–1.24)
GFR, Estimated: >60 mL/min (ref >60)
Glucose, Bld: 124 mg/dL — ABNORMAL HIGH (ref 70–99)
Potassium: 3.7 mmol/L (ref 3.5–5.1)
Sodium: 134 mmol/L — ABNORMAL LOW (ref 135–145)

## 2021-05-23 LAB — CBC
HCT: 43.7 % (ref 39.0–52.0)
Hemoglobin: 14.2 g/dL (ref 13.0–17.0)
MCH: 29 pg (ref 26.0–34.0)
MCHC: 32.5 g/dL (ref 30.0–36.0)
MCV: 89.2 fL (ref 80.0–100.0)
Platelets: 226 10*3/uL (ref 150–400)
RBC: 4.9 MIL/uL (ref 4.22–5.81)
RDW: 15.7 % — ABNORMAL HIGH (ref 11.5–15.5)
WBC: 9.4 10*3/uL (ref 4.0–10.5)
nRBC: 0 % (ref 0.0–0.2)

## 2021-05-23 LAB — SURGICAL PCR SCREEN

## 2021-05-23 LAB — APTT
aPTT: 77 seconds — ABNORMAL HIGH (ref 24–36)
aPTT: 61 seconds — ABNORMAL HIGH (ref 24–36)

## 2021-05-23 LAB — HEPARIN LEVEL (UNFRACTIONATED): Heparin Unfractionated: 0.74 IU/mL — ABNORMAL HIGH (ref 0.30–0.70)

## 2021-05-23 LAB — HEMOGLOBIN A1C
Hgb A1c MFr Bld: 6 % — ABNORMAL HIGH (ref 4.8–5.6)
Mean Plasma Glucose: 125.5 mg/dL

## 2021-05-23 MED ORDER — PNEUMOCOCCAL 13-VAL CONJ VACC IM SUSP
0.5000 mL | Freq: Once | INTRAMUSCULAR | Status: DC
  Filled 2021-05-23: qty 0.5

## 2021-05-23 MED ORDER — HEPARIN (PORCINE) 25000 UT/250ML-% IV SOLN
1600.0000 [IU]/h | INTRAVENOUS | Status: AC
  Administered 2021-05-23 – 2021-05-24 (×2): 1600 [IU]/h via INTRAVENOUS
  Filled 2021-05-23 (×2): qty 250

## 2021-05-23 MED ORDER — NICOTINE 21 MG/24HR TD PT24
21.0000 mg | MEDICATED_PATCH | Freq: Every day | TRANSDERMAL | Status: DC
  Administered 2021-05-24 – 2021-05-28 (×5): 21 mg via TRANSDERMAL
  Filled 2021-05-23 (×5): qty 1

## 2021-05-23 NOTE — H&P (View-Only) (Signed)
   VASCULAR SURGERY ASSESSMENT & PLAN:   PERIPHERAL VASCULAR DISEASE WITH ULCER LEFT FOOT: His duplex scan yesterday shows that the bypass graft is occluded.  I do not think there are any further options for revascularization.  His only runoff is a 2 mm peroneal artery which ends at the ankle.  I had a long discussion with the patient this morning and he would like to proceed with left below the knee amputation tomorrow.  He discussed this with his daughter on the phone.  His Eliquis is on hold and is on IV heparin.  We will hold his heparin prior to surgery tomorrow.  I have discussed the indications for the procedure and the risk of the procedure including a 10% risk of nonhealing of below the knee amputation.  He is agreeable to proceed.  He will continue his intravenous antibiotics for now.   I have written preop orders.  SUBJECTIVE:   Still with left foot pain.  PHYSICAL EXAM:   Vitals:   05/22/21 1415 05/22/21 1743 05/22/21 2146 05/23/21 0512  BP: 103/73 106/77 109/77 111/86  Pulse: 93 88 92 89  Resp: 16 16 18 17  Temp:  98.2 F (36.8 C) 98.2 F (36.8 C) (!) 97.5 F (36.4 C)  TempSrc:  Oral Oral Oral  SpO2: 98% 99% 96% 96%  Weight:      Height:       The cellulitis in the left foot has improved.  LABS:   Lab Results  Component Value Date   WBC 9.4 05/23/2021   HGB 14.2 05/23/2021   HCT 43.7 05/23/2021   MCV 89.2 05/23/2021   PLT 226 05/23/2021   Lab Results  Component Value Date   CREATININE 1.07 05/23/2021   Lab Results  Component Value Date   INR 1.1 05/21/2021    PROBLEM LIST:    Active Problems:   History of non-ST elevation myocardial infarction (NSTEMI)   Dyslipidemia (high LDL; low HDL)   Vaping nicotine dependence, non-tobacco product   PAD (peripheral artery disease) (HCC)   Functional asplenia   Lower limb ischemia   Cellulitis of left lower extremity   CURRENT MEDS:    acetaminophen  1,000 mg Oral Q6H   aspirin EC  81 mg Oral Daily    nicotine  14 mg Transdermal Daily   rosuvastatin  20 mg Oral Daily   senna-docusate  1 tablet Oral BID    Tinslee Klare Office: [phone] 05/23/2021  

## 2021-05-23 NOTE — Progress Notes (Signed)
   VASCULAR SURGERY ASSESSMENT & PLAN:   PERIPHERAL VASCULAR DISEASE WITH ULCER LEFT FOOT: His duplex scan yesterday shows that the bypass graft is occluded.  I do not think there are any further options for revascularization.  His only runoff is a 2 mm peroneal artery which ends at the ankle.  I had a long discussion with the patient this morning and he would like to proceed with left below the knee amputation tomorrow.  He discussed this with his daughter on the phone.  His Eliquis is on hold and is on IV heparin.  We will hold his heparin prior to surgery tomorrow.  I have discussed the indications for the procedure and the risk of the procedure including a 10% risk of nonhealing of below the knee amputation.  He is agreeable to proceed.  He will continue his intravenous antibiotics for now.   I have written preop orders.  SUBJECTIVE:   Still with left foot pain.  PHYSICAL EXAM:   Vitals:   05/22/21 1415 05/22/21 1743 05/22/21 2146 05/23/21 0512  BP: 103/73 106/77 109/77 111/86  Pulse: 93 88 92 89  Resp: 16 16 18 17   Temp:  98.2 F (36.8 C) 98.2 F (36.8 C) (!) 97.5 F (36.4 C)  TempSrc:  Oral Oral Oral  SpO2: 98% 99% 96% 96%  Weight:      Height:       The cellulitis in the left foot has improved.  LABS:   Lab Results  Component Value Date   WBC 9.4 05/23/2021   HGB 14.2 05/23/2021   HCT 43.7 05/23/2021   MCV 89.2 05/23/2021   PLT 226 05/23/2021   Lab Results  Component Value Date   CREATININE 1.07 05/23/2021   Lab Results  Component Value Date   INR 1.1 05/21/2021    PROBLEM LIST:    Active Problems:   History of non-ST elevation myocardial infarction (NSTEMI)   Dyslipidemia (high LDL; low HDL)   Vaping nicotine dependence, non-tobacco product   PAD (peripheral artery disease) (HCC)   Functional asplenia   Lower limb ischemia   Cellulitis of left lower extremity   CURRENT MEDS:    acetaminophen  1,000 mg Oral Q6H   aspirin EC  81 mg Oral Daily    nicotine  14 mg Transdermal Daily   rosuvastatin  20 mg Oral Daily   senna-docusate  1 tablet Oral BID    05/23/2021 Office: [phone] 05/23/2021

## 2021-05-23 NOTE — Progress Notes (Addendum)
Family Medicine Teaching Service Daily Progress Note Intern Pager: [phone]  Patient name: Evan Rodriguez Medical record number: [id number] Date of birth: [birth year] Age: 45 y.o. Gender: male  Primary Care Provider: Reece Leader, DO Consultants: VVS Code Status: Full  Pt Overview and Major Events to Date:  7/19- admitted 7/22- Scheduled for OR for L BKA  Assessment and Plan: [name] is a 45 year old male with history of PAD, MI, CAD, and splenic artery thrombosis who presented with left foot pain, found to have left lower limb ischemia and overlying cellulitis.  LLE cellulitis  LLE ischemia PAD S/p left peroneal artery bypass graft (02/2021) Graft duplex ultrasound yesterday with evidence of occlusion of left peroneal artery bypass graft.  ABI consistent with critical limb ischemia.  Vascular with recommendation for BKA.  Scheduled for the OR tomorrow for this procedure.  Blood cultures remain without growth. 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 midnight ahead of surgery PPx: Heparin drip as above Dispo:Pending PT recommendations  in 2-3 days. Barriers include surgery tomorrow.   Subjective:  [name] reports that he feels "about the same" today as yesterday.  Says that he still has some pain in his left lower limb but "it does not matter because it will be gone tomorrow."  Denies any nausea, vomiting.  Answered all questions regarding  care.  Objective: Temp:  [97.5 F (36.4 C)-98.2 F (36.8 C)] 97.5 F (36.4 C) (07/21 0512) Pulse Rate:  [85-93] 89 (07/21 0512) Resp:  [16-18] 17 (07/21 0512) BP: (93-111)/(65-86) 111/86 (07/21 0512) SpO2:  [96 %-99 %] 96 % (07/21 0512) Physical Exam: General: 45 year old male appears stated age, sitting up in bed on telephone with relatives Cardiovascular: Regular rate, regular rhythm, no murmurs, rubs, or gallops Respiratory: Lungs clear to auscultation bilaterally, normal work of breathing on room air Abdomen: Soft, nontender, nondistended Extremities: Left lower extremity with no palpable distal pulses, cooler to touch compared to right, see photo in chart.  Wounds unchanged from previous exam  Laboratory: Recent Labs  Lab 05/21/21 1830 05/22/21 0822 05/23/21 0235  WBC 11.3* 8.8 9.4  HGB 16.9 15.3 14.2  HCT 51.5 47.3 43.7  PLT 295 275 226   Recent Labs  Lab 05/21/21 1830 05/22/21 0358 05/23/21 0235  NA 136 135 134*  K 4.5 3.4* 3.7  CL 100 102 105  CO2 25 25 22   BUN 10 12 16   CREATININE 0.83 0.85 1.07  CALCIUM 9.9 9.4 9.0  PROT 8.3*  --   --   BILITOT 0.7  --   --   ALKPHOS 107  --   --   ALT 25  --   --   AST 19  --   --   GLUCOSE 112* 132* 124*      Imaging/Diagnostic Tests: ABI consistent with critical limb ischemia of left lower extremity, right lower extremity within normal limits Graft duplex ultrasound with occluded left mid SFA-peroneal bypass  , MD 05/23/2021, 8:50 AM PGY-1, [facility] Family Medicine FPTS Intern  pager: [phone], text pages welcome

## 2021-05-23 NOTE — Progress Notes (Signed)
ANTICOAGULATION CONSULT NOTE - Follow Up Consult  Pharmacy Consult for IV Heparin Indication: Ischemic limb, hx splenic artery thrombosis  No Known Allergies  Patient Measurements: Height: 5\' 6"  (167.6 cm) Weight: 70 kg (154 lb 5.2 oz) IBW/kg (Calculated) : 63.8 Heparin Dosing Weight: 70 kg  Vital Signs: Temp: 98.2 F (36.8 C) (07/20 2146) Temp Source: Oral (07/20 2146) BP: 109/77 (07/20 2146) Pulse Rate: 92 (07/20 2146)  Labs: Recent Labs    05/21/21 1830 05/22/21 0358 05/22/21 0822 05/22/21 1600 05/23/21 0235  HGB 16.9  --  15.3  --  14.2  HCT 51.5  --  47.3  --  43.7  PLT 295  --  275  --  226  APTT  --   --  49* 51* 77*  LABPROT 14.3  --   --   --   --   INR 1.1  --   --   --   --   HEPARINUNFRC  --   --  0.54 0.55 0.74*  CREATININE 0.83 0.85  --   --   --     Estimated Creatinine Clearance: 97 mL/min (by C-G formula based on SCr of 0.85 mg/dL).   Assessment: 45 yr old man presented with foot pain/skin changes with hx of PAD, S/P bypass, on apixaban PTA for hx splenic artery thrombosis (last dose 7/19 AM). Duplex shows occluded LLE graft - may need L BKA. Given recent apixaban use, will monitor anticoagulation using aPTT until aPTT and heparin levels correlate.  Heparin level 0.74 (affected by apixaban), aPTT 77 sec (therapeutic) on gtt at 1500 units/hr. No bleeding noted.   Goal of Therapy:  Heparin level 0.3-0.7 units/ml aPTT 66-102 seconds Monitor platelets by anticoagulation protocol: Yes   Plan:  Continue heparin infusion at 1500 units/hr F/u 6 hr confirmatory aPTT  8/19, PharmD, BCPS Please see amion for complete clinical pharmacist phone list 05/23/2021 3:08 AM

## 2021-05-23 NOTE — Hospital Course (Addendum)
Evan Rodriguez is a 45 year old male with history of PAD, MI, CAD, and splenic artery thrombosis who presented with left foot pain and was subsequently found to have critical limb ischemia and overlying cellulitis.  Left Lower Limb Critical Ischemia and Cellulitis in the Setting of Peripheral Artery Disease Patient was evaluated in the ED on 7/19 for worsening left foot pain and nonhealing ulcers.  He was found to have no distal pulses, which was confirmed on bedside Doppler evaluation.  He was evaluated by our vascular surgeon colleagues who had placed a peroneal artery bypass graft in that leg in 04/22 and they suspected that the graft had become occluded.  This was confirmed on duplex ultrasonography of the graft and ABIs confirmed critical limb ischemia.  Vascular surgery felt that due to [name]'s chronic vasculopathy that he would not be a candidate for further vascular procedures and would require a below the knee amputation.  We treated his cellulitis with IV Zosyn and his blood cultures remained negative throughout his stay. 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  His postoperative course was uncomplicated and his pain was well controlled on oral narcotics.  He was able to ambulate with physical therapy using his orthopedic appliance prior to discharge.  He was discharged on 7/26 in stable condition.   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  We offered the first dose in the hospital but he declined.  He will need a 13 valent vaccine followed by the 23-valent pneumococcal vaccine in eight weeks to complete his vaccination series  2) [name] reported that he had no nicotine cravings with nicotine patch in the hospital.  Consider ongoing counseling and therapeutic alliance toward the end goal of smoking cessation. 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  We started him on gabapentin 100 mg 3 times daily for this.  Consider continuing and possibly titrating this to treat his pain.

## 2021-05-23 NOTE — Progress Notes (Addendum)
ANTICOAGULATION CONSULT NOTE - Follow Up Consult  Pharmacy Consult for IV Heparin Indication: Ischemic limb, hx splenic artery thrombosis  No Known Allergies  Patient Measurements: Height: 5\' 6"  (167.6 cm) Weight: 70 kg (154 lb 5.2 oz) IBW/kg (Calculated) : 63.8 Heparin Dosing Weight: 70 kg  Vital Signs: Temp: 97.5 F (36.4 C) (07/21 0512) Temp Source: Oral (07/21 0512) BP: 108/83 (07/21 0910) Pulse Rate: 92 (07/21 0910)  Labs: Recent Labs    05/21/21 1830 05/21/21 1830 05/22/21 0358 05/22/21 0822 05/22/21 1600 05/23/21 0235 05/23/21 0945  HGB 16.9  --   --  15.3  --  14.2  --   HCT 51.5  --   --  47.3  --  43.7  --   PLT 295  --   --  275  --  226  --   APTT  --    < >  --  49* 51* 77* 61*  LABPROT 14.3  --   --   --   --   --   --   INR 1.1  --   --   --   --   --   --   HEPARINUNFRC  --   --   --  0.54 0.55 0.74*  --   CREATININE 0.83  --  0.85  --   --  1.07  --    < > = values in this interval not displayed.    Estimated Creatinine Clearance: 77 mL/min (by C-G formula based on SCr of 1.07 mg/dL).   Assessment: 45 yr old man on apixaban PTA for hx splenic artery thrombosis (last dose 7/19 AM). Apixaban held for amputation, pharmacy consulted for heparin. Given recent apixaban use, will monitor anticoagulation using aPTT until aPTT and heparin levels correlate.  Heparin level 0.74 (affected by apixaban), aPTT 61 is subtherapeutic on 1500 units/hr. No bleeding noted. L BKA planned 7/22.  Goal of Therapy:  Heparin level 0.3-0.7 units/ml aPTT 66-102 seconds Monitor platelets by anticoagulation protocol: Yes   Plan:  Increase heparin infusion to 1600 units/hr F/u aPTT until correlates with heparin level  Monitor daily aPTT, HL, CBC/plt Monitor for signs/symptoms of bleeding  F/u restart apixaban    8/22, PharmD, BCPS, BCCP Clinical Pharmacist  Please check AMION for all [facility] Pharm[facility] phone numbers After 10:00 PM, call Main Pharmacy [phone]

## 2021-05-23 NOTE — Progress Notes (Signed)
Occupational Therapy Evaluation Patient Details Name: Evan Rodriguez MRN: [id number] DOB: [birth year] Today's Date: 05/23/2021    History of Present Illness 45 year old  presenting with pain and erythema of the left lower extremity consistent with progression of his cellulitis and worsening vascular disease (PVD with ulcer L foot). 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 L BKA scheduled on tomorrrow 05/24/21.   Clinical Impression   Pt presents with above diagnosis. PTA pt PLOF living at home with family ( 2 daughters) with assistance from son as well at discharge. Pt reports living in one level, ramped entrance, with AE (listed below) reports access to this equipment due to late fiance's B amputation hx. 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 Pt scheduled for L BKA on 05/24/21. OT will further assess after procedure to evaluate level of function in areas of functional static/dynamic sitting and standing balance and safety awareness with ADL engagement. DC recommendation currently for HHOT, however, dependent on medical complexities and L LE management.     Follow Up Recommendations  Home health OT    Equipment Recommendations  Tub/shower bench    Recommendations for Other Services       Precautions / Restrictions Precautions Precautions: None Restrictions Weight Bearing Restrictions: No      Mobility Bed Mobility Overal bed mobility: Independent                  Transfers Overall transfer level: Independent Equipment used: None                  Balance Overall balance assessment: Independent                                          ADL either performed or assessed with clinical judgement   ADL Overall ADL's : At baseline                                       General ADL Comments: No physical assistance required during this initial evaluation, however, reports pain when bearing weight on L foot. Reports skin sensitivity when donning socks. Will further assess with functional tasks after procedure on 05/24/21.     Vision Baseline Vision/History: No visual deficits       Perception     Praxis      Pertinent Vitals/Pain Pain Assessment: 0-10 Pain Score: 5  Pain Location: L foot Pain Descriptors / Indicators: Aching;Constant Pain Intervention(s): Monitored during session;Repositioned     Hand Dominance Left   Extremity/Trunk Assessment Upper Extremity Assessment Upper Extremity Assessment: Overall WFL for tasks assessed   Lower Extremity Assessment Lower Extremity Assessment: Defer to PT evaluation   Cervical / Trunk Assessment Cervical / Trunk Assessment: Normal   Communication Communication Communication: No difficulties   Cognition Arousal/Alertness: Awake/alert Behavior During Therapy: WFL for tasks assessed/performed Overall Cognitive Status: Within Functional Limits for tasks assessed  General Comments  Educated pt on plan for OT with education of AE, functional mobility, and safety with ADL engagement during course of hospital stay once procedure is completed.    Exercises     Shoulder Instructions      Home Living Family/patient expects to be discharged to:: Private residence Living Arrangements: Children Available Help at Discharge: Family;Available PRN/intermittently Type of Home: Mobile home Home Access: Ramped entrance     Home Layout: One level     Bathroom Shower/Tub: Chief Strategy Officer: Standard     Home Equipment: Environmental consultant - 2  wheels;Wheelchair - manual;Shower seat   Additional Comments: Pt reports his children are 100 and 64 yo.      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, his goal is to get back to work        OT Problem List: Pain;Impaired sensation;Decreased safety awareness;Decreased knowledge of use of DME or AE;Decreased knowledge of precautions      OT Treatment/Interventions: Self-care/ADL training;Therapeutic exercise;DME and/or AE instruction;Therapeutic activities;Patient/family education;Balance training    OT Goals(Current goals can be found in the care plan section) Acute Rehab OT Goals Patient Stated Goal: He wants definitive plan for L foot, and to return to work OT Goal Formulation: With patient Time For Goal Achievement: 06/06/21 Potential to Achieve Goals: Good  OT Frequency: Min 2X/week   Barriers to D/C:            Co-evaluation              AM-PAC OT "6 Clicks" Daily Activity     Outcome Measure Help from another person eating meals?: None Help from another person taking care of personal grooming?: None Help from another person toileting, which includes using toliet, bedpan, or urinal?: None Help from another person bathing (including washing, rinsing, drying)?: None Help from another person to put on and taking off regular upper body clothing?: None Help from another person to put on and taking off regular lower body clothing?: None 6 Click Score: 24   End of Session Nurse Communication: Mobility status  Activity Tolerance: Patient limited by pain Patient left: in chair;with call bell/phone within reach;with nursing/sitter in room  OT Visit Diagnosis: Pain                Time: 8280-0349 OT Time Calculation (min): 19 min Charges:  OT General Charges $OT Visit: 1 Visit OT Evaluation $OT Eval Low Complexity: 1 Low  [place]quette Old, MSOT, OTR/L  Supplemental Rehabilitation  Services  [phone]   Zigmund Daniel 05/23/2021, 11:04 AM

## 2021-05-24 ENCOUNTER — Encounter (HOSPITAL_COMMUNITY)
Admission: EM | Disposition: A | Discharge status: Home Health | Payer: Self-pay | Source: Home / Self Care | Attending: Family Medicine

## 2021-05-24 ENCOUNTER — Inpatient Hospital Stay (HOSPITAL_COMMUNITY): Payer: Medicaid Other | Admitting: Anesthesiology

## 2021-05-24 ENCOUNTER — Encounter (HOSPITAL_COMMUNITY): Payer: Self-pay | Admitting: Family Medicine

## 2021-05-24 DIAGNOSIS — I70222 Atherosclerosis of native arteries of extremities with rest pain, left leg: Secondary | ICD-10-CM

## 2021-05-24 DIAGNOSIS — Z0000 Encounter for general adult medical examination without abnormal findings: Secondary | ICD-10-CM

## 2021-05-24 HISTORY — PX: AMPUTATION: SHX166

## 2021-05-24 LAB — APTT: aPTT: 99 seconds — ABNORMAL HIGH (ref 24–36)

## 2021-05-24 LAB — HEPARIN LEVEL (UNFRACTIONATED): Heparin Unfractionated: 0.55 IU/mL (ref 0.30–0.70)

## 2021-05-24 LAB — CBC
HCT: 42.4 % (ref 39.0–52.0)
Hemoglobin: 13.9 g/dL (ref 13.0–17.0)
MCH: 29.1 pg (ref 26.0–34.0)
MCHC: 32.8 g/dL (ref 30.0–36.0)
MCV: 88.9 fL (ref 80.0–100.0)
Platelets: 264 10*3/uL (ref 150–400)
RBC: 4.77 MIL/uL (ref 4.22–5.81)
RDW: 15.4 % (ref 11.5–15.5)
WBC: 8.8 10*3/uL (ref 4.0–10.5)
nRBC: 0 % (ref 0.0–0.2)

## 2021-05-24 LAB — GLUCOSE, CAPILLARY
Glucose-Capillary: 108 mg/dL — ABNORMAL HIGH (ref 70–99)
Glucose-Capillary: 131 mg/dL — ABNORMAL HIGH (ref 70–99)

## 2021-05-24 LAB — SURGICAL PCR SCREEN
MRSA, PCR: NEGATIVE
Staphylococcus aureus: NEGATIVE

## 2021-05-24 SURGERY — AMPUTATION BELOW KNEE
Anesthesia: General | Site: Knee | Laterality: Left

## 2021-05-24 MED ORDER — 0.9 % SODIUM CHLORIDE (POUR BTL) OPTIME
TOPICAL | Status: DC | PRN
  Administered 2021-05-24: 1000 mL

## 2021-05-24 MED ORDER — MIDAZOLAM HCL 5 MG/5ML IJ SOLN
INTRAMUSCULAR | Status: DC | PRN
  Administered 2021-05-24: 2 mg via INTRAVENOUS

## 2021-05-24 MED ORDER — HYDROMORPHONE HCL 1 MG/ML IJ SOLN
0.2500 mg | INTRAMUSCULAR | Status: DC | PRN
  Administered 2021-05-24 (×2): 0.5 mg via INTRAVENOUS

## 2021-05-24 MED ORDER — PHENYLEPHRINE HCL-NACL 10-0.9 MG/250ML-% IV SOLN
INTRAVENOUS | Status: DC | PRN
  Administered 2021-05-24: 75 ug/min via INTRAVENOUS

## 2021-05-24 MED ORDER — SUCCINYLCHOLINE CHLORIDE 200 MG/10ML IV SOSY
PREFILLED_SYRINGE | INTRAVENOUS | Status: AC
  Filled 2021-05-24: qty 10

## 2021-05-24 MED ORDER — APIXABAN 5 MG PO TABS
5.0000 mg | ORAL_TABLET | Freq: Two times a day (BID) | ORAL | Status: DC
  Administered 2021-05-25 – 2021-05-28 (×6): 5 mg via ORAL
  Filled 2021-05-24 (×7): qty 1

## 2021-05-24 MED ORDER — FENTANYL CITRATE (PF) 100 MCG/2ML IJ SOLN
INTRAMUSCULAR | Status: DC | PRN
  Administered 2021-05-24: 50 ug via INTRAVENOUS
  Administered 2021-05-24 (×2): 25 ug via INTRAVENOUS
  Administered 2021-05-24 (×2): 50 ug via INTRAVENOUS

## 2021-05-24 MED ORDER — FENTANYL CITRATE (PF) 250 MCG/5ML IJ SOLN
INTRAMUSCULAR | Status: AC
  Filled 2021-05-24: qty 5

## 2021-05-24 MED ORDER — FENTANYL CITRATE (PF) 100 MCG/2ML IJ SOLN: 50.0000 ug | INTRAMUSCULAR | Status: DC | PRN

## 2021-05-24 MED ORDER — OXYCODONE HCL 5 MG PO TABS
5.0000 mg | ORAL_TABLET | ORAL | Status: DC | PRN
  Administered 2021-05-24 – 2021-05-27 (×10): 10 mg via ORAL
  Filled 2021-05-24 (×10): qty 2

## 2021-05-24 MED ORDER — FENTANYL CITRATE (PF) 100 MCG/2ML IJ SOLN
INTRAMUSCULAR | Status: AC
  Administered 2021-05-24: 50 ug via INTRAVENOUS
  Filled 2021-05-24: qty 2

## 2021-05-24 MED ORDER — OXYCODONE HCL 5 MG/5ML PO SOLN: 5.0000 mg | Freq: Once | ORAL | Status: DC | PRN

## 2021-05-24 MED ORDER — ONDANSETRON HCL 4 MG/2ML IJ SOLN: 4.0000 mg | Freq: Four times a day (QID) | INTRAMUSCULAR | Status: DC | PRN

## 2021-05-24 MED ORDER — ROCURONIUM BROMIDE 10 MG/ML (PF) SYRINGE
PREFILLED_SYRINGE | INTRAVENOUS | Status: AC
  Filled 2021-05-24: qty 30

## 2021-05-24 MED ORDER — ONDANSETRON HCL 4 MG/2ML IJ SOLN
INTRAMUSCULAR | Status: AC
  Filled 2021-05-24: qty 4

## 2021-05-24 MED ORDER — BACITRACIN ZINC 500 UNIT/GM EX OINT
TOPICAL_OINTMENT | CUTANEOUS | Status: DC | PRN
  Administered 2021-05-24: 1 via TOPICAL

## 2021-05-24 MED ORDER — LIDOCAINE 2% (20 MG/ML) 5 ML SYRINGE
INTRAMUSCULAR | Status: AC
  Filled 2021-05-24: qty 15

## 2021-05-24 MED ORDER — CHLORHEXIDINE GLUCONATE 0.12 % MT SOLN
OROMUCOSAL | Status: AC
  Administered 2021-05-24: 15 mL via OROMUCOSAL
  Filled 2021-05-24: qty 15

## 2021-05-24 MED ORDER — MORPHINE SULFATE (PF) 4 MG/ML IV SOLN
4.0000 mg | INTRAVENOUS | Status: DC | PRN
  Administered 2021-05-24 – 2021-05-25 (×3): 4 mg via INTRAVENOUS
  Filled 2021-05-24 (×3): qty 1

## 2021-05-24 MED ORDER — LACTATED RINGERS IV SOLN: INTRAVENOUS | Status: DC

## 2021-05-24 MED ORDER — OXYCODONE HCL 5 MG PO TABS: 5.0000 mg | ORAL_TABLET | Freq: Once | ORAL | Status: DC | PRN

## 2021-05-24 MED ORDER — EPINEPHRINE 1 MG/10ML IJ SOSY
PREFILLED_SYRINGE | INTRAMUSCULAR | Status: AC
  Filled 2021-05-24: qty 10

## 2021-05-24 MED ORDER — LIDOCAINE HCL (CARDIAC) PF 100 MG/5ML IV SOSY
PREFILLED_SYRINGE | INTRAVENOUS | Status: DC | PRN
  Administered 2021-05-24: 30 mg via INTRAVENOUS

## 2021-05-24 MED ORDER — LACTATED RINGERS IV SOLN: INTRAVENOUS | Status: DC | PRN

## 2021-05-24 MED ORDER — PROPOFOL 10 MG/ML IV BOLUS
INTRAVENOUS | Status: DC | PRN
  Administered 2021-05-24: 140 mg via INTRAVENOUS

## 2021-05-24 MED ORDER — ORAL CARE MOUTH RINSE: 15.0000 mL | Freq: Once | OROMUCOSAL | Status: AC

## 2021-05-24 MED ORDER — PHENYLEPHRINE 40 MCG/ML (10ML) SYRINGE FOR IV PUSH (FOR BLOOD PRESSURE SUPPORT)
PREFILLED_SYRINGE | INTRAVENOUS | Status: AC
  Filled 2021-05-24: qty 20

## 2021-05-24 MED ORDER — PHENYLEPHRINE HCL (PRESSORS) 10 MG/ML IV SOLN
INTRAVENOUS | Status: DC | PRN
  Administered 2021-05-24: 200 ug via INTRAVENOUS

## 2021-05-24 MED ORDER — LIDOCAINE 2% (20 MG/ML) 5 ML SYRINGE
INTRAMUSCULAR | Status: DC | PRN
  Administered 2021-05-24: 30 mg via INTRAVENOUS

## 2021-05-24 MED ORDER — MIDAZOLAM HCL 2 MG/2ML IJ SOLN
INTRAMUSCULAR | Status: AC
  Filled 2021-05-24: qty 2

## 2021-05-24 MED ORDER — CHLORHEXIDINE GLUCONATE 0.12 % MT SOLN: 15.0000 mL | Freq: Once | OROMUCOSAL | Status: AC

## 2021-05-24 MED ORDER — HYDROMORPHONE HCL 1 MG/ML IJ SOLN
INTRAMUSCULAR | Status: AC
  Filled 2021-05-24: qty 1

## 2021-05-24 MED ORDER — BACITRACIN ZINC 500 UNIT/GM EX OINT
TOPICAL_OINTMENT | CUTANEOUS | Status: AC
  Filled 2021-05-24: qty 28.35

## 2021-05-24 SURGICAL SUPPLY — 51 items
BAG COUNTER SPONGE SURGICOUNT (BAG) ×2 IMPLANT
BANDAGE ESMARK 6X9 LF (GAUZE/BANDAGES/DRESSINGS) ×1 IMPLANT
BLADE SAW RECIP 87.9 MT (BLADE) ×2 IMPLANT
BNDG COHESIVE 6X5 TAN ST LF (GAUZE/BANDAGES/DRESSINGS) ×1 IMPLANT
BNDG COHESIVE 6X5 TAN STRL LF (GAUZE/BANDAGES/DRESSINGS) ×2 IMPLANT
BNDG ELASTIC 4X5.8 VLCR STR LF (GAUZE/BANDAGES/DRESSINGS) ×3 IMPLANT
BNDG ESMARK 6X9 LF (GAUZE/BANDAGES/DRESSINGS) ×2
BNDG GAUZE ELAST 4 BULKY (GAUZE/BANDAGES/DRESSINGS) ×3 IMPLANT
CANISTER SUCT 3000ML PPV (MISCELLANEOUS) ×4 IMPLANT
CLIP VESOCCLUDE MED 6/CT (CLIP) IMPLANT
COVER SURGICAL LIGHT HANDLE (MISCELLANEOUS) ×2 IMPLANT
CUFF TOURN SGL QUICK 24 (TOURNIQUET CUFF)
CUFF TOURN SGL QUICK 34 (TOURNIQUET CUFF)
CUFF TOURN SGL QUICK 42 (TOURNIQUET CUFF) IMPLANT
CUFF TRNQT CYL 24X4X16.5-23 (TOURNIQUET CUFF) IMPLANT
CUFF TRNQT CYL 34X4.125X (TOURNIQUET CUFF) IMPLANT
DRAIN CHANNEL 19F RND (DRAIN) IMPLANT
DRAPE HALF SHEET 40X57 (DRAPES) ×2 IMPLANT
DRAPE ORTHO SPLIT 77X108 STRL (DRAPES) ×2
DRAPE SURG ORHT 6 SPLT 77X108 (DRAPES) ×2 IMPLANT
DRAPE U-SHAPE 47X51 STRL (DRAPES) ×2 IMPLANT
DRSG ADAPTIC 3X8 NADH LF (GAUZE/BANDAGES/DRESSINGS) ×2 IMPLANT
ELECT REM PT RETURN 9FT ADLT (ELECTROSURGICAL) ×2
ELECTRODE REM PT RTRN 9FT ADLT (ELECTROSURGICAL) ×1 IMPLANT
EVACUATOR SILICONE 100CC (DRAIN) IMPLANT
GAUZE SPONGE 4X4 12PLY STRL (GAUZE/BANDAGES/DRESSINGS) ×3 IMPLANT
GLOVE SRG 8 PF TXTR STRL LF DI (GLOVE) ×1 IMPLANT
GLOVE SURG ENC MOIS LTX SZ7.5 (GLOVE) ×2 IMPLANT
GLOVE SURG UNDER POLY LF SZ8 (GLOVE) ×1
GOWN STRL REUS W/ TWL LRG LVL3 (GOWN DISPOSABLE) ×3 IMPLANT
GOWN STRL REUS W/TWL LRG LVL3 (GOWN DISPOSABLE) ×3
KIT BASIN OR (CUSTOM PROCEDURE TRAY) ×2 IMPLANT
KIT TURNOVER KIT B (KITS) ×2 IMPLANT
NS IRRIG 1000ML POUR BTL (IV SOLUTION) ×2 IMPLANT
PACK GENERAL/GYN (CUSTOM PROCEDURE TRAY) ×2 IMPLANT
PAD ARMBOARD 7.5X6 YLW CONV (MISCELLANEOUS) ×4 IMPLANT
RASP HELIOCORDIAL MED (MISCELLANEOUS) IMPLANT
SPONGE T-LAP 18X18 ~~LOC~~+RFID (SPONGE) ×2 IMPLANT
STAPLER VISISTAT (STAPLE) ×2 IMPLANT
STOCKINETTE IMPERVIOUS LG (DRAPES) ×2 IMPLANT
SUT ETHILON 3 0 PS 1 (SUTURE) IMPLANT
SUT SILK 0 TIES 10X30 (SUTURE) ×2 IMPLANT
SUT SILK 2 0 (SUTURE) ×2
SUT SILK 2 0 SH CR/8 (SUTURE) ×3 IMPLANT
SUT SILK 2-0 18XBRD TIE 12 (SUTURE) ×1 IMPLANT
SUT SILK 3 0 (SUTURE) ×2
SUT SILK 3-0 18XBRD TIE 12 (SUTURE) ×1 IMPLANT
SUT VIC AB 2-0 CT1 18 (SUTURE) ×2 IMPLANT
TOWEL GREEN STERILE (TOWEL DISPOSABLE) ×4 IMPLANT
UNDERPAD 30X36 HEAVY ABSORB (UNDERPADS AND DIAPERS) ×2 IMPLANT
WATER STERILE IRR 1000ML POUR (IV SOLUTION) ×2 IMPLANT

## 2021-05-24 NOTE — Anesthesia Preprocedure Evaluation (Signed)
Anesthesia Evaluation  Patient identified by MRN, date of birth, ID band Patient awake    Reviewed: Allergy & Precautions, H&P , NPO status , Patient's Chart, lab work & pertinent test results  Airway Mallampati: II   Neck ROM: full    Dental   Pulmonary Current Smoker and Patient abstained from smoking.,    breath sounds clear to auscultation       Cardiovascular + CAD, + Past MI, + Cardiac Stents and + Peripheral Vascular Disease   Rhythm:regular Rate:Normal     Neuro/Psych    GI/Hepatic negative GI ROS,   Endo/Other    Renal/GU      Musculoskeletal   Abdominal   Peds  Hematology   Anesthesia Other Findings   Reproductive/Obstetrics                             Anesthesia Physical Anesthesia Plan  ASA: 3  Anesthesia Plan: General   Post-op Pain Management:    Induction: Intravenous  PONV Risk Score and Plan: 1 and Ondansetron, Dexamethasone, Midazolam and Treatment may vary due to age or medical condition  Airway Management Planned: LMA  Additional Equipment:   Intra-op Plan:   Post-operative Plan: Extubation in OR  Informed Consent: I have reviewed the patients History and Physical, chart, labs and discussed the procedure including the risks, benefits and alternatives for the proposed anesthesia with the patient or authorized representative who has indicated his/her understanding and acceptance.     Dental advisory given  Plan Discussed with: CRNA, Anesthesiologist and Surgeon  Anesthesia Plan Comments:         Anesthesia Quick Evaluation

## 2021-05-24 NOTE — Interval H&P Note (Signed)
History and Physical Interval Note:  05/24/2021 2:11 PM  Evan Rodriguez  has presented today for surgery, with the diagnosis of Critical Limb Ischemia and Left Foot Infection.  The various methods of treatment have been discussed with the patient and family. After consideration of risks, benefits and other options for treatment, the patient has consented to  Procedure(s): LEFT LEG BELOW KNEE AMPUTATION (Left) as a surgical intervention.  The patient's history has been reviewed, patient examined, no change in status, stable for surgery.  I have reviewed the patient's chart and labs.  Questions were answered to the patient's satisfaction.     [name]

## 2021-05-24 NOTE — Plan of Care (Signed)
  Problem: Education: Goal: Knowledge of General Education information will improve Description: Including pain rating scale, medication(s)/side effects and non-pharmacologic comfort measures Outcome: Progressing   Problem: Pain Managment: Goal: General experience of comfort will improve Outcome: Progressing   

## 2021-05-24 NOTE — Anesthesia Procedure Notes (Signed)
Procedure Name: LMA Insertion Date/Time: 05/24/2021 3:16 PM Performed by: Gwenyth Allegra, CRNA Pre-anesthesia Checklist: Emergency Drugs available, Patient identified, Suction available, Patient being monitored and Timeout performed Patient Re-evaluated:Patient Re-evaluated prior to induction Oxygen Delivery Method: Circle system utilized Preoxygenation: Pre-oxygenation with 100% oxygen Induction Type: IV induction LMA Size: 4.0 Placement Confirmation: positive ETCO2 and breath sounds checked- equal and bilateral Tube secured with: Tape Dental Injury: Teeth and Oropharynx as per pre-operative assessment

## 2021-05-24 NOTE — Plan of Care (Signed)
  Problem: Activity: Goal: Risk for activity intolerance will decrease Outcome: Progressing   

## 2021-05-24 NOTE — Progress Notes (Signed)
ANTICOAGULATION CONSULT NOTE - Follow Up Consult  Pharmacy Consult for Eliquis Indication:  Ischemic limb, hx splenic artery thrombosis  No Known Allergies  Patient Measurements: Height: 5\' 6"  (167.6 cm) Weight: 71.8 kg (158 lb 4.6 oz) IBW/kg (Calculated) : 63.8  Vital Signs: Temp: 97.6 F (36.4 C) (07/22 1806) Temp Source: Oral (07/22 1806) BP: 127/90 (07/22 1806) Pulse Rate: 79 (07/22 1806)  Labs: Recent Labs    05/22/21 0358 05/22/21 0822 05/22/21 0822 05/22/21 1600 05/23/21 0235 05/23/21 0945 05/24/21 0215  HGB  --  15.3   < >  --  14.2  --  13.9  HCT  --  47.3  --   --  43.7  --  42.4  PLT  --  275  --   --  226  --  264  APTT  --  49*   < > 51* 77* 61* 99*  HEPARINUNFRC  --  0.54   < > 0.55 0.74*  --  0.55  CREATININE 0.85  --   --   --  1.07  --   --    < > = values in this interval not displayed.    Estimated Creatinine Clearance: 77 mL/min (by C-G formula based on SCr of 1.07 mg/dL).  Assessment: 45 yr old man on Eliquis PTA for hx splenic artery thrombosis (last dose 7/19 AM). Eliquis held for amputation, pharmacy previously consulted for IV heparin dosing.    Heparin stopped this am for L BKA this afternoon. Post-op request to resume Apixaban 7/23 afternoon.  Anesthesia end time 1643 today.  Goal of Therapy:  Appropriate Eliquis dose for indication Monitor platelets by anticoagulation protocol: Yes   Plan:  Resume Eliquis 5 mg BID on 7/23 at 6pm, about 24 hours post-op.  Then 10am/10pm per usual BID dosing. Monitor of sign/symptoms of bleeding.  8/23, RPh 05/24/2021,6:40 PM

## 2021-05-24 NOTE — Op Note (Signed)
    NAME: Evan Rodriguez    MRN: [id number] DOB: Sep 07, 1976    DATE OF OPERATION: 05/24/2021  PREOP DIAGNOSIS:    Ischemic left foot  POSTOP DIAGNOSIS:    Same  PROCEDURE:    Left below the knee amputation  SURGEON: Di Kindle. Edilia Bo, MD  ASSIST: Clinton Gallant, PA  ANESTHESIA: General  EBL: Minimal  INDICATIONS:    Evan Rodriguez is a 45 y.o. male who presented with an ischemic left lower extremity with no further options for revascularization.  We discussed tobacco cessation and felt that there was some chance the limb would be salvageable.  However he felt strongly that he would not be able to quit smoking and wished to proceed with amputation.  FINDINGS:   The muscle  had excellent perfusion the below the knee level.  TECHNIQUE:   Patient was taken to the operating room and received a general anesthetic.  The left lower extremity was prepped and draped in usual sterile fashion.  Tourniquet was placed on the upper thigh.  The circumference of the limb was measured 10 cm distal to the tibial tuberosity and two thirds of this distance was used to mark the anterior incision.  The long posterior flap of equal length was marked.  Leg was exsanguinated with an Esmarch bandage and the tourniquet inflated to 300 mmHg.  Incision was then carried down through the skin subcutaneous tissue muscle and fascia to the tibia and fibula which were dissected free circumferentially.  The anterior aspect of the tibia was beveled and the bone was divided proximal to the level of skin division.  The fibula was divided proximal to the level of skin division.  The arteries and veins were individually suture ligated with 2-0 silk ties.  The tourniquet was then released.  Additional hemostasis was obtained using electrocautery.  The edges of the bone were rasped.  The wound was irrigated with copious amounts of saline.  The fascial layer was then closed with interrupted 2-0 Vicryl's.  The skin was closed  with staples.  Sterile dressing was applied.  The patient tolerated procedure well was transferred to the recovery room in stable condition.  All needle and sponge counts were correct.  Given the complexity of the case a first assistant was necessary in order to expedient the procedure and safely perform the technical aspects of the operation.  Evan Ferrari, MD, FACS [facility]  DATE OF DICTATION:   05/24/2021

## 2021-05-24 NOTE — Transfer of Care (Signed)
Immediate Anesthesia Transfer of Care Note  Patient: Evan Rodriguez  Procedure(s) Performed: LEFT LEG BELOW KNEE AMPUTATION (Left: Knee)  Patient Location: PACU  Anesthesia Type:General  Level of Consciousness: drowsy and patient cooperative  Airway & Oxygen Therapy: Patient Spontanous Breathing  Post-op Assessment: Report given to RN and Post -op Vital signs reviewed and stable  Post vital signs: Reviewed and stable  Last Vitals:  Vitals Value Taken Time  BP    Temp    Pulse 95 05/24/21 1642  Resp 15 05/24/21 1642  SpO2 96 % 05/24/21 1642  Vitals shown include unvalidated device data.  Last Pain:  Vitals:   05/24/21 1415  TempSrc:   PainSc: 0-No pain      Patients Stated Pain Goal: 5 (05/24/21 1354)  Complications: No notable events documented.

## 2021-05-24 NOTE — Progress Notes (Signed)
Occupational Therapy Treatment Patient Details Name: Evan Rodriguez MRN: [id number] DOB: May 01, 1976 Today's Date: 05/24/2021    History of present illness 45 year old  presenting with pain and erythema of the left lower extremity consistent with progression of his cellulitis and worsening vascular disease. 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 OT provided education on what transfers may look like s/p amputation. Pt practices using RW and hopping to stand and transfer, reporting that pain is getting too high with OOB mobility at this time. Acute OT will follow up and re-evaluate.    Follow Up Recommendations  Home health OT    Equipment Recommendations  Tub/shower bench    Recommendations for Other Services      Precautions / Restrictions Precautions Precautions: None Restrictions Weight Bearing Restrictions: No       Mobility Bed Mobility Overal bed mobility: Independent                  Transfers Overall transfer level: Independent Equipment used: None             General transfer comment: Sit<>Stand from EOB, attempting 1 foot transfers    Balance Overall balance assessment: Independent                                         ADL either performed or assessed with clinical judgement   ADL Overall ADL's : Modified independent;At baseline                                       General ADL Comments: No physical assistance required during this initial evaluation, however, reports pain when bearing weight on L foot. Reports skin sensitivity when donning socks. Will further assess with  functional tasks after procedure on 05/24/21.     Vision   Vision Assessment?: No apparent visual deficits   Perception     Praxis      Cognition Arousal/Alertness: Awake/alert Behavior During Therapy: WFL for tasks assessed/performed Overall Cognitive Status: Within Functional Limits for tasks assessed                                          Exercises Exercises: Other exercises Other Exercises Other Exercises: tricep extensions on RW to prepare for hopping after amputation, x 10   Shoulder Instructions       General Comments Educated on transfers for bathing and OOB mobility, as well as care for the residual limb and the importance of straightening and mobilizing the knee to prepare for a prosthesis. Additionally, pt was educated on desensitization of the residual limb.    Pertinent Vitals/ Pain       Pain Assessment: Faces Faces Pain Scale: Hurts even more Pain Location: L foot Pain Descriptors / Indicators: Aching;Constant Pain Intervention(s): Limited activity within patient's tolerance;Monitored during session  Home Living  Prior Functioning/Environment              Frequency  Min 2X/week        Progress Toward Goals  OT Goals(current goals can now be found in the care plan section)  Progress towards OT goals: OT to reassess next treatment  Acute Rehab OT Goals Patient Stated Goal: To decrease pain OT Goal Formulation: With patient Time For Goal Achievement: 06/06/21 Potential to Achieve Goals: Good ADL Goals Pt Will Perform Upper Body Dressing: with modified independence;with adaptive equipment;sitting Pt Will Perform Lower Body Dressing: with modified independence;with adaptive equipment;sit to/from stand;sitting/lateral leans Pt Will Transfer to Toilet: with supervision;stand pivot transfer;bedside commode Pt Will Perform Toileting - Clothing Manipulation and hygiene:  with modified independence;with adaptive equipment;sit to/from stand Pt Will Perform Tub/Shower Transfer: Tub transfer;tub bench;rolling walker;Stand pivot transfer;with supervision  Plan Discharge plan remains appropriate;Frequency remains appropriate    Co-evaluation                 AM-PAC OT "6 Clicks" Daily Activity     Outcome Measure   Help from another person eating meals?: None Help from another person taking care of personal grooming?: None Help from another person toileting, which includes using toliet, bedpan, or urinal?: None Help from another person bathing (including washing, rinsing, drying)?: None Help from another person to put on and taking off regular upper body clothing?: None Help from another person to put on and taking off regular lower body clothing?: None 6 Click Score: 24    End of Session    OT Visit Diagnosis: Pain   Activity Tolerance Patient limited by pain   Patient Left in bed;with call bell/phone within reach   Nurse Communication Mobility status        Time: 1209-1223 OT Time Calculation (min): 14 min  Charges: OT General Charges $OT Visit: 1 Visit OT Treatments $Therapeutic Activity: 8-22 mins  Isamar Nazir H., OTR/L Acute Rehabilitation  Myan Locatelli Elane Bing Plume 05/24/2021, 12:56 PM

## 2021-05-24 NOTE — Progress Notes (Signed)
Family Medicine Teaching Service Daily Progress Note Intern Pager: [phone]  Patient name: Evan Rodriguez Medical record number: [id number] Date of birth: 1976/09/06 Age: 45 y.o. 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   LLE cellulitis  LLE ischemia PAD S/p L peroneal artery bypass graft (02/2021) Scheduled for OR today for L BKA. Blood cultures remain without growth. Afebrile. No white count.  - OR with Dr. 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  - Holding Eliquis ahead of surgery, currently on heparin gtt - Continue statin, ASA - Pneumococcal 13-valent vaccine tomorrow  Tobacco use Increased nicotine patch dose overnight. -Continue patch  FEN/GI: NPO for surgery, may return to heart healthy diet post-operatively PPx: Heparin drip Dispo:Pending PT recommendations  tomorrow. Barriers include surgery today, further PT evaluation.   Subjective:  Evan Rodriguez reports that he is still having pain in his left leg that "it is fine because they are about to take it off."  He has no questions about his surgery today.  Objective: Temp:  [98 F (36.7 C)-98.1 F (36.7 C)] 98.1 F (36.7 C) (07/22 0458) Pulse Rate:  [79-92] 86 (07/22 0458) Resp:  [16-20] 16 (07/22 0458) BP: (108-123)/(75-89) 123/89 (07/22 0458) SpO2:  [98 %-100 %] 98 % (07/22 0458) Weight:  [71.8 kg] 71.8 kg (07/21  2202) Physical Exam: General: Well-appearing, lying in bed, NAD Cardiovascular: Regular rate, regular rhythm, no murmurs, rubs, or gallops Respiratory: Lungs clear to auscultation anteriorly, patient defers posterior lung exam Abdomen: Soft, nontender, nondistended Extremities: Left lower extremity remains cooler than right and without distal pulses, wounds unchanged from previous  Laboratory: Recent Labs  Lab 05/22/21 0822 05/23/21 0235 05/24/21 0215  WBC 8.8 9.4 8.8  HGB 15.3 14.2 13.9  HCT 47.3 43.7 42.4  PLT 275 226 264   Recent Labs  Lab 05/21/21 1830 05/22/21 0358 05/23/21 0235  NA 136 135 134*  K 4.5 3.4* 3.7  CL 100 102 105  CO2 25 25 22   BUN 10 12 16   CREATININE 0.83 0.85 1.07  CALCIUM 9.9 9.4 9.0  PROT 8.3*  --   --   BILITOT 0.7  --   --   ALKPHOS 107  --   --   ALT 25  --   --   AST 19  --   --   GLUCOSE 112* 132* 124*     Imaging/Diagnostic Tests: No new imaging or tests  , MD 05/24/2021, 8:49 AM PGY-1, [facility] FPTS Intern pager: [phone], text pages welcome

## 2021-05-24 NOTE — Progress Notes (Signed)
Physical Therapy Treatment Patient Details Name: Evan Rodriguez MRN: [id number] DOB: December 27, 1975 Today's Date: 05/24/2021    History of Present Illness 45 year old  presenting with pain and erythema of the left lower extremity consistent with progression of his cellulitis and worsening vascular disease. 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 Pt agreeable to working with therapy. Focus of session education on phantom pains, mobility after amputation and preparation for prosthetic. Pt is currently independent with all mobility. PT will follow back after sx for reassessment of discharge location and pt needs.     Follow Up Recommendations  Other (comment);Home health PT (pending sucessful amputation)     Equipment Recommendations  None recommended by PT (has necessary equipment)       Precautions / Restrictions Precautions Precautions: None Restrictions Weight Bearing Restrictions: No    Mobility  Bed Mobility Overal bed mobility: Independent                  Transfers Overall transfer level: Independent Equipment used: None                Ambulation/Gait Ambulation/Gait assistance: Independent   Assistive device: None Gait Pattern/deviations: Step-through pattern     General Gait Details: Painful L foot, but no difficulty walking         Balance Overall balance assessment: Independent                                          Cognition Arousal/Alertness: Awake/alert Behavior During Therapy: WFL for tasks assessed/performed Overall Cognitive Status: Within Functional Limits for tasks assessed                                         Exercises Other Exercises Other Exercises: tricep extensions on RW to prepare for hopping after amputation, x 10    General Comments General comments (skin integrity, edema, etc.): Educated on mobility post amputation, phantom pains and prep for prosthesis      Pertinent Vitals/Pain Pain Assessment: Faces Faces Pain Scale: Hurts even more Pain Location: L foot Pain Descriptors / Indicators: Aching;Constant Pain Intervention(s): Limited activity within patient's tolerance;Monitored during session;Repositioned     PT Goals (current goals can now be found in the care plan section) Acute Rehab PT Goals PT Goal Formulation: With patient Time For Goal Achievement: 06/05/21 Potential to Achieve Goals: Good Progress towards PT goals: Progressing toward goals    Frequency           PT Plan Current plan remains appropriate       AM-PAC PT "6 Clicks" Mobility   Outcome Measure  Help needed turning from your back to your side while in a flat bed without using bedrails?: None Help needed moving from lying on your back to sitting on the side of a flat bed without using bedrails?: None Help needed moving to and from a bed to a chair (including a wheelchair)?: None Help needed standing  up from a chair using your arms (e.g., wheelchair or bedside chair)?: None Help needed to walk in hospital room?: None Help needed climbing 3-5 steps with a railing? : None 6 Click Score: 24    End of Session   Activity Tolerance: Patient tolerated treatment well Patient left: in bed;with call bell/phone within reach Nurse Communication: Mobility status PT Visit Diagnosis: Other abnormalities of gait and mobility (R26.89);Pain Pain - Right/Left: Left Pain - part of body: Ankle and joints of foot     Time: 3419-6222 PT Time Calculation (min) (ACUTE ONLY): 24 min  Charges:  $Therapeutic Exercise: 23-37 mins                     Garnetta Fedrick  B. Beverely Risen PT, DPT Acute Rehabilitation Services Pager [phone] Office [phone]    [name] 05/24/2021, 11:17 AM

## 2021-05-24 NOTE — Progress Notes (Signed)
FPTS Brief Progress Note  S:Evan Rodriguez was awake and sitting in bed in silence. He said he is having left leg pain and ready to have his procedure done.   O: BP 122/83 (BP Location: Left Arm)   Pulse 88   Temp 98 F (36.7 C) (Oral)   Resp 20   Ht 5\' 6"  (1.676 m)   Wt 71.8 kg   SpO2 98%   BMI 25.55 kg/m   General:Awake, sitting in bed, well appearing Respiration: breathing comfortably on room air   A/P: LLE Ischemia with overlying cellulitis PAD Duplex ultrasound revealed reocclusion of the left peroneal artery bypass graft.  -VVS Following, appreciate recs -BKA schedule for tomorrow per VVS -Continue Zosyn  -Continue oxycodone 5 mg Q6H prn -Tylenol 1000 mg q6h -F/u blood culture  -Atorvastatin 20 mg daily   , MD 05/24/2021, 2:08 AM PGY-1, [facility] Night Resident  Please page [phone] with questions.

## 2021-05-24 NOTE — Progress Notes (Signed)
ANTICOAGULATION CONSULT NOTE - Follow Up Consult  Pharmacy Consult for IV Heparin Indication: Ischemic limb, hx splenic artery thrombosis  No Known Allergies  Patient Measurements: Height: 5\' 6"  (167.6 cm) Weight: 71.8 kg (158 lb 4.6 oz) IBW/kg (Calculated) : 63.8 Heparin Dosing Weight: 70 kg  Vital Signs: Temp: 98.1 F (36.7 C) (07/22 0458) Temp Source: Oral (07/22 0458) BP: 123/89 (07/22 0458) Pulse Rate: 86 (07/22 0458)  Labs: Recent Labs    05/21/21 1830 05/21/21 1830 05/22/21 0358 05/22/21 0822 05/22/21 1600 05/23/21 0235 05/23/21 0945 05/24/21 0215  HGB 16.9  --   --  15.3  --  14.2  --  13.9  HCT 51.5  --   --  47.3  --  43.7  --  42.4  PLT 295  --   --  275  --  226  --  264  APTT  --    < >  --  49* 51* 77* 61* 99*  LABPROT 14.3  --   --   --   --   --   --   --   INR 1.1  --   --   --   --   --   --   --   HEPARINUNFRC  --    < >  --  0.54 0.55 0.74*  --  0.55  CREATININE 0.83  --  0.85  --   --  1.07  --   --    < > = values in this interval not displayed.    Estimated Creatinine Clearance: 77 mL/min (by C-G formula based on SCr of 1.07 mg/dL).   Assessment: 45 yr old man on apixaban PTA for hx splenic artery thrombosis (last dose 7/19 AM). Apixaban held for amputation, pharmacy consulted for heparin. Given recent apixaban use, will monitor anticoagulation using aPTT until aPTT and heparin levels correlate.  Heparin level is therapeutic at 0.55, aPTT 99 is also therapeutic on 1600 units/hr - appear to be correlating now. No bleeding noted, CBC stable. L BKA planned for today.  Goal of Therapy:  Heparin level 0.3-0.7 units/ml aPTT 66-102 seconds Monitor platelets by anticoagulation protocol: Yes   Plan:  Continue heparin infusion at 1600 units/hr Monitor daily HL, CBC/plt Monitor for signs/symptoms of bleeding  VVS to hold heparin prior to procedure F/u restart apixaban    Thank you for involving pharmacy in this patient's care.  8/19, PharmD, BCPS Clinical Pharmacist Clinical phone for 05/24/2021 until 3p is [phone] 05/24/2021 8:28 AM  **Pharmacist phone directory can be found on [email or web address] listed under [facility]**

## 2021-05-25 LAB — BASIC METABOLIC PANEL
Anion gap: 8 (ref 5–15)
BUN: 7 mg/dL (ref 6–20)
CO2: 22 mmol/L (ref 22–32)
Calcium: 9 mg/dL (ref 8.9–10.3)
Chloride: 100 mmol/L (ref 98–111)
Creatinine, Ser: 0.68 mg/dL (ref 0.61–1.24)
GFR, Estimated: >60 mL/min (ref >60)
Glucose, Bld: 137 mg/dL — ABNORMAL HIGH (ref 70–99)
Potassium: 4.1 mmol/L (ref 3.5–5.1)
Sodium: 130 mmol/L — ABNORMAL LOW (ref 135–145)

## 2021-05-25 LAB — CBC
HCT: 40.9 % (ref 39.0–52.0)
Hemoglobin: 13.3 g/dL (ref 13.0–17.0)
MCH: 28.7 pg (ref 26.0–34.0)
MCHC: 32.5 g/dL (ref 30.0–36.0)
MCV: 88.3 fL (ref 80.0–100.0)
Platelets: 279 10*3/uL (ref 150–400)
RBC: 4.63 MIL/uL (ref 4.22–5.81)
RDW: 15.6 % — ABNORMAL HIGH (ref 11.5–15.5)
WBC: 14.1 10*3/uL — ABNORMAL HIGH (ref 4.0–10.5)
nRBC: 0 % (ref 0.0–0.2)

## 2021-05-25 LAB — GLUCOSE, CAPILLARY: Glucose-Capillary: 131 mg/dL — ABNORMAL HIGH (ref 70–99)

## 2021-05-25 MED ORDER — GABAPENTIN 100 MG PO CAPS
100.0000 mg | ORAL_CAPSULE | Freq: Three times a day (TID) | ORAL | Status: DC
  Administered 2021-05-25 – 2021-05-28 (×9): 100 mg via ORAL
  Filled 2021-05-25 (×9): qty 1

## 2021-05-25 MED ORDER — MORPHINE SULFATE (PF) 2 MG/ML IV SOLN
2.0000 mg | Freq: Four times a day (QID) | INTRAVENOUS | Status: DC | PRN
Start: 2021-05-25 — End: 2021-05-27
  Administered 2021-05-25 – 2021-05-27 (×5): 2 mg via INTRAVENOUS
  Filled 2021-05-25 (×5): qty 1

## 2021-05-25 MED ORDER — MORPHINE SULFATE (PF) 2 MG/ML IV SOLN
2.0000 mg | Freq: Once | INTRAVENOUS | Status: AC | PRN
Start: 2021-05-26 — End: 2021-05-26
  Administered 2021-05-26: 2 mg via INTRAVENOUS
  Filled 2021-05-25: qty 1

## 2021-05-25 NOTE — Progress Notes (Signed)
Family Medicine Teaching Service Daily Progress Note Intern Pager: [phone]  Patient name: Evan Rodriguez Medical record number: [id number] Date of birth: 1976-10-12 Age: 45 y.o. Gender: male  Primary Care Provider: Reece Leader, DO Consultants: VVS Code Status: Full  Pt Overview and Major Events to Date:  7/19 admitted 7/22 BKA by VVS   Assessment and Plan:  Evan Rodriguez is a 45 year old male who presented with worsening left foot pain and ischemic nonhealing wound with overlying cellulitis.  PMH is significant for PAD, MI, CAD and splenic arterial thrombosis.  Left foot cellulitis  Left Foot  nonhealing Ischemic Injury Patient is post op day 47 of BKA procedure performed by VVS. He said his pain is not well controlled and oxycodone doesn't provide much relieve. He rates his pain 10/10 and denies any fever, chills, or SOB. Patient report that he's had one BM since surgery. -VVS following, appreciate recs -Oxycodone 5 to 10 mg every 4 hours as needed -Ordered 2mg  Morphine IV q6h prn -Tylenol 1000 mg q6h -continue Zosyn  -Deferred to VVS for antibiotics    CAD Splenic artery thrombosis Patient's home medication include Eliquis.  Eliquis was held due to patient's amputation procedure. -Restart Eliquis -Continue aspirin 81mg  daily   Tobacco Use - continue 21mg  Nicotine patch daily   FEN/GI: Heart healthy diet PPx: Heparin Dispo:Pending PT recommendations  tomorrow. Barriers include postop status.   Subjective:  Evan Rodriguez said he's doing well but in severe pain. He rates the pain 10/10 and expressed that oxycodone doesn't improve his pain  Objective: Temp:  [97.6 F (36.4 C)-98.7 F (37.1 C)] 98.7 F (37.1 C) (07/22 2239) Pulse Rate:  [77-101] 77 (07/22 2239) Resp:  [16-23] 18 (07/22 2239) BP: (113-131)/(75-99) 113/75 (07/22 2239) SpO2:  [91 %-100 %] 97 % (07/22 2239) Weight:  [69.9 kg] 69.9 kg (07/22 2219) Physical Exam: General: Awake, Alert, in distress due to  pain Cardiovascular: Tachycardic, No murmurs Respiratory: Clear Breath sound bilaterally Abdomen: Bowel sounds present, No distention or tenderness Extremities:Surgery site dressing on left foot is dry and clean  Laboratory: Recent Labs  Lab 05/23/21 0235 05/24/21 0215 05/25/21 0048  WBC 9.4 8.8 14.1*  HGB 14.2 13.9 13.3  HCT 43.7 42.4 40.9  PLT 226 264 279   Recent Labs  Lab 05/21/21 1830 05/22/21 0358 05/23/21 0235 05/25/21 0048  NA 136 135 134* 130*  K 4.5 3.4* 3.7 4.1  CL 100 102 105 100  CO2 25 25 22 22   BUN 10 12 16 7   CREATININE 0.83 0.85 1.07 0.68  CALCIUM 9.9 9.4 9.0 9.0  PROT 8.3*  --   --   --   BILITOT 0.7  --   --   --   ALKPHOS 107  --   --   --   ALT 25  --   --   --   AST 19  --   --   --   GLUCOSE 112* 132* 124* 137*      Imaging/Diagnostic Tests: No image studies  05/24/21, MD 05/25/2021, 2:44 AM PGY-1, [facility] FPTS Intern pager: [phone], text pages welcome

## 2021-05-25 NOTE — Progress Notes (Addendum)
Inpatient Rehab Admissions Coordinator:  Consult received. PT is recommending HH.  Awaiting OT recommendations.  Will continue to follow.     Wolfgang Phoenix, MS, CCC-SLP Admissions Coordinator [phone]

## 2021-05-25 NOTE — Evaluation (Signed)
Occupational Therapy Evaluation Patient Details Name: Torrian Canion MRN: [id number] DOB: 05-May-1976 Today's Date: 05/25/2021    History of Present Illness 45 year old  presenting with pain and erythema of the left lower extremity consistent with progression of his cellulitis and worsening vascular disease. 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   Clinical Impression   Pt re-evaluated this session s/p L BKA. Pt presents with some new balance deficits from recent procedure. Overall pt is at min guard level for safety due to balance deficits. Pt motivated to continue ambulating and exercising to get stronger and return to independence. OT will continue to follow acutely.     Follow Up Recommendations  Home health OT    Equipment Recommendations  Tub/shower bench    Recommendations for Other Services       Precautions / Restrictions Precautions Precautions: Fall Precaution Comments: s/p L bka Restrictions Weight Bearing Restrictions: Yes LLE Weight Bearing: Non weight bearing      Mobility Bed Mobility Overal bed mobility: Independent                  Transfers Overall transfer level: Needs assistance Equipment used: Rolling walker (2 wheeled) Transfers: Sit to/from Stand Sit to Stand: Min guard         General transfer comment: Min G for safety    Balance Overall balance assessment: Mild deficits observed, not formally tested                                         ADL either performed or assessed with clinical judgement   ADL Overall ADL's : Needs assistance/impaired                                       General ADL Comments: Pt now at min guard  level due to adapting to new balance requirements and learning compensatory strategies for all ADL's.     Vision Baseline Vision/History: No visual deficits Patient Visual Report: No change from baseline Vision Assessment?: No apparent visual deficits     Perception Perception Perception Tested?: No   Praxis Praxis Praxis tested?: Not tested    Pertinent Vitals/Pain Pain Assessment: 0-10 Pain Score: 5  Pain Location: L residual limb Pain Descriptors / Indicators: Discomfort;Sore Pain Intervention(s): Limited activity within patient's tolerance;Monitored during session;Repositioned;Premedicated before session     Hand Dominance Left   Extremity/Trunk Assessment Upper Extremity Assessment Upper Extremity Assessment: Overall WFL for tasks assessed   Lower Extremity Assessment Lower Extremity Assessment: Defer to PT evaluation   Cervical / Trunk Assessment Cervical / Trunk Assessment: Normal   Communication Communication Communication: No difficulties   Cognition Arousal/Alertness: Awake/alert Behavior During Therapy: WFL for tasks assessed/performed Overall Cognitive Status: Within Functional Limits for tasks assessed                                     General Comments  BKA dressing bloody, pt reported that it was from overnight. MD  aware    Exercises     Shoulder Instructions      Home Living Family/patient expects to be discharged to:: Private residence Living Arrangements: Children Available Help at Discharge: Family;Available PRN/intermittently Type of Home: Mobile home Home Access: Ramped entrance     Home Layout: One level     Bathroom Shower/Tub: Chief Strategy Officer: Standard Bathroom Accessibility: Yes How Accessible: Accessible via walker Home Equipment: Walker - 2 wheels;Wheelchair - manual;Shower seat   Additional Comments: Pt reports his children are 34 and 8 yo.      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 work        OT Problem List: Pain;Impaired sensation;Decreased safety awareness;Decreased knowledge of use of DME or AE;Decreased knowledge of precautions      OT Treatment/Interventions: Self-care/ADL training;Therapeutic exercise;DME and/or AE instruction;Therapeutic activities;Patient/family education;Balance training    OT Goals(Current goals can be found in the care plan section) Acute Rehab OT Goals Patient Stated Goal: To decrease pain OT Goal Formulation: With patient Time For Goal Achievement: 06/06/21 Potential to Achieve Goals: Good  OT Frequency: Min 2X/week   Barriers to D/C:            Co-evaluation              AM-PAC OT "6 Clicks" Daily Activity     Outcome Measure Help from another person eating meals?: None Help from another person taking care of personal grooming?: A Little Help from another person toileting, which includes using toliet, bedpan, or urinal?: A Little Help from another person bathing (including washing, rinsing, drying)?: A Little Help from another person to put on and taking off regular upper body clothing?: A Little Help from another person to put on and taking off regular lower body clothing?: A Little 6 Click Score: 19   End of Session Equipment Utilized During Treatment: Rolling walker Nurse Communication: Mobility status  Activity Tolerance: Patient limited by pain;Patient tolerated treatment well Patient left: in bed;with call bell/phone within reach  OT Visit Diagnosis: Other abnormalities of gait and mobility (R26.89);Muscle weakness (generalized) (M62.81) Pain - Right/Left: Left Pain - part of body: Leg                Time: 1759-1819 OT Time Calculation (min): 20 min Charges:  OT General Charges $OT Visit: 1 Visit OT Evaluation $OT Re-eval: 1 Re-eval  Simeon Vera H., OTR/L Acute Rehabilitation  Elysabeth Aust Elane  Bing Plume 05/25/2021, 6:53 PM

## 2021-05-25 NOTE — Evaluation (Signed)
Physical Therapy Evaluation Patient Details Name: Evan Rodriguez MRN: [id number] DOB: 30-Nov-1975 Today's Date: 05/25/2021   History of Present Illness  45 year old  presenting with pain and erythema of the left lower extremity consistent with progression of his cellulitis and worsening vascular disease. 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   Clinical Impression  Patient received in bed, reports severe pain, 10/10 in left residual limb. Patient is independent with bed mobility, requires cues for hand placement for transfers. He is able to transfer with min guard/supervision. Patient ambulated around room with RW and min guard. Educated to work on hip and knee extension for preparation for prosthetic. Patient will continue to benefit from skilled PT to work on improving activity tolerance and strength.     Follow Up Recommendations Home health PT;Supervision - Intermittent    Equipment Recommendations  None recommended by PT    Recommendations for Other Services       Precautions / Restrictions Precautions Precautions: Fall Restrictions Weight Bearing Restrictions: Yes LLE Weight Bearing: Non weight bearing      Mobility  Bed Mobility Overal bed mobility: Independent                  Transfers Overall transfer level: Needs assistance Equipment used: Rolling walker (2 wheeled) Transfers: Sit to/from Stand Sit to Stand: Supervision         General transfer comment: Cues for hand placement using RW  Ambulation/Gait Ambulation/Gait assistance: Min guard Gait Distance (Feet): 25 Feet Assistive device: Rolling walker (2 wheeled) Gait Pattern/deviations: Step-to pattern;Decreased step length -  right Gait velocity: decreased   General Gait Details: Patient is able to hop using RW around room with min guard/supervision. Very good balance.  Stairs            Wheelchair Mobility    Modified Rankin (Stroke Patients Only)       Balance Overall balance assessment: Mild deficits observed, not formally tested                                           Pertinent Vitals/Pain Pain Assessment: 0-10 Pain Score: 10-Worst pain ever Pain Location: L residual limb Pain Descriptors / Indicators: Discomfort;Sore Pain Intervention(s): Monitored during session;Repositioned    Home Living Family/patient expects to be discharged to:: Private residence Living Arrangements: Children Available Help at Discharge: Family;Available PRN/intermittently Type of Home: Mobile home Home Access: Ramped entrance     Home Layout: One level Home Equipment: Walker - 2 wheels;Wheelchair - manual;Shower seat Additional Comments: Pt reports his children are 49 and 12 yo.    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 Patient has severe pain, but moves well. Good strength in limb LLE: Unable to  fully assess due to pain    Cervical / Trunk Assessment Cervical / Trunk Assessment: Normal  Communication   Communication: No difficulties  Cognition Arousal/Alertness: Awake/alert Behavior During Therapy: WFL for tasks assessed/performed Overall Cognitive Status: Within Functional Limits for tasks assessed                                        General Comments      Exercises Other  Exercises Other Exercises: educated on activating hip/knee extensors to kepp hip and knee straight to prepare for prosthetic   Assessment/Plan    PT Assessment Patient needs continued PT services  PT Problem List Decreased strength;Decreased mobility;Decreased activity tolerance;Pain;Decreased knowledge of precautions;Decreased knowledge of use of DME;Decreased skin integrity       PT Treatment Interventions DME instruction;Therapeutic exercise;Gait training;Functional mobility training;Therapeutic activities;Patient/family education;Wheelchair mobility training    PT Goals (Current goals can be found in the Care Plan section)  Acute Rehab PT Goals Patient Stated Goal: To decrease pain PT Goal Formulation: With patient Time For Goal Achievement: 06/05/21 Potential to Achieve Goals: Good    Frequency Min 3X/week   Barriers to discharge        Co-evaluation               AM-PAC PT "6 Clicks" Mobility  Outcome Measure Help needed turning from your back to your side while in a flat bed without using bedrails?: None Help needed moving from lying on your back to sitting on the side of a flat bed without using bedrails?: None Help needed moving to and from a bed to a chair (including a wheelchair)?: A Little Help needed standing up from a chair using your arms (e.g., wheelchair or bedside chair)?: A Little Help needed to walk in hospital room?: A Little Help needed climbing 3-5 steps with a railing? : A Lot 6 Click Score: 19    End of Session Equipment Utilized During Treatment: Gait belt Activity Tolerance: Patient tolerated treatment well Patient left: in chair;with call bell/phone within reach Nurse Communication: Mobility status PT Visit Diagnosis: Pain;Difficulty in walking, not elsewhere classified (R26.2) Pain - Right/Left: Left Pain - part of body: Leg    Time: 1030-1046 PT Time Calculation (min) (ACUTE ONLY): 16 min   Charges:   PT Evaluation $PT  Re-evaluation: 1 Re-eval          Kamarie Palma, PT, GCS 05/25/21,11:14 AM

## 2021-05-25 NOTE — Progress Notes (Signed)
[facility] OF [place] PROGRESS NOTE  ASSESSMENT / PLAN: Evan Rodriguez is a 45 y.o. male status post left below-knee amputation for nonsalvageable left lower extremity 05/24/2021.  Mild strikethrough on the bandage today.  Will observe this.  We will change dressing tomorrow.  Mobilize with PT/OT.  SUBJECTIVE: No complaints.  Pain is well controlled.  OBJECTIVE: BP 115/74 (BP Location: Left Arm)   Pulse 82   Temp 97.7 F (36.5 C) (Oral)   Resp 18   Ht 5\' 6"  (1.676 m)   Wt 69.9 kg   SpO2 98%   BMI 24.87 kg/m   Intake/Output Summary (Last 24 hours) at 05/25/2021 1133 Last data filed at 05/25/2021 0929 Gross per 24 hour  Intake 1472.75 ml  Output 3130 ml  Net -1657.25 ml    Constitutional: well appearing. no acute distress. Cardiac: RRR. Pulmonary: unlabored Vascular: left BKA dressing with mild strikethrough.  CBC Latest Ref Rng & Units 05/25/2021 05/24/2021 05/23/2021  WBC 4.0 - 10.5 K/uL 14.1(H) 8.8 9.4  Hemoglobin 13.0 - 17.0 g/dL 05/25/2021 40.9 81.1  Hematocrit 39.0 - 52.0 % 40.9 42.4 43.7  Platelets 150 - 400 K/uL 279 264 226     CMP Latest Ref Rng & Units 05/25/2021 05/23/2021 05/22/2021  Glucose 70 - 99 mg/dL 05/24/2021) 782(N) 562(Z)  BUN 6 - 20 mg/dL 7 16 12   Creatinine 0.61 - 1.24 mg/dL 308(M 5.78  Sodium 135 - 145 mmol/L 130(L) 134(L) 135  Potassium 3.5 - 5.1 mmol/L 4.1 3.7 3.4(L)  Chloride 98 - 111 mmol/L 100 105 102  CO2 22 - 32 mmol/L 22 22 25   Calcium 8.9 - 10.3 mg/dL 9.0 9.0 9.4  Total Protein 6.5 - 8.1 g/dL - - -  Total Bilirubin 0.3 - 1.2 mg/dL - - -  Alkaline Phos 38 - 126 U/L - - -  AST 15 - 41 U/L - - -  ALT 0 - 44 U/L - - -    Estimated Creatinine Clearance: 103 mL/min (by C-G formula based on SCr of 0.68 mg/dL).  4.69. 6.29, MD [facility] Phone Number: [phone] 05/25/2021 11:33 AM

## 2021-05-26 DIAGNOSIS — Q8901 Asplenia (congenital): Secondary | ICD-10-CM

## 2021-05-26 DIAGNOSIS — E785 Hyperlipidemia, unspecified: Secondary | ICD-10-CM

## 2021-05-26 DIAGNOSIS — I739 Peripheral vascular disease, unspecified: Secondary | ICD-10-CM

## 2021-05-26 DIAGNOSIS — L03116 Cellulitis of left lower limb: Secondary | ICD-10-CM

## 2021-05-26 DIAGNOSIS — F17200 Nicotine dependence, unspecified, uncomplicated: Secondary | ICD-10-CM

## 2021-05-26 LAB — CBC
HCT: 41.9 % (ref 39.0–52.0)
Hemoglobin: 13.8 g/dL (ref 13.0–17.0)
MCH: 29.2 pg (ref 26.0–34.0)
MCHC: 32.9 g/dL (ref 30.0–36.0)
MCV: 88.6 fL (ref 80.0–100.0)
Platelets: 302 10*3/uL (ref 150–400)
RBC: 4.73 MIL/uL (ref 4.22–5.81)
RDW: 15.9 % — ABNORMAL HIGH (ref 11.5–15.5)
WBC: 10.6 10*3/uL — ABNORMAL HIGH (ref 4.0–10.5)
nRBC: 0 % (ref 0.0–0.2)

## 2021-05-26 LAB — CULTURE, BLOOD (ROUTINE X 2)
Culture: NO GROWTH
Culture: NO GROWTH
Special Requests: ADEQUATE
Special Requests: ADEQUATE

## 2021-05-26 NOTE — Plan of Care (Signed)
  Problem: Clinical Measurements: Goal: Ability to maintain clinical measurements within normal limits will improve Outcome: Progressing   

## 2021-05-26 NOTE — Progress Notes (Signed)
Family Medicine Teaching Service Daily Progress Note Intern Pager: [phone]  Patient name: Evan Rodriguez Medical record number: [id number] Date of birth: September 24, 1976 Age: 45 y.o. Gender: male  Primary Care Provider: Reece Leader, DO Consultants: vascular surgery, wound RN  Code Status: FULL  Pt Overview and Major Events to Date:  05/21/21: Admitted to FPTS 05/24/21: BKA by vascular surgery   Assessment and Plan: Evan Rodriguez is a 45 y.o. male  presented with worsening left foot pain and ischemic nonhealing wound with overlying cellulitis.  PMH is significant for PAD, MI, CAD and splenic arterial thrombosis.   Left Foot Cellulitis  Critical Left Lower Limb Ischemia s/p BKA  PAD  Pt is post-op day 2 of left BKA. Vascular surgery to change dressing today. We added gabapentin for neuropathic/phantom limb pain and pt reports it is helping. WBC down trending. Remains afebrile.  Antibiotics per vascular surgery.  - Gabapentin 100 mg TID  - Zosyn  - Tylenol 1000 mg q6h - Morphine 2 mg q6hr PRN - Oxycodone 5 mg q6h pRN  - Sennokot for bowel ppx - PT / OT: home health   Hyponatremia Na 130 yesterday, has been intermittently low since May 2022. Etiology unclear. Pt is asymptomatic. Will monitor for now.  - Repeat in AM  Tobacco Abuse  - Continue nicotine patch   FEN/GI: sennokot for bowel ppx PPx: Eliquis   Disposition: home  Subjective:  Pt reports leg pain. He just received morphine. States Gabapentin is helping.   Objective: Temp:  [97.7 F (36.5 C)-98.8 F (37.1 C)] 98.8 F (37.1 C) (07/23 2144) Pulse Rate:  [82-106] 98 (07/23 2144) Resp:  [17-19] 19 (07/23 2144) BP: (105-115)/(74) 105/74 (07/23 2144) SpO2:  [92 %-98 %] 92 % (07/23 2144)   Physical Exam: General: sitting upright in bed watching TV Cardiovascular: regular rate and rhythm Respiratory: clear to ascultation bilaterally, no increased work of breathing  Abdomen: soft, non-tender,  non-distended Extremities: s/p left BKA, ACE wrap clean and dry  Laboratory: Recent Labs  Lab 05/24/21 0215 05/25/21 0048 05/26/21 0245  WBC 8.8 14.1* 10.6*  HGB 13.9 13.3 13.8  HCT 42.4 40.9 41.9  PLT 264 279 302   Recent Labs  Lab 05/21/21 1830 05/22/21 0358 05/23/21 0235 05/25/21 0048  NA 136 135 134* 130*  K 4.5 3.4* 3.7 4.1  CL 100 102 105 100  CO2 25 25 22 22   BUN 10 12 16 7   CREATININE 0.83 0.85 1.07 0.68  CALCIUM 9.9 9.4 9.0 9.0  PROT 8.3*  --   --   --   BILITOT 0.7  --   --   --   ALKPHOS 107  --   --   --   ALT 25  --   --   --   AST 19  --   --   --   GLUCOSE 112* 132* 124* 137*      Imaging/Diagnostic Tests: No results found.   , DO 05/26/2021, 5:38 AM PGY-3, [facility]    FPTS Intern pager: [phone], text pages welcome

## 2021-05-26 NOTE — Progress Notes (Addendum)
FPTS Brief Progress Note  S Saw patient at bedside this evening. He reports his pain is fairly well controlled with current analgesia. No acute concerns.    O: BP 93/73 (BP Location: Left Arm)   Pulse 100   Temp 98.8 F (37.1 C) (Oral)   Resp 18   Ht 5\' 6"  (1.676 m)   Wt 69.9 kg   SpO2 96%   BMI 24.87 kg/m    General: Alert, no acute distress Cardio: well perfused  Pulm: normal work of breathing Extremities: Left BKA, ace wrap over LLE, dressings dry and in tact  Neuro: Cranial nerves grossly intact   A/P: Plan per day team  -VVS recs - Orders reviewed. Labs for AM ordered, which was adjusted as needed.   , MD 05/26/2021, 10:51 PM PGY-3, [facility] Night Resident  Please page [phone] with questions.

## 2021-05-26 NOTE — Progress Notes (Signed)
FPTS Interim Night Progress Note  S:Patient sleeping comfortably.  Rounded with primary night RN.  No concerns voiced.  No orders required.    O: Today's Vitals   05/25/21 2038 05/25/21 2144 05/25/21 2237 05/25/21 2318  BP:  105/74    Pulse:  98    Resp:  19    Temp:  98.8 F (37.1 C)    TempSrc:  Oral    SpO2:  92%    Weight:      Height:      PainSc: 6   8  4        A/P: Continue current management  MD PGY-3, [facility] Family Medicine Service pager [phone]

## 2021-05-26 NOTE — Plan of Care (Signed)

## 2021-05-26 NOTE — Progress Notes (Signed)
VASCULAR [facility] VEIN SPECIALISTS OF [place] PROGRESS NOTE  ASSESSMENT / PLAN: Evan Rodriguez is a 45 y.o. male status post left below-knee amputation for nonsalvageable left lower extremity 05/24/2021.  Dressing changed. Stump mildly edematous. Will continue to wrap. Mobilize with PT/OT.  SUBJECTIVE: No complaints. Dressing changed with IV morphine.  OBJECTIVE: BP 107/83 (BP Location: Left Arm)   Pulse 92   Temp 98.4 F (36.9 C) (Oral)   Resp 18   Ht 5\' 6"  (1.676 m)   Wt 69.9 kg   SpO2 97%   BMI 24.87 kg/m   Intake/Output Summary (Last 24 hours) at 05/26/2021 0842 Last data filed at 05/26/2021 0600 Gross per 24 hour  Intake 940 ml  Output 2070 ml  Net -1130 ml     Constitutional: well appearing. no acute distress. Cardiac: RRR. Pulmonary: unlabored Vascular: left BKA stump with mild edema below the knee. Flaps healthy.  CBC Latest Ref Rng & Units 05/26/2021 05/25/2021 05/24/2021  WBC 4.0 - 10.5 K/uL 10.6(H) 14.1(H) 8.8  Hemoglobin 13.0 - 17.0 g/dL 05/26/2021 77.4 12.8  Hematocrit 39.0 - 52.0 % 41.9 40.9 42.4  Platelets 150 - 400 K/uL 302 279 264     CMP Latest Ref Rng & Units 05/25/2021 05/23/2021 05/22/2021  Glucose 70 - 99 mg/dL 05/24/2021) 767(M) 094(B)  BUN 6 - 20 mg/dL 7 16 12   Creatinine 0.61 - 1.24 mg/dL 096(G 8.36  Sodium 135 - 145 mmol/L 130(L) 134(L) 135  Potassium 3.5 - 5.1 mmol/L 4.1 3.7 3.4(L)  Chloride 98 - 111 mmol/L 100 105 102  CO2 22 - 32 mmol/L 22 22 25   Calcium 8.9 - 10.3 mg/dL 9.0 9.0 9.4  Total Protein 6.5 - 8.1 g/dL - - -  Total Bilirubin 0.3 - 1.2 mg/dL - - -  Alkaline Phos 38 - 126 U/L - - -  AST 15 - 41 U/L - - -  ALT 0 - 44 U/L - - -    Estimated Creatinine Clearance: 103 mL/min (by C-G formula based on SCr of 0.68 mg/dL).  6.29. 4.76, MD [facility] of [facility] Phone Number: [phone] 05/26/2021 8:42 AM

## 2021-05-26 NOTE — Progress Notes (Signed)
Inpatient Rehab Admissions Coordinator:  Noted OT also recommending HH. Pt is currently independent with bed mobility, min G-supervision with other aspects of mobility, and min G with ADLs. Do not think pt's deficits warrant intensive therapy that CIR provides.  Will sign off.   Wolfgang Phoenix, MS, CCC-SLP Admissions Coordinator [phone]

## 2021-05-26 NOTE — Plan of Care (Signed)
  Problem: Education: Goal: Knowledge of General Education information will improve Description Including pain rating scale, medication(s)/side effects and non-pharmacologic comfort measures Outcome: Progressing   

## 2021-05-27 ENCOUNTER — Encounter (HOSPITAL_COMMUNITY): Payer: Self-pay | Admitting: Vascular Surgery

## 2021-05-27 LAB — BASIC METABOLIC PANEL
Anion gap: 6 (ref 5–15)
BUN: 9 mg/dL (ref 6–20)
CO2: 24 mmol/L (ref 22–32)
Calcium: 8.9 mg/dL (ref 8.9–10.3)
Chloride: 105 mmol/L (ref 98–111)
Creatinine, Ser: 0.78 mg/dL (ref 0.61–1.24)
GFR, Estimated: >60 mL/min (ref >60)
Glucose, Bld: 120 mg/dL — ABNORMAL HIGH (ref 70–99)
Potassium: 3.9 mmol/L (ref 3.5–5.1)
Sodium: 135 mmol/L (ref 135–145)

## 2021-05-27 LAB — CBC
HCT: 41.1 % (ref 39.0–52.0)
Hemoglobin: 13.2 g/dL (ref 13.0–17.0)
MCH: 28.5 pg (ref 26.0–34.0)
MCHC: 32.1 g/dL (ref 30.0–36.0)
MCV: 88.8 fL (ref 80.0–100.0)
Platelets: 353 10*3/uL (ref 150–400)
RBC: 4.63 MIL/uL (ref 4.22–5.81)
RDW: 16.2 % — ABNORMAL HIGH (ref 11.5–15.5)
WBC: 9.7 10*3/uL (ref 4.0–10.5)
nRBC: 0 % (ref 0.0–0.2)

## 2021-05-27 MED ORDER — MORPHINE SULFATE ER 15 MG PO TBCR
15.0000 mg | EXTENDED_RELEASE_TABLET | Freq: Two times a day (BID) | ORAL | Status: AC
  Administered 2021-05-27 (×2): 15 mg via ORAL
  Filled 2021-05-27 (×2): qty 1

## 2021-05-27 NOTE — Progress Notes (Addendum)
   VASCULAR SURGERY ASSESSMENT & PLAN:   PAD presenting with CLI of LLE  POD 3 left BKA. Afebrile.Healing without signs of infection or hematoma. Procure Ampu-shield for patient today. OK for discharge from vascular surgery perspective when no longer needing IV pain medication and per PT recs. He will need to follow-up in our office in 4 weeks for staple removal. Continue aspirin and statin.  CAD Splenic artery thrombosis Patient's home medication include Eliquis.  This has been restarted.  Dispo: home with HH/PT   SUBJECTIVE:   Still with post-op pain requiring IV pain med  PHYSICAL EXAM:   Vitals:   05/26/21 0937 05/26/21 1749 05/26/21 2008 05/27/21 0520  BP: 102/75 101/72 93/73 102/76  Pulse: 97 97 100 95  Resp: 18 17 18 18   Temp: 98.9 F (37.2 C) 98.2 F (36.8 C) 98.8 F (37.1 C) 98 F (36.7 C)  TempSrc: Oral Oral Oral Oral  SpO2: 96% 97% 96% 97%  Weight:      Height:       General appearance: Awake, alert in no apparent distress Cardiac: Heart rate and rhythm are regular Respirations: Nonlabored Extremities: LLE: Amputation site incision is well approximated without bleeding or hematoma.  Anterior and posterior flaps are warm and well-perfused      LABS:   Lab Results  Component Value Date   WBC 9.7 05/27/2021   HGB 13.2 05/27/2021   HCT 41.1 05/27/2021   MCV 88.8 05/27/2021   PLT 353 05/27/2021   Lab Results  Component Value Date   CREATININE 0.78 05/27/2021   Lab Results  Component Value Date   INR 1.1 05/21/2021   CBG (last 3)  Recent Labs    05/24/21 2205 05/25/21 0646  GLUCAP 131* 131*    PROBLEM LIST:    Active Problems:   History of non-ST elevation myocardial infarction (NSTEMI)   Dyslipidemia (high LDL; low HDL)   Vaping nicotine dependence, non-tobacco product   PAD (peripheral artery disease) (HCC)   Functional asplenia   Lower limb ischemia   Cellulitis of left lower extremity   CURRENT MEDS:    acetaminophen  1,000  mg Oral Q6H   apixaban  5 mg Oral BID   aspirin EC  81 mg Oral Daily   gabapentin  100 mg Oral TID   nicotine  21 mg Transdermal Daily   pneumococcal 13-valent conjugate vaccine  0.5 mL Intramuscular Once   rosuvastatin  20 mg Oral Daily   senna-docusate  1 tablet Oral BID    05/27/21, PA-C  Office: [phone] 05/27/2021   I have interviewed the patient and examined the patient. I agree with the findings by the PA.  05/29/2021, MD

## 2021-05-27 NOTE — Anesthesia Postprocedure Evaluation (Signed)
Anesthesia Post Note  Patient: Evan Rodriguez  Procedure(s) Performed: LEFT LEG BELOW KNEE AMPUTATION (Left: Knee)     Patient location during evaluation: PACU Anesthesia Type: General Level of consciousness: awake and alert Pain management: pain level controlled Vital Signs Assessment: post-procedure vital signs reviewed and stable Respiratory status: spontaneous breathing, nonlabored ventilation, respiratory function stable and patient connected to nasal cannula oxygen Cardiovascular status: blood pressure returned to baseline and stable Postop Assessment: no apparent nausea or vomiting Anesthetic complications: no   No notable events documented.  Last Vitals:  Vitals:   05/27/21 0520 05/27/21 0922  BP: 102/76 116/75  Pulse: 95 98  Resp: 18 16  Temp: 36.7 C 37.1 C  SpO2: 97% 99%    Last Pain:  Vitals:   05/27/21 0922  TempSrc: Oral  PainSc:                  Lavel Rieman S

## 2021-05-27 NOTE — TOC Progression Note (Signed)
Transition of Care [facility]) - Progression Note    Patient Details  Name: Evan Rodriguez MRN: [id number] Date of Birth: 1975/11/22  Transition of Care [facility]) CM/SW Contact  Levada Schilling Phone Number: 05/27/2021, 2:44 PM  Clinical Narrative:     CSW received a consult from NCM to speak with pt's daughter.  CSW spoke with pt's daughter concerning applying for assistance with medicaid and disability.  CSW send an email to financial counselor  with request.  Expected Discharge Plan: Home w Home Health Services Barriers to Discharge: Continued Medical Work up  Expected Discharge Plan and Services Expected Discharge Plan: Home w Home Health Services In-house Referral: NA Discharge Planning Services: CM Consult Post Acute Care Choice: Durable Medical Equipment, Home Health Living arrangements for the past 2 months: Single Family Home                 DME Arranged: Tub bench DME Agency: [employer] Date DME Agency Contacted: 05/27/21 Time DME Agency Contacted: 1114 Representative spoke with at DME Agency: Velna Hatchet HH Arranged: PT, OT, Nurse's Aide HH Agency: [facility] Date HH Agency Contacted: 05/27/21 Time HH Agency Contacted: 1024 Representative spoke with at [facility] Agency: Pearson Grippe   Social Determinants of Health (SDOH) Interventions    Readmission Risk Interventions No flowsheet data found.

## 2021-05-27 NOTE — Progress Notes (Signed)
Family Medicine Teaching Service Daily Progress Note Intern Pager: [phone]  Patient name: Evan Rodriguez Medical record number: [id number] Date of birth: 1976/03/25 Age: 45 y.o. Gender: male  Primary Care Provider: Reece Leader, DO Consultants: VVS Code Status: Full  Pt Overview and Major Events to Date:  7/19- admitted 7/22- BKA with VVS   Assessment and Plan: Evan Rodriguez is a 45yo male with a history of PAD, MI, CAD, splenic artery thrombosis who presented with critical limb ischemia of the LLE, now s/p BKA. Post-op day 3.   Critical Limb Ischemia of LLE s/p BKA PAD Post-op day 3. Dressing changed yesterday by VVS. WBC now wnl (14.1>10.6>9.7), remains afebrile. Zosyn d/c'd yesterday per VVS recs. Pain is well-controlled. Has only required PRN morphine twice in past 24 hours. Evaluated by CIR yesterday who felt he was not a candidate for intensive rehab. Should be able to discharge once pain control stable on orals.  - Gabapentin 100mg  TID for phantom limb pain - Tylenol 1000mg  q6 - Oxycodone 5mg  q6 PRN - D/c IV morphine; will give two doses MS Contin to cover through tomorrow am - PT/OT  FEN/GI: Heart healthy diet, senna bowel regimen PPx: On Eliquis Dispo:Home with home health  in 2-3 days. Barriers include pain control, IV abx.   Subjective:  Evan Rodriguez reports that he feels overall well this morning.  He says that his pain is well controlled on the IV morphine and oxycodone.  He says that he will have his daughter to help him at home once he has been discharged.  Objective: Temp:  [98 F (36.7 C)-98.9 F (37.2 C)] 98 F (36.7 C) (07/25 0520) Pulse Rate:  [95-100] 95 (07/25 0520) Resp:  [17-18] 18 (07/25 0520) BP: (93-102)/(72-76) 102/76 (07/25 0520) SpO2:  [96 %-97 %] 97 % (07/25 0520) Physical Exam: General: Well-appearing, lying in bed eating breakfast Cardiovascular: Regular rate and rhythm, no murmurs, rubs, or gallops Respiratory: Normal work of breathing on  room air, lungs clear to auscultation bilaterally Abdomen: Abdomen nontender, nondistended Extremities: Left lower extremity with surgical bandage in place having just been redressed by vascular surgery, no edema, streaking, erythema extending proximally.  Laboratory: Recent Labs  Lab 05/25/21 0048 05/26/21 0245 05/27/21 0447  WBC 14.1* 10.6* 9.7  HGB 13.3 13.8 13.2  HCT 40.9 41.9 41.1  PLT 279 302 353   Recent Labs  Lab 05/21/21 1830 05/22/21 0358 05/23/21 0235 05/25/21 0048 05/27/21 0447  NA 136   < > 134* 130* 135  K 4.5   < > 3.7 4.1 3.9  CL 100   < > 105 100 105  CO2 25   < > 22 22 24   BUN 10   < > 16 7 9   CREATININE 0.83   < > 1.07 0.68 0.78  CALCIUM 9.9   < > 9.0 9.0 8.9  PROT 8.3*  --   --   --   --   BILITOT 0.7  --   --   --   --   ALKPHOS 107  --   --   --   --   ALT 25  --   --   --   --   AST 19  --   --   --   --   GLUCOSE 112*   < > 124* 137* 120*   < > = values in this interval not displayed.     Imaging/Diagnostic Tests: No new imaging or tests  05/25/21, MD  05/27/2021, 8:59 AM PGY-1, [facility] FPTS Intern pager: [phone], text pages welcome

## 2021-05-27 NOTE — TOC Progression Note (Signed)
Transition of Care [facility]) - Progression Note    Patient Details  Name: Evan Rodriguez MRN: [id number] Date of Birth: 06/07/1976  [facility] of [facility]) CM/SW Contact  Huston Foley Jacklynn Ganong, RN Phone Number: 05/27/2021, 11:16 AM  Clinical Narrative:  Patient is 45 yr old male s/p left BKA. Case manager spoke with patient concerning discharge needs. Patient is uninsured, [employer] referral called to [employer], [employer]. Patient has wheelchair at home and will have support of his daughter, whom he lives with. Tub bench will be delivered to patients room by [employer].      Expected Discharge Plan: Home w Home Health Services Barriers to Discharge: Continued Medical Work up  Expected Discharge Plan and Services Expected Discharge Plan: Home w Home Health Services In-house Referral: NA Discharge Planning Services: CM Consult Post Acute Care Choice: Durable Medical Equipment, Home Health Living arrangements for the past 2 months: Single Family Home                 DME Arranged: Tub bench DME Agency: [facility] Date DME Agency Contacted: 05/27/21 Time DME Agency Contacted: 1114 Representative spoke with at DME Agency: Velna Hatchet HH Arranged: PT, OT, Nurse's Aide HH Agency: [facility] Date HH Agency Contacted: 05/27/21 Time HH Agency Contacted: 1024 Representative spoke with at [facility] Agency: Pearson Grippe   Social Determinants of Health (SDOH) Interventions    Readmission Risk Interventions No flowsheet data found.

## 2021-05-27 NOTE — Progress Notes (Signed)
Physical Therapy Treatment Patient Details Name: Evan Rodriguez MRN: [id number] DOB: August 14, 1976 Today's Date: 05/27/2021    History of Present Illness 45 year old  presenting with pain and erythema of the left lower extremity consistent with progression of his cellulitis and worsening vascular disease. 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    PT Comments    Pt had just received pain medication and agreeable to working with therapy. Pt able to perform bed level exercises and then participate in mobility. Pt is able to don residual limb protector independently. Pt bed mobility independent, transfers to RW supervision and ambulation of 40 feet in room with min guard. Educated pt on need for hip and knee extension for optimal prosthetic placement and use, and discussed proper car transfers given higher truck for discharge. D/c plan remains appropriate at this time. PT will continue to follow acutely.    Follow Up Recommendations  Home health PT;Supervision - Intermittent     Equipment Recommendations  None recommended by PT       Precautions / Restrictions Precautions Precautions: Fall Precaution Comments: s/p L bka Restrictions Weight Bearing Restrictions: Yes LLE Weight Bearing: Non weight bearing    Mobility  Bed Mobility Overal bed mobility: Independent                  Transfers Overall transfer level: Needs assistance Equipment used: Rolling walker (2 wheeled) Transfers: Sit to/from Stand Sit to Stand: Supervision         General transfer comment: Cues for hand placement using RW  Ambulation/Gait Ambulation/Gait assistance: Min guard Gait Distance (Feet): 40 Feet Assistive device:  Rolling walker (2 wheeled) Gait Pattern/deviations: Step-to pattern;Decreased step length - right Gait velocity: decreased Gait velocity interpretation: <1.31 ft/sec, indicative of household ambulator General Gait Details: Pt with good hop to pattern, good balance and RW management around obstacles       Balance Overall balance assessment: Mild deficits observed, not formally tested                                          Cognition Arousal/Alertness: Awake/alert Behavior During Therapy: WFL for tasks assessed/performed Overall Cognitive Status: Within Functional Limits for tasks assessed                                        Exercises Amputee Exercises Quad Sets: AROM;Left;Supine Hip ABduction/ADduction: AROM;Left;10 reps;Supine Hip Flexion/Marching: AROM;Left;Supine Knee Flexion: AROM;Left;10 reps;Supine Knee Extension: AROM;Left;10 reps;Supine Other Exercises Other Exercises: educated on activating hip/knee extensors to keep hip and knee straight to prepare for prosthetic    General Comments General comments (skin integrity, edema, etc.): Pt able to don residual limb protector independently      Pertinent Vitals/Pain Pain Assessment: Faces Faces Pain Scale: Hurts a little bit Pain Location: L residual limb Pain Descriptors / Indicators: Discomfort;Sore Pain Intervention(s): Monitored during session;Premedicated before session     PT Goals (current goals can now be found in the care plan section) Acute Rehab PT Goals Patient Stated Goal: To decrease pain  PT Goal Formulation: With patient Time For Goal Achievement: 06/05/21 Potential to Achieve Goals: Good Progress towards PT goals: Progressing toward goals    Frequency    Min 3X/week      PT Plan Current plan remains appropriate       AM-PAC PT "6 Clicks" Mobility   Outcome Measure  Help needed turning from your back to your side while in a flat bed without using  bedrails?: None Help needed moving from lying on your back to sitting on the side of a flat bed without using bedrails?: None Help needed moving to and from a bed to a chair (including a wheelchair)?: A Little Help needed standing up from a chair using your arms (e.g., wheelchair or bedside chair)?: A Little Help needed to walk in hospital room?: A Little Help needed climbing 3-5 steps with a railing? : A Lot 6 Click Score: 19    End of Session Equipment Utilized During Treatment: Gait belt Activity Tolerance: Patient tolerated treatment well Patient left: with call bell/phone within reach;in bed;with bed alarm set Nurse Communication: Mobility status PT Visit Diagnosis: Pain;Difficulty in walking, not elsewhere classified (R26.2) Pain - Right/Left: Left Pain - part of body: Leg     Time: 4627-0350 PT Time Calculation (min) (ACUTE ONLY): 35 min  Charges:  $Gait Training: 8-22 mins $Therapeutic Exercise: 8-22 mins                     Evan Rodriguez B. Beverely Risen PT, DPT Acute Rehabilitation Services Pager [phone] Office [phone]    Evan Rodriguez Fleet 05/27/2021, 4:31 PM

## 2021-05-27 NOTE — Progress Notes (Addendum)
FPTS Brief Progress Note  S Saw patient at bedside this evening.  Patient was awake watching television.  States his pain is well controlled.    O: BP 106/66 (BP Location: Left Arm)   Pulse 93   Temp 98.4 F (36.9 C) (Oral)   Resp 16   Ht 5\' 6"  (1.676 m)   Wt 69.9 kg   SpO2 (!) 83%   BMI 24.87 kg/m    General: Alert, no acute distress Pulm: normal work of breathing Neuro: Cranial nerves grossly intact   A/P: Plan per day team  - Orders reviewed. Labs for AM not ordered, which was adjusted as needed.   , DO 05/28/2021, 12:47 AM PGY-3, [facility] Night Resident  Please page [phone] with questions.

## 2021-05-27 NOTE — Progress Notes (Signed)
Orthopedic Tech Progress Note Patient Details:  Arham Symmonds 1975-11-18 [id number]  Called in order to [employer] for an AMPUSHIELD BK with [facility] Patient ID: Rathana Viveros, male   DOB: January 15, 1976, 45 y.o.   MRN: [id number]  Donald Pore 05/27/2021, 8:57 AM

## 2021-05-28 ENCOUNTER — Other Ambulatory Visit (HOSPITAL_COMMUNITY): Payer: Self-pay

## 2021-05-28 LAB — SURGICAL PATHOLOGY

## 2021-05-28 MED ORDER — SENNOSIDES-DOCUSATE SODIUM 8.6-50 MG PO TABS: 1.0000 | ORAL_TABLET | Freq: Two times a day (BID) | ORAL | Status: DC

## 2021-05-28 MED ORDER — ROSUVASTATIN CALCIUM 20 MG PO TABS
20.0000 mg | ORAL_TABLET | Freq: Every day | ORAL | 1 refills | Status: DC
  Filled 2021-05-28: qty 30, 30d supply, fill #0
  Filled 2021-06-18: qty 30, 30d supply, fill #1

## 2021-05-28 MED ORDER — NICOTINE 21 MG/24HR TD PT24
21.0000 mg | MEDICATED_PATCH | Freq: Every day | TRANSDERMAL | 0 refills | Status: DC
Start: 2021-05-29 — End: 2022-03-05
  Filled 2021-05-28: qty 28, 28d supply, fill #0

## 2021-05-28 MED ORDER — ASPIRIN 81 MG PO TBEC
81.0000 mg | DELAYED_RELEASE_TABLET | Freq: Every day | ORAL | 1 refills | Status: DC
  Filled 2021-05-28: qty 90, 90d supply, fill #0

## 2021-05-28 MED ORDER — APIXABAN 5 MG PO TABS
5.0000 mg | ORAL_TABLET | Freq: Two times a day (BID) | ORAL | 2 refills | Status: DC
  Filled 2021-05-28: qty 60, 30d supply, fill #0

## 2021-05-28 MED ORDER — ACETAMINOPHEN 500 MG PO TABS
1000.0000 mg | ORAL_TABLET | Freq: Four times a day (QID) | ORAL | 0 refills | Status: DC
  Filled 2021-05-28: qty 30, 4d supply, fill #0

## 2021-05-28 MED ORDER — OXYCODONE HCL 5 MG PO TABS
5.0000 mg | ORAL_TABLET | ORAL | 0 refills | Status: DC | PRN
  Filled 2021-05-28: qty 30, 3d supply, fill #0

## 2021-05-28 MED ORDER — GABAPENTIN 100 MG PO CAPS
100.0000 mg | ORAL_CAPSULE | Freq: Three times a day (TID) | ORAL | 0 refills | Status: DC
  Filled 2021-05-28: qty 90, 30d supply, fill #0

## 2021-05-28 NOTE — Progress Notes (Signed)
Submitted application for ELIQUIS to [employer] for patient assistance.   Phone: [phone]  

## 2021-05-28 NOTE — TOC Transition Note (Signed)
Transition of Care (TOC) - CM/SW Discharge Note Donn Pierini RN, BSN Transitions of Care Unit 4E- RN Case Manager See Treatment Team for direct phone #  Cross Coverage for 104M  Patient Details  Name: Evan Rodriguez MRN: [id number] Date of Birth: 1976/01/21  Transition of Care [facility] CM/SW Contact:  Darrold Span, RN Phone Number: 05/28/2021, 2:31 PM   Clinical Narrative:    Pt stable for transition home today, HH has been set up with [facility] for charity program- orders for HHRN/PT/OT/aide/SW- they will reach out to pt to schedule home visits. Have spoken with Pearson Grippe and confirmed pt has been accepted.  TOC pharmacy will assist with medications pt entered into [facility] program for medication assistance.  DME to be supplied by [facility] and delivered to room.     Final next level of care: Home w Home Health Services Barriers to Discharge: Barriers Resolved   Patient Goals and CMS Choice     Choice offered to / list presented to : NA (referral to charity agency)  Discharge Placement                 Home w/ [facility]      Discharge Plan and Services In-house Referral: NA Discharge Planning Services: Medication Assistance, [facility] Program Post Acute Care Choice: Durable Medical Equipment, Home Health          DME Arranged: Tub bench DME Agency: [facility] Date DME Agency Contacted: 05/27/21 Time DME Agency Contacted: 1114 Representative spoke with at DME Agency: Velna Hatchet HH Arranged: RN, PT, OT, Nurse's Aide, Social Work [employer] Agency: [facility] Date HH Agency Contacted: 05/27/21 Time HH Agency Contacted: [phone] Representative spoke with at [facility] Agency: Pearson Grippe  Social Determinants of Health (SDOH) Interventions     Readmission Risk Interventions Readmission Risk Prevention Plan 05/28/2021  Post Dischage Appt Complete  Medication Screening Complete  Transportation Screening Complete  Some recent data might be hidden

## 2021-05-28 NOTE — Discharge Instructions (Addendum)
Dear Glendora Score,  Thank you for letting us participate in your care. You were hospitalized for leg pain and diagnosed with critical limb ischemia of your left leg. You were treated with a below the knee amputation.    POST-HOSPITAL & CARE INSTRUCTIONS You may have some post-operative pain related to the operation, we will send you home with medicine for this. You may also continue to experience "phantom limb" pain in the leg that was removed, your medicine gabapentin is intended to help with this. Please continue taking your aspirin, Plavix, and Eliquis to help prevent future events like this. Go to your follow up appointments (listed below)   DOCTOR'S APPOINTMENT   Future Appointments  Date Time Provider Department Center  06/05/2021  2:45 PM Jerre Simon, MD [facility]  06/26/2021 11:45 AM [facility]    Follow-up Information     [place], [facility] [place] Follow up.   Why: A representative from [facility] will contact you to arrange start date and time for your therapy Contact information: [address] [place] [address] [phone]         Chuck Hint, MD Follow up.   Specialties: Vascular Surgery, Cardiology Why: office will call you to arrange your appt (sent) Contact information: [address] [place] [address] [phone]         Jerre Simon, MD. Go on 06/05/2021.   Specialty: Student Why: At 2:45pm.Please arrive by 2:30pm. This is your hospital follow up appointment with your primary care clinic. Contact information: [address] [place] [address] [phone]                 Take care and be well!  Family Medicine Teaching Service Inpatient Team [place]  [facility]  [address] [place], [place] [address] [phone]

## 2021-05-28 NOTE — Progress Notes (Signed)
AVS reviewed with patient and all questions answered. IV removed per order.

## 2021-05-28 NOTE — Discharge Summary (Addendum)
Family Medicine Teaching [facility] Discharge Summary  Patient name: Evan Rodriguez Medical record number: [id number] Date of birth: [birth year] Age: 45 y.o. Gender: male Date of Admission: 05/21/2021  Date of Discharge: 05/28/2021 Admitting Physician: Westley Chandler, MD  Primary Care Provider: Reece Leader, DO Consultants: Vascular surgery  Indication for Hospitalization: Critical limb ischemia of the left lower extremity  Discharge Diagnoses/Problem List:  Active Problems:   History of non-ST elevation myocardial infarction (NSTEMI)   Dyslipidemia (high LDL; low HDL)   Vaping nicotine dependence, non-tobacco product   PAD (peripheral artery disease) (HCC)   Functional asplenia   Lower limb ischemia   Cellulitis of left lower extremity   Disposition: Home with family support  Discharge Condition: Stable, post-surgical  Discharge Exam:  Gen: Comfortable appearing male, appears state age, resting in bed Cardio: RRR, no m/r/g Pulm: Lungs CTAB, breathing comfortably on RA Abd: Non-tender, non-distended, bowel sounds nl Ext: LLE with orthopedic appliance in place, no swelling, streaking, or erythema extending proximally  Brief Hospital Course:  Evan Rodriguez is a 45 year old male with history of PAD, MI, CAD, and splenic artery thrombosis who presented with left foot pain and was subsequently found to have critical limb ischemia and overlying cellulitis.  Left Lower Limb Critical Ischemia and Cellulitis in the Setting of Peripheral Artery Disease Patient was evaluated in the ED on 7/19 for worsening left foot pain and nonhealing ulcers.  He was found to have no distal pulses, which was confirmed on bedside Doppler evaluation.  He was evaluated by our vascular surgeon colleagues who had placed a peroneal artery bypass graft in that leg in 04/22 and they suspected that the graft had become occluded.  This was confirmed on duplex ultrasonography of the graft and ABIs confirmed critical  limb ischemia.  Vascular surgery felt that due to Evan Rodriguez's chronic vasculopathy that he would not be a candidate for further vascular procedures and would require a below the knee amputation.  We treated his cellulitis with IV Zosyn and his blood cultures remained negative throughout his stay. 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  His postoperative course was uncomplicated and his pain was well controlled on oral narcotics.  He was able to ambulate with physical therapy using his orthopedic appliance prior to discharge.  He was discharged on 7/26 in stable condition.   Items for PCP Follow-up: 1) Evan Rodriguez reports that he received the first dose of his pneumococcal vaccine series in clinic.  He will need a 23-valent pneumococcal vaccine in eight weeks to complete his vaccination series  2) Evan Rodriguez reported that he had no nicotine cravings with nicotine patch in the hospital.  Consider ongoing counseling and therapeutic alliance toward the end goal of smoking cessation. 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  We started him on gabapentin 100 mg 3 times daily for this.  Consider continuing and possibly titrating this to treat his pain.   Significant Procedures:  7/22-  Left BKA with [facility]  Significant Labs and Imaging:  Recent Labs  Lab 05/25/21 0048 05/26/21 0245 05/27/21 0447  WBC 14.1* 10.6* 9.7  HGB 13.3 13.8 13.2  HCT 40.9 41.9 41.1  PLT 279 302 353   Recent Labs  Lab 05/21/21 1830 05/22/21 0358 05/23/21 0235 05/25/21 0048 05/27/21 0447  NA 136 135 134* 130* 135  K 4.5 3.4* 3.7 4.1 3.9  CL 100 102 105 100  105  CO2 25 25 22 22 24   GLUCOSE 112* 132* 124* 137* 120*  BUN 10 12 16 7 9   CREATININE 0.83 0.85 1.07 0.68 0.78  CALCIUM 9.9 9.4 9.0 9.0 8.9  ALKPHOS 107  --   --   --   --   AST 19  --   --   --   --   ALT 25  --   --   --   --   ALBUMIN 4.0  --   --   --   --      Results/Tests  Pending at Time of Discharge: None   Discharge Medications:  Allergies as of 05/28/2021   No Known Allergies      Medication List     STOP taking these medications    doxycycline 100 MG tablet Commonly known as: VIBRA-TABS   nicotine 14 mg/24hr patch Commonly known as: NICODERM CQ - dosed in mg/24 hours Replaced by: nicotine 21 mg/24hr patch       TAKE these medications    acetaminophen 500 MG tablet Commonly known as: TYLENOL Take 2 tablets (1,000 mg total) by mouth every 6 (six) hours.   apixaban 5 MG Tabs tablet Commonly known as: ELIQUIS Take 1 tablet (5 mg total) by mouth 2 (two) times daily.   aspirin 81 MG EC tablet Take 1 tablet (81 mg total) by mouth daily. What changed: additional instructions   gabapentin 100 MG capsule Commonly known as: NEURONTIN Take 1 capsule (100 mg total) by mouth 3 (three) times daily.   nicotine 21 mg/24hr patch Commonly known as: NICODERM CQ - dosed in mg/24 hours Place 1 patch (21 mg total) onto the skin daily. Start taking on: May 29, 2021 Replaces: nicotine 14 mg/24hr patch   oxyCODONE 5 MG immediate release tablet Commonly known as: Oxy IR/ROXICODONE Take 1-2 tablets (5-10 mg total) by mouth every 4 (four) hours as needed for severe pain.   rosuvastatin 20 MG tablet Commonly known as: Crestor Take 1 tablet (20 mg total) by mouth daily.   senna-docusate 8.6-50 MG tablet Commonly known as: Senokot-S Take 1 tablet by mouth 2 (two) times daily.        Discharge Instructions: Please refer to Patient Instructions section of EMR for full details.  Patient was counseled important signs and symptoms that should prompt return to medical care, changes in medications, dietary instructions, activity restrictions, and follow up appointments.   Follow-Up Appointments:  Follow-up Information     [place], [facility] Follow up.   Why: A representative from [facility] will contact you to arrange start date  and time for your therapy Contact information: [address] [place] [facility] [place] [phone]         [id number], MD Follow up.   Specialties: Vascular Surgery, Cardiology Why: office will call you to arrange your appt (sent) Contact information: [address] [place] [phone]         [id number], MD. Go on 06/05/2021.   Specialty: Student Why: At 2:45pm.Please arrive by 2:30pm. This is your hospital follow up appointment with your primary care clinic. Contact information: [address] [place] [address] [place] [phone]                 [id number], MD 05/28/2021, 1:15 PM PGY-1, [place] [facility]  Upper Level Addendum:  I agree with Dr. Alicia Amel and reviewed the above note, making necessary revisions as appropriate.  Jackelyn Poling, DO PGY-3 [facility] Residency

## 2021-05-28 NOTE — Progress Notes (Signed)
Physical Therapy Treatment Patient Details Name: Evan Rodriguez MRN: [id number] DOB: [birth year] Today's Date: 05/28/2021    History of Present Illness 45 year old  presenting with pain and erythema of the left lower extremity consistent with progression of his cellulitis and worsening vascular disease. 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    PT Comments    Pt asleep on entry after receiving pain medication. Easily wakes and is agreeable to therapy. Pt is making good progress towards his goals. Pt reports he is having more phantom pains today. Reminded pt to tactile and visual stimulation to aid in phantom pain reduction. Pt is currently independent in bed mobility, supervision for transfers and min guard for ambulation with RW. Performed standing exercise before returning to bed. D/c plans remain appropriate. Pt scheduled for d/c this afternoon.     Follow Up Recommendations  Home health PT;Supervision - Intermittent     Equipment Recommendations  None recommended by PT       Precautions / Restrictions Precautions Precautions: Fall Precaution Comments: s/p L bka Restrictions Weight Bearing Restrictions: Yes LLE Weight Bearing: Non weight bearing    Mobility  Bed Mobility Overal bed mobility: Independent                  Transfers Overall transfer level: Needs assistance Equipment used: Rolling walker (2 wheeled) Transfers: Sit to/from Stand Sit to Stand: Supervision         General transfer comment: Cues for hand placement using RW  Ambulation/Gait Ambulation/Gait assistance: Min guard Gait Distance (Feet): 80 Feet Assistive device: Rolling walker (2 wheeled) Gait Pattern/deviations:  Step-to pattern;Decreased step length - right Gait velocity: decreased Gait velocity interpretation: <1.31 ft/sec, indicative of household ambulator General Gait Details: min guard for safety, good strong hopping pattern for ambulation in hallway            Balance Overall balance assessment: Mild deficits observed, not formally tested                                          Cognition Arousal/Alertness: Awake/alert Behavior During Therapy: WFL for tasks assessed/performed Overall Cognitive Status: Within Functional Limits for tasks assessed                                        Exercises Amputee Exercises Hip Extension: AROM;Left;5 reps;Standing    General Comments General comments (skin integrity, edema, etc.): good limb protector donning and doffing, looking forward to /c home      Pertinent Vitals/Pain Pain Assessment: Faces Pain Score: 3  Faces Pain Scale: Hurts a little bit Pain Location: L residual limb Pain Descriptors / Indicators: Discomfort;Sore Pain Intervention(s): Limited activity within patient's tolerance;Monitored during session;Repositioned;Premedicated before session     PT Goals (current goals can now be found in the care plan section) Acute Rehab PT Goals Patient Stated Goal: To decrease pain PT Goal Formulation: With patient Time For Goal Achievement: 06/05/21 Potential to Achieve Goals: Good Progress towards PT goals: Progressing toward goals    Frequency  Min 3X/week      PT Plan Current plan remains appropriate       AM-PAC PT "6 Clicks" Mobility   Outcome Measure  Help needed turning from your back to your side while in a flat bed without using bedrails?: None Help needed moving from lying on your back to sitting on the side of a flat bed without using bedrails?: None Help needed moving to and from a bed to a chair (including a wheelchair)?: A Little Help needed standing up from a chair  using your arms (e.g., wheelchair or bedside chair)?: A Little Help needed to walk in hospital room?: A Little Help needed climbing 3-5 steps with a railing? : A Lot 6 Click Score: 19    End of Session Equipment Utilized During Treatment: Gait belt Activity Tolerance: Patient tolerated treatment well Patient left: with call bell/phone within reach;in bed;with bed alarm set Nurse Communication: Mobility status PT Visit Diagnosis: Pain;Difficulty in walking, not elsewhere classified (R26.2) Pain - Right/Left: Left Pain - part of body: Leg     Time: 5038-8828 PT Time Calculation (min) (ACUTE ONLY): 16 min  Charges:  $Gait Training: 8-22 mins                     Evan Rodriguez B. Beverely Risen PT, DPT Acute Rehabilitation Services Pager [phone] Office [phone]    Evan Rodriguez [facility] 05/28/2021, 12:46 PM

## 2021-05-28 NOTE — Progress Notes (Signed)
Occupational Therapy Treatment Patient Details Name: Evan Rodriguez MRN: [id number] DOB: [birth year] Today's Date: 05/28/2021    History of present illness 45 year old  presenting with pain and erythema of the left lower extremity consistent with progression of his cellulitis and worsening vascular disease. 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   OT comments  Pt making good progress with functional goals. Pt eager to possibly d/c home late today. Pt ist at set - sup level with ADLs/selfcare with compensatory LB ADL techniques, sup with mobility using RW. Pt will have hospital bed and tub bench delivered to his home today per pt report  Follow Up Recommendations  Home health OT    Equipment Recommendations  Tub/shower bench;Hospital bed    Recommendations for Other Services      Precautions / Restrictions Precautions Precautions: Fall Precaution Comments: L BKA Restrictions Weight Bearing Restrictions: Yes LLE Weight Bearing: Non weight bearing       Mobility Bed Mobility Overal bed mobility: Independent                  Transfers Overall transfer level: Needs assistance Equipment used: Rolling walker (2 wheeled) Transfers: Sit to/from Stand Sit to Stand: Supervision         General transfer comment: Cues for hand placement using RW    Balance Overall balance assessment: Mild deficits observed, not formally tested                                         ADL either performed or assessed with clinical judgement   ADL Overall ADL's : Needs assistance/impaired     Grooming: Wash/dry hands;Wash/dry face;Supervision/safety   Upper Body Bathing: Set up;Supervision/  safety   Lower Body Bathing: Set up;Supervison/ safety;Sitting/lateral leans   Upper Body Dressing : Set up;Supervision/safety   Lower Body Dressing: Set up;Supervision/safety;Sitting/lateral leans   Toilet Transfer: Supervision/safety;Ambulation;RW;Stand-pivot   Toileting- Architect and Hygiene: Supervision/safety       Functional mobility during ADLs: Supervision/safety       Vision Baseline Vision/History: No visual deficits Patient Visual Report: No change from baseline     Perception     Praxis      Cognition Arousal/Alertness: Awake/alert Behavior During Therapy: WFL for tasks assessed/performed Overall Cognitive Status: Within Functional Limits for tasks assessed                                          Exercises     Shoulder Instructions       General Comments      Pertinent Vitals/ Pain       Pain Assessment: 0-10 Pain Score: 3  Pain Location: L residual limb Pain Descriptors / Indicators: Discomfort;Sore Pain Intervention(s): Monitored during session;Repositioned  Home Living                                          Prior Functioning/Environment  Frequency  Min 2X/week        Progress Toward Goals  OT Goals(current goals can now be found in the care plan section)  Progress towards OT goals: Progressing toward goals  Acute Rehab OT Goals Patient Stated Goal: to go home  Plan Discharge plan remains appropriate    Co-evaluation                 AM-PAC OT "6 Clicks" Daily Activity     Outcome Measure   Help from another person eating meals?: None Help from another person taking care of personal grooming?: None Help from another person toileting, which includes using toliet, bedpan, or urinal?: A Little Help from another person bathing (including washing, rinsing, drying)?: A Little Help from another person to put on and taking off regular upper body clothing?:  None Help from another person to put on and taking off regular lower body clothing?: A Little 6 Click Score: 21    End of Session Equipment Utilized During Treatment: Rolling walker;Gait belt;Other (comment) (L limb guard)  OT Visit Diagnosis: Other abnormalities of gait and mobility (R26.89);Muscle weakness (generalized) (M62.81) Pain - Right/Left: Left Pain - part of body: Leg   Activity Tolerance Patient tolerated treatment well   Patient Left in bed;with call bell/phone within reach   Nurse Communication          Time: 3419-3790 OT Time Calculation (min): 20 min  Charges: OT General Charges $OT Visit: 1 Visit OT Treatments $Self Care/Home Management : 8-22 mins     Margaretmary Eddy [facility] 05/28/2021, 11:24 AM

## 2021-05-29 ENCOUNTER — Other Ambulatory Visit: Payer: Self-pay

## 2021-05-31 ENCOUNTER — Other Ambulatory Visit (HOSPITAL_BASED_OUTPATIENT_CLINIC_OR_DEPARTMENT_OTHER): Payer: Self-pay

## 2021-06-03 ENCOUNTER — Other Ambulatory Visit (HOSPITAL_COMMUNITY): Payer: Self-pay

## 2021-06-03 ENCOUNTER — Telehealth (HOSPITAL_COMMUNITY): Payer: Self-pay

## 2021-06-03 NOTE — Telephone Encounter (Signed)
Pharmacy Transitions of Care Follow-up Telephone Call  Date of discharge: 05/28/21  Discharge Diagnosis: thrombosis of splenic artery  How have you been since you were released from the hospital?  Patient doing okay since discharge. No questions about meds at this time.  Medication changes made at discharge:      START taking: Acetaminophen Extra Strength (acetaminophen)  gabapentin (NEURONTIN)  nicotine (NICODERM CQ - dosed in mg/24 hours)  oxyCODONE (Oxy IR/ROXICODONE)   CHANGE how you take: Aspirin Low Dose (aspirin)   STOP taking: doxycycline 100 MG tablet (VIBRA-TABS)  nicotine 14 mg/24hr patch (NICODERM CQ - dosed in mg/24 hours)  Medication changes verified by the patient? Yes    Medication Accessibility:  Home Pharmacy:  [facility]  Was the patient provided with refills on discharged medications? Yes   Have all prescriptions been transferred from [facility] to home pharmacy?  Leaving in system for when he needs fill at [facility]  Is the patient able to afford medications? Patient has [place] Medicaid    Medication Review:  APIXABAN (ELIQUIS)  Apixaban 5 mg BID initiated on 05/28/21.  - Discussed importance of taking medication around the same time everyday  - Advised patient of medications to avoid (NSAIDs, ASA)  - Educated that Tylenol (acetaminophen) will be the preferred analgesic to prevent risk of bleeding  - Emphasized importance of monitoring for signs and symptoms of bleeding (abnormal bruising, prolonged bleeding, nose bleeds, bleeding from gums, discolored urine, black tarry stools)  - Advised patient to alert all providers of anticoagulation therapy prior to starting a new medication or having a procedure   Follow-up Appointments:  PCP Hospital f/u appt confirmed?  Scheduled to see Dr. Elliot Gurney on 06/05/21 @ [facility]   Specialist Hospital f/u appt confirmed?  Vascular Surgery Appointment on 06/26/21   If their condition worsens, is the pt aware to  call PCP or go to the Emergency Dept.? Yes  Final Patient Assessment: Patient has refills at home pharmacy and follow up scheduled.

## 2021-06-05 ENCOUNTER — Ambulatory Visit: Payer: Medicaid Other | Admitting: Student

## 2021-06-07 NOTE — Progress Notes (Deleted)
    SUBJECTIVE:   CHIEF COMPLAINT / HPI: hospital follow-up  Patient was admitted from 7/20-7/26 for lower limb ischemia. He underwent BKA of the left lower extremity on 7/22 due to left peroneal artery bypass graft occlusion (initially done 4/22) which was confirmed on duplex US and ABI. He had cellulitis treated with IV piperacillin-tazobactam. Discharge recommendations:  1) Mr. Lewelling reports that he received the first dose of his pneumococcal vaccine series in clinic.  He will need a 23-valent pneumococcal vaccine in eight weeks to complete his vaccination series. I do not see any record of his first pneumonia vaccine dose. 2) Mr. Sorbello reported that he had no nicotine cravings with nicotine patch in the hospital.  Consider ongoing counseling and therapeutic alliance toward the end goal of smoking cessation. 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  We started him on gabapentin 100 mg 3 times daily for this.  Consider continuing and possibly titrating this to treat his pain.  Today,   PERTINENT  PMH / PSH: PID, MI, CAD, splenic artery thrombosis, tobacco use  OBJECTIVE:   There were no vitals taken for this visit.  General: ***, NAD CV: RRR, no murmurs*** Pulm: CTAB, no wheezes or rales  ASSESSMENT/PLAN:   No problem-specific Assessment & Plan notes found for this encounter.   HCM   Littie Deeds, MD [facility]   {    This will disappear when note is signed, click to select method of visit    :1}

## 2021-06-12 ENCOUNTER — Ambulatory Visit: Payer: Medicaid Other | Admitting: Family Medicine

## 2021-06-12 NOTE — Progress Notes (Signed)
Received notification from [facility] [employer]) regarding approval for [employer]. Patient assistance approved from 06/07/21 to 06/06/22.  MEDICATION WILL SHIP TO PATIENTS HOME.   Phone: [phone]

## 2021-06-13 ENCOUNTER — Other Ambulatory Visit: Payer: Self-pay | Admitting: Physician Assistant

## 2021-06-13 MED ORDER — OXYCODONE-ACETAMINOPHEN 5-325 MG PO TABS: 1.0000 | ORAL_TABLET | Freq: Four times a day (QID) | ORAL | 0 refills | Status: DC | PRN

## 2021-06-13 NOTE — Progress Notes (Signed)
Pt is s/p left BKA on 05/24/2021 by Dr. Edilia Bo.  Pt calling in for pain medication.  He received 30 tablets at discharge.  I will send Percocet 5/325 one q6h prn pain #15 no refill.  We cannot give anymore pain medication after this refill unless he is seen in our office.    PDMP was reviewed.   Doreatha Massed, [facility] 06/13/2021 11:20 AM

## 2021-06-16 NOTE — Progress Notes (Deleted)
° ° °  SUBJECTIVE:  ° °CHIEF COMPLAINT / HPI:  ° °Patient admitted to hospital between ** and ** and treated for ***. Discharged on **. Presents today for follow up **. Reports improvement in ** Requires repeat ** BMP/ renal function panel/CBC today. ° ° °PERTINENT  PMH / PSH: *** ° °OBJECTIVE:  ° °There were no vitals taken for this visit.  ° °General: Alert, no acute distress °Cardio: Normal S1 and S2, RRR, no r/m/g °Pulm: CTAB, normal work of breathing °Abdomen: Bowel sounds normal. Abdomen soft and non-tender.  °Extremities: No peripheral edema.  °Neuro: Cranial nerves grossly intact ° ° °ASSESSMENT/PLAN:  ° °No problem-specific Assessment & Plan notes found for this encounter. °  ° ° °Karishma Unrein, MD °[facility]  ° °

## 2021-06-18 ENCOUNTER — Other Ambulatory Visit: Payer: Self-pay | Admitting: Family Medicine

## 2021-06-18 ENCOUNTER — Other Ambulatory Visit (HOSPITAL_COMMUNITY): Payer: Self-pay

## 2021-06-18 MED ORDER — GABAPENTIN 100 MG PO CAPS
100.0000 mg | ORAL_CAPSULE | Freq: Three times a day (TID) | ORAL | 0 refills | Status: DC
  Filled 2021-06-18: qty 90, 30d supply, fill #0

## 2021-06-19 ENCOUNTER — Other Ambulatory Visit (HOSPITAL_COMMUNITY): Payer: Self-pay

## 2021-06-20 ENCOUNTER — Ambulatory Visit: Payer: Medicaid Other

## 2021-06-26 ENCOUNTER — Ambulatory Visit (INDEPENDENT_AMBULATORY_CARE_PROVIDER_SITE_OTHER): Payer: Self-pay | Admitting: Physician Assistant

## 2021-06-26 ENCOUNTER — Other Ambulatory Visit: Payer: Self-pay

## 2021-06-26 ENCOUNTER — Other Ambulatory Visit (HOSPITAL_COMMUNITY): Payer: Self-pay

## 2021-06-26 ENCOUNTER — Other Ambulatory Visit: Payer: Self-pay | Admitting: Physician Assistant

## 2021-06-26 ENCOUNTER — Encounter: Payer: Self-pay | Admitting: Physician Assistant

## 2021-06-26 VITALS — BP 126/86 | HR 104 | Temp 97.9°F | Ht 66.0 in | Wt 151.8 lb

## 2021-06-26 DIAGNOSIS — I739 Peripheral vascular disease, unspecified: Secondary | ICD-10-CM

## 2021-06-26 DIAGNOSIS — Z89512 Acquired absence of left leg below knee: Secondary | ICD-10-CM

## 2021-06-26 MED ORDER — GABAPENTIN 300 MG PO CAPS
300.0000 mg | ORAL_CAPSULE | Freq: Three times a day (TID) | ORAL | 5 refills | Status: DC
  Filled 2021-06-26: qty 30, 10d supply, fill #0

## 2021-06-26 MED ORDER — GABAPENTIN 100 MG PO CAPS: 100.0000 mg | ORAL_CAPSULE | Freq: Three times a day (TID) | ORAL | 11 refills | Status: DC

## 2021-06-26 NOTE — Progress Notes (Signed)
  POST OPERATIVE OFFICE NOTE    CC:  F/u for surgery  HPI:  This is a 45 y.o. male who is s/p occluded left Bypass graft BKA on 05/24/21 by Dr. Edilia Bo.   He denise issues with his right LE.    Pt returns today for follow up.  Pt states he has no new complaints except for phantom pains and he is out of his Neurontin.  He ambulates with a walker for support.    No Known Allergies  Current Outpatient Medications  Medication Sig Dispense Refill   apixaban (ELIQUIS) 5 MG TABS tablet Take 1 tablet (5 mg total) by mouth 2 (two) times daily. 60 tablet 2   aspirin 81 MG EC tablet Take 1 tablet (81 mg total) by mouth daily. 90 tablet 1   gabapentin (NEURONTIN) 100 MG capsule Take 1 capsule (100 mg total) by mouth 3 (three) times daily. 90 capsule 0   nicotine (NICODERM CQ - DOSED IN MG/24 HOURS) 21 mg/24hr patch Place 1 patch (21 mg total) onto the skin daily. 28 patch 0   oxyCODONE (OXY IR/ROXICODONE) 5 MG immediate release tablet Take 1-2 tablets (5-10 mg total) by mouth every 4 (four) hours as needed for severe pain. 30 tablet 0   oxyCODONE-acetaminophen (PERCOCET) 5-325 MG tablet Take 1 tablet by mouth every 6 (six) hours as needed for severe pain. 15 tablet 0   rosuvastatin (CRESTOR) 20 MG tablet Take 1 tablet (20 mg total) by mouth daily. 90 tablet 1   senna-docusate (SENOKOT-S) 8.6-50 MG tablet Take 1 tablet by mouth 2 (two) times daily.     acetaminophen (TYLENOL) 500 MG tablet Take 2 tablets (1,000 mg total) by mouth every 6 (six) hours. (Patient not taking: Reported on 06/26/2021) 30 tablet 0   No current facility-administered medications for this visit.     ROS:  See HPI  Physical Exam:     Incision:  well healed with viable left BKA Extremities:  warm to touch    Assessment/Plan:  This is a 45 y.o. male who is s/p:occluded left LE bypass BKA for non healing wounds.  Staples were removed today.  The patient has a left Below Knee Amputation. The patient is well motivated to  return to their prior functional status by utilizing a prosthesis to perform ADL's and maintain a healthy lifestyle. The patient has the physical and cognitive capacity to function with a prosthesis.   Functional Level: K3 Community Ambulator: Has the ability or potential for ambulation with variable cadence, to traverse most environmental barriers, and may have vocational, therapeutic, or exercise activity that demands prosthetic utilization beyond simple locomotion. Pt may benefit from Human resources officer.   Residual Limb History: The skin condition of the residual limb is good. The patient will continue to monitor the skin of the residual limb and follow hygiene instructions.  The patient is experiencing phantom limb pain   Prosthetic Prescription Plan: Counseling and education regarding prosthetic management will be provided to the patient via a certified prosthetist. A multi-discipline team, including physical therapy, will manage the prosthetic fabrication, fitting and prosthetic gait training.    He will f/u in 1 year for right LE surveillance ABI.   Mosetta Pigeon PA-C [facility] [phone]   Clinic MD:  Edilia Bo

## 2021-11-15 ENCOUNTER — Other Ambulatory Visit: Payer: Self-pay

## 2021-11-15 ENCOUNTER — Ambulatory Visit (INDEPENDENT_AMBULATORY_CARE_PROVIDER_SITE_OTHER): Payer: Medicaid Other | Admitting: Family Medicine

## 2021-11-15 ENCOUNTER — Encounter: Payer: Self-pay | Admitting: Family Medicine

## 2021-11-15 VITALS — BP 102/64 | HR 112 | Ht 66.0 in | Wt 159.0 lb

## 2021-11-15 DIAGNOSIS — M79605 Pain in left leg: Secondary | ICD-10-CM | POA: Diagnosis not present

## 2021-11-15 DIAGNOSIS — R58 Hemorrhage, not elsewhere classified: Secondary | ICD-10-CM | POA: Diagnosis not present

## 2021-11-15 DIAGNOSIS — M79609 Pain in unspecified limb: Secondary | ICD-10-CM | POA: Insufficient documentation

## 2021-11-15 DIAGNOSIS — I739 Peripheral vascular disease, unspecified: Secondary | ICD-10-CM

## 2021-11-15 DIAGNOSIS — E785 Hyperlipidemia, unspecified: Secondary | ICD-10-CM

## 2021-11-15 MED ORDER — GABAPENTIN 300 MG PO CAPS: 300.0000 mg | ORAL_CAPSULE | Freq: Three times a day (TID) | ORAL | 5 refills | Status: DC

## 2021-11-15 MED ORDER — ROSUVASTATIN CALCIUM 20 MG PO TABS: 20.0000 mg | ORAL_TABLET | Freq: Every day | ORAL | 1 refills | Status: DC

## 2021-11-15 NOTE — Assessment & Plan Note (Signed)
-  phantom limb pain, increased gabapentin to 300 mg tid

## 2021-11-15 NOTE — Assessment & Plan Note (Addendum)
-  likely secondary to recent injury, seems to be in the process of healing, no systemic symptoms to indicate sign of infection  -return precautions discussed

## 2021-11-15 NOTE — Progress Notes (Signed)
° ° °  SUBJECTIVE:   CHIEF COMPLAINT / HPI:   Patient s/p left BKA bypass graft presents for medication refills along with concerns as noted below.   Limb pain Feels like his leg is on fire for about a month. Denies numbness but endorsing tingling and burning sensation. Sometimes feels like a burning and other times feels like someone is squeezing his foot. Compliant on gabapentin 100 mg tid but feels that it is not helping the pain improve.   Bruise Notes a bruise directly under his right knee. Denies any trauma or injury that he is certain off although grandson runs in and out of room and thinks he may have accidentally hit it. Denies any recent falls. Denies fever or chills. Does not remember when this occurred but notice it when he was taking a shower a week ago. Wants to have it checked since he is here, not bothering him otherwise.   OBJECTIVE:   BP 102/64    Pulse (!) 112    Ht 5\' 6"  (1.676 m)    Wt 159 lb (72.1 kg)    SpO2 98%    BMI 25.66 kg/m   General: Patient well-appearing, in no acute distress. CV: RRR, no murmurs or gallops auscultated Resp: CTAB, no wheezing, rales and rhonchi  Ext: left BKA with 2 cm ecchymosis noted anteriorly, no edema noted, no drainage or active bleeding noted  Neuro: ambulates with assistance of walker   ASSESSMENT/PLAN:   Ecchymosis -likely secondary to recent injury, seems to be in the process of healing, no systemic symptoms to indicate sign of infection  -return precautions discussed  Dyslipidemia (high LDL; low HDL) -refills of crestor provided  -repeat lipid panel in 2-3 months  Limb pain -phantom limb pain, increased gabapentin to 300 mg tid     -Follow up in 2-3 months for physical  -PHQ-9 score of 9 with negative question 9 reviewed.  -Med rec reviewed and updated appropriately.   , DO [facility]

## 2021-11-15 NOTE — Patient Instructions (Addendum)
It was great seeing you today!  It seems like your bruise is healing and does not look infected. If you notice any fever, chills, drainage or worsening skin changes then please contact our office.  I have increased your gabapentin to 300 mg, please take this 3 times daily. I have also provided you with refills on your crestor.   Please follow up at your next scheduled appointment in 2 months for a physical, if anything arises between now and then, please don't hesitate to contact our office.   Thank you for allowing [place] to be a part of your medical care!  Thank you, Dr. Robyne Peers

## 2021-11-15 NOTE — Assessment & Plan Note (Signed)
-  refills of crestor provided  -repeat lipid panel in 2-3 months

## 2022-01-02 ENCOUNTER — Encounter: Payer: Medicaid Other | Admitting: Family Medicine

## 2022-01-03 ENCOUNTER — Telehealth: Payer: Self-pay

## 2022-01-03 ENCOUNTER — Encounter: Payer: Self-pay | Admitting: Family Medicine

## 2022-01-03 ENCOUNTER — Ambulatory Visit (INDEPENDENT_AMBULATORY_CARE_PROVIDER_SITE_OTHER): Payer: Medicaid Other | Admitting: Family Medicine

## 2022-01-03 ENCOUNTER — Other Ambulatory Visit: Payer: Self-pay

## 2022-01-03 VITALS — BP 115/80 | HR 100 | Ht 66.0 in | Wt 161.4 lb

## 2022-01-03 DIAGNOSIS — Z1211 Encounter for screening for malignant neoplasm of colon: Secondary | ICD-10-CM

## 2022-01-03 DIAGNOSIS — R4586 Emotional lability: Secondary | ICD-10-CM | POA: Diagnosis not present

## 2022-01-03 DIAGNOSIS — Z1159 Encounter for screening for other viral diseases: Secondary | ICD-10-CM

## 2022-01-03 DIAGNOSIS — E785 Hyperlipidemia, unspecified: Secondary | ICD-10-CM

## 2022-01-03 NOTE — Assessment & Plan Note (Signed)
-  obtained lipid panel today ?-continue crestor ?-lifestyle modifications  ?

## 2022-01-03 NOTE — Telephone Encounter (Signed)
Pt called to get shrinker rx sent to [facility]. He is not due for f/u unitl August and is aware. No other questions/concerns at this time. Rx has been sent.  ?

## 2022-01-03 NOTE — Assessment & Plan Note (Addendum)
-  PHQ-9 score of 11 with 1 answered for question 9 reviewed and discussed.  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

## 2022-01-03 NOTE — Patient Instructions (Addendum)
It was great seeing you today! ? ?Today we discussed your mood and did a complete exam.  ? ?Below is a list of therapists, please choose one when able to establish care with, if you are having thoughts of harming yourself, please call [phone].  ? ?I will let you know of any abnormal results from the blood work today.  ? ?I have placed a GI referral for colonoscopy screening, if you do not hear from them within 2 weeks, then please contact our office so that we can assist with scheduling.  ? ?Please follow up at your next scheduled appointment in 1  month, if anything arises between now and then, please don't hesitate to contact our office. ? ? ?Thank you for allowing [place] to be a part of your medical care! ? ?Thank you, ?Dr. Robyne Peers  ? ? ?Therapy and Counseling Resources ?Most providers on this list will take Medicaid. Patients with commercial insurance or Medicare should contact their insurance company to get a list of in network providers. ? ?[facility] (spanish speaking therapist available)(habla espanol)  ?[address], [place], [place] [address], [place] ?[email or web address] ?[phone] ? ?[facility] ?[address] [place], [place] [address] ?[phone] ? ?[facility] ? [address], Suite [place], [place] [address]      [phone] ? ?[facility] ?[address]  [place], [place] [address] ?[phone] ? ?[facility] ?[address]  [place], [place] [address]       [phone]    ? ?[facility] (virtual only) ?[phone] ? ?[facility] ?[address] [place], [place] [phone] ? ?[facility]:  [address] [place] [phone] ? ?[facility]:  ?Coralie Carpen [phone] ? ?[facility] (under & uninsured) ?[address]   [place] [phone]    [email or web address]   ? ?[facility] ?[address]  [place]    [phone] ? ?[facility] ?[facility] [address]     Phone:  [phone]     [facility]-  [address]  [phone]  ? ?[facility] ?[address] [place], [place] [phone] ? ?[facility]: [address] [place], [place], [place]  [phone]  ? ?[facility] (Spanish therapist) [email or web address]  ?[address] [place]  [address]   [place] [address]    [phone]   ? ?[facility]   ?[employer],  [place], [place]  [phone]  ? ?[facility]  ?[address] [place]  [phone] ? ?[facility]  ?[address] [place], [place]        [phone] ? ?Open Access/Walk In Clinic under & uninsured ? ?[facility]  ?[address] [place], [place] ?Front Line [phone] ?Crisis [phone] ? ?[facility] [address],  ?(Spanish)   [address], [place]: [phone]) 8:30 - 12; 1 - 2:30 ? ?[facility] [employer],  ?[address], [place]    [phone]):8:30 - 12; 2 - 3PM ? ?[facility] [employer],  ?[address],  [place]; [phone]):   Mon - Fri 8 AM - 5 PM ? ?[facility] ?[address] [place]  MWF 12:30 to 3:00 or call to  schedule an appointment  [phone] ? ?Specific Provider options ?[facility]  [email or web address] ?click on find a therapist  ?enter your zip code ?left side and select or tailor a therapist for your specific need.  ? ?[facility] Provider Directory ?[email or web address]  (Medicaid)   Follow all drop down to find a provider ? ?Social Support program ?[facility] ?[phone] I7437963 or PhotoS[employer].pl ?[address], [place], [place] Recovery support and educational  ? ?24- Hour Availability:  ? ?[facility]  ?[address] [place], [place] ?[place] [phone] ?Crisis [phone] ? ?[facility] [phone] ? ?[employer] [facility]  [phone]  ? ?[facility] [employer]  [phone] (after hours) ? ?[facility]/Mobile Crisis   [phone] ? ?[place] National Suicide Hotline  737-040-2578 Len Childs) ? ?Call 911 or go to emergency room ? ?[employer]  [phone]);  [place] and [place]  ? ?[facility]  ?[phone]); [place], [place], [place], [place], [place], [place], [place]  ?

## 2022-01-03 NOTE — Progress Notes (Signed)
? ? ?  SUBJECTIVE:  ? ?CHIEF COMPLAINT / HPI:  ? ?Patient presents for a physical.  ? ?Patient reports ongoing mood changes. Used to being able to work but now is stuck at home. He feels that he does not want to go out much and deal with people. Denies SI or attempts. He says sometimes he has bad days, has negative thoughts once a month since his leg amputation a year ago. Denies having true thoughts of harming himself though. He feels it is aggravating as he is used to getting things he needs and now requires someone to assist him. Denies having a support system, he has kids he talks to occasionally. Lives with step daughter which is going well. Feels that if he harmed himself, it would not solve his issues so does not have a plan. He has tried therapists and medication in the past but has not worked for him. He does not want to see a therapist, when encouraged he says that is why I talk to my kids. The pain his sometimes feels with his phantom limb also does not help his mood as well.  ? ?Hyperlipidemia ?Last lipid panel last year notable for LDL 100, cholesterol 148 and triglycerides 126. Complaint on crestor. Does not eat a balanced diet, often ends up eating frozen dinners for convenience.  ? ?OBJECTIVE:  ? ?BP 115/80   Pulse 100   Ht 5\' 6"  (1.676 m)   Wt 161 lb 6.4 oz (73.2 kg)   SpO2 97%   BMI 26.05 kg/m?   ?General: Patient well-groomed, in no acute distress. ?CV: RRR, no murmurs or gallops auscultated ?Resp: CTAB, no wheezing, rales or rhonchi ?Abdomen: soft, nontender, nondistended, presence of bowel sounds ?Ext: no right LE edema noted, left BKA ?Neuro: ambulates with assistance of walker ? ?ASSESSMENT/PLAN:  ? ?Mood changes ?-PHQ-9 score of 11 with 1 answered for question 9 reviewed and discussed.  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 ?Dyslipidemia (high LDL; low HDL) ?-obtained lipid panel today ?-continue crestor ?-lifestyle modifications  ?  ?Health maintenance ?-GI referral placed for colonioscopy screening ?-Hep C screening performed  ? ? ? , DO ?[facility]  ?

## 2022-01-04 ENCOUNTER — Other Ambulatory Visit: Payer: Self-pay | Admitting: Family Medicine

## 2022-01-04 LAB — LIPID PANEL
Chol/HDL Ratio: 6.5 ratio — ABNORMAL HIGH (ref 0.0–5.0)
Cholesterol, Total: 202 mg/dL — ABNORMAL HIGH (ref 100–199)
HDL: 31 mg/dL — ABNORMAL LOW (ref >39)
LDL Chol Calc (NIH): 145 mg/dL — ABNORMAL HIGH (ref 0–99)
Triglycerides: 143 mg/dL (ref 0–149)
VLDL Cholesterol Cal: 26 mg/dL (ref 5–40)

## 2022-01-04 LAB — HCV INTERPRETATION

## 2022-01-04 LAB — HCV AB W REFLEX TO QUANT PCR: HCV Ab: NONREACTIVE

## 2022-01-06 ENCOUNTER — Other Ambulatory Visit: Payer: Self-pay | Admitting: Family Medicine

## 2022-01-06 DIAGNOSIS — E785 Hyperlipidemia, unspecified: Secondary | ICD-10-CM

## 2022-01-06 MED ORDER — ROSUVASTATIN CALCIUM 40 MG PO TABS: 40.0000 mg | ORAL_TABLET | Freq: Every day | ORAL | 3 refills | Status: DC

## 2022-02-17 ENCOUNTER — Ambulatory Visit: Payer: Medicaid Other | Admitting: Family Medicine

## 2022-02-24 ENCOUNTER — Ambulatory Visit: Payer: Medicaid Other | Admitting: Family Medicine

## 2022-02-27 ENCOUNTER — Ambulatory Visit: Payer: Medicaid Other | Admitting: Family Medicine

## 2022-03-05 ENCOUNTER — Encounter: Payer: Self-pay | Admitting: Family Medicine

## 2022-03-05 ENCOUNTER — Ambulatory Visit (INDEPENDENT_AMBULATORY_CARE_PROVIDER_SITE_OTHER): Payer: Medicaid Other | Admitting: Family Medicine

## 2022-03-05 DIAGNOSIS — R4586 Emotional lability: Secondary | ICD-10-CM

## 2022-03-05 NOTE — Progress Notes (Signed)
? ? ?  SUBJECTIVE:  ? ?CHIEF COMPLAINT / HPI:  ? ?Patient presents for follow up for mood check. He reports that he is doing better in spite of having a recent death in the family, his grandfather. Shares that he has his days but otherwise he is okay. Denies ever having thoughts of harming himself or anyone else. Support system includes his family which he utilizes them a significant amount, also relies on his girlfriend who is helpful. Some days he sleeps all day and other nights gets adequate sleep. He has thought about participating in therapy but he says his family is sufficient, since he has people he feels comfortable talking to he needs therapy less. He is tried therapy before but is favoring against it. He has been antidepressant before, he has been on prozac in the past but he feels like he is not himself.  ? ?OBJECTIVE:  ? ?BP 126/89   Pulse 98   Ht 5\' 6"  (1.676 m)   Wt 155 lb (70.3 kg)   SpO2 100%   BMI 25.02 kg/m?   ?General: Patient well-appearing, in no acute distress. ?CV: RRR, no murmurs or gallops auscultated ?Resp: CTAB, no wheezing, rales or rhonchi noted  ?Psych: mood appropriate, pleasant, denies SI or HI without plan  ? ?ASSESSMENT/PLAN:  ? ?Mood changes ?-PHQ-9 score of 7 with negative question 9 reviewed and discussed.  ?-reassuringly patient doing better, reassurance provided and encouraged to utilize coping techniques and current support system in place  ?-patient politely declines both therapy and medication at this time, he will inform if he changes his mind regarding therapy. I agree with holding off on starting medication given patient's improvement. Therapy resource provided during last visit which patient still has.  ?-sleep hygiene discussed extensively ?-follow up in 1 month  ? ?-Discussed tobacco cessation, will continue to discuss this at future visits.  ? ?[place], DO ?[facility]  ?

## 2022-03-05 NOTE — Patient Instructions (Signed)
It was great seeing you today! ? ?Today we discussed your mood, I am glad that things are better and you are doing an exceptional job at coping. Please continue to utilize those coping techniques we discussed. Make sure to develop a bedtime routine to let your brain know you are getting ready for bed, avoid napping during the day and avoid screen time at least 20-30 minutes prior to bedtime. If you are not able to fall asleep after 15-20 minutes of laying in bed then get and briefly do something else before returning to bed to try to sleep again.  ? ?If you change your mind about going to see a therapist, please let me know.  ? ?Please follow up at your next scheduled appointment in 1 month, if anything arises between now and then, please don't hesitate to contact our office. ? ? ?Thank you for allowing [place] to be a part of your medical care! ? ?Thank you, ?Dr. Larae Grooms  ?

## 2022-03-05 NOTE — Assessment & Plan Note (Signed)
-  PHQ-9 score of 7 with negative question 9 reviewed and discussed.  ?-reassuringly patient doing better, reassurance provided and encouraged to utilize coping techniques and current support system in place  ?-patient politely declines both therapy and medication at this time, he will inform us if he changes his mind regarding therapy. I agree with holding off on starting medication given patient's improvement. Therapy resource provided during last visit which patient still has.  ?-sleep hygiene discussed extensively ?-follow up in 1 month  ?

## 2022-03-24 ENCOUNTER — Telehealth: Payer: Self-pay | Admitting: *Deleted

## 2022-03-24 NOTE — Telephone Encounter (Signed)
Pt called in stating dr told him to call in if he thinks his leg has an infection and that she would send him in antibiotics for it. Please advise and call pt back. Evan Rodriguez Bruna Potter, CMA

## 2022-03-28 NOTE — Telephone Encounter (Signed)
Patient has appt with Dr. Robyne Peers on 5/31. Aquilla Solian, CMA

## 2022-04-02 ENCOUNTER — Ambulatory Visit: Payer: Medicaid Other | Admitting: Family Medicine

## 2022-04-08 ENCOUNTER — Encounter: Payer: Self-pay | Admitting: *Deleted

## 2022-05-09 ENCOUNTER — Ambulatory Visit: Payer: Medicaid Other | Admitting: Family Medicine

## 2022-09-07 IMAGING — CR DG FOOT 2V*L*
2 series · 2 of 2 positions shown · non-contrast
Comparison: None.

CLINICAL DATA: Left great toe swelling and redness. Symptoms for 2
months.

EXAM:
LEFT FOOT - 2 VIEW

[x foot ap left]
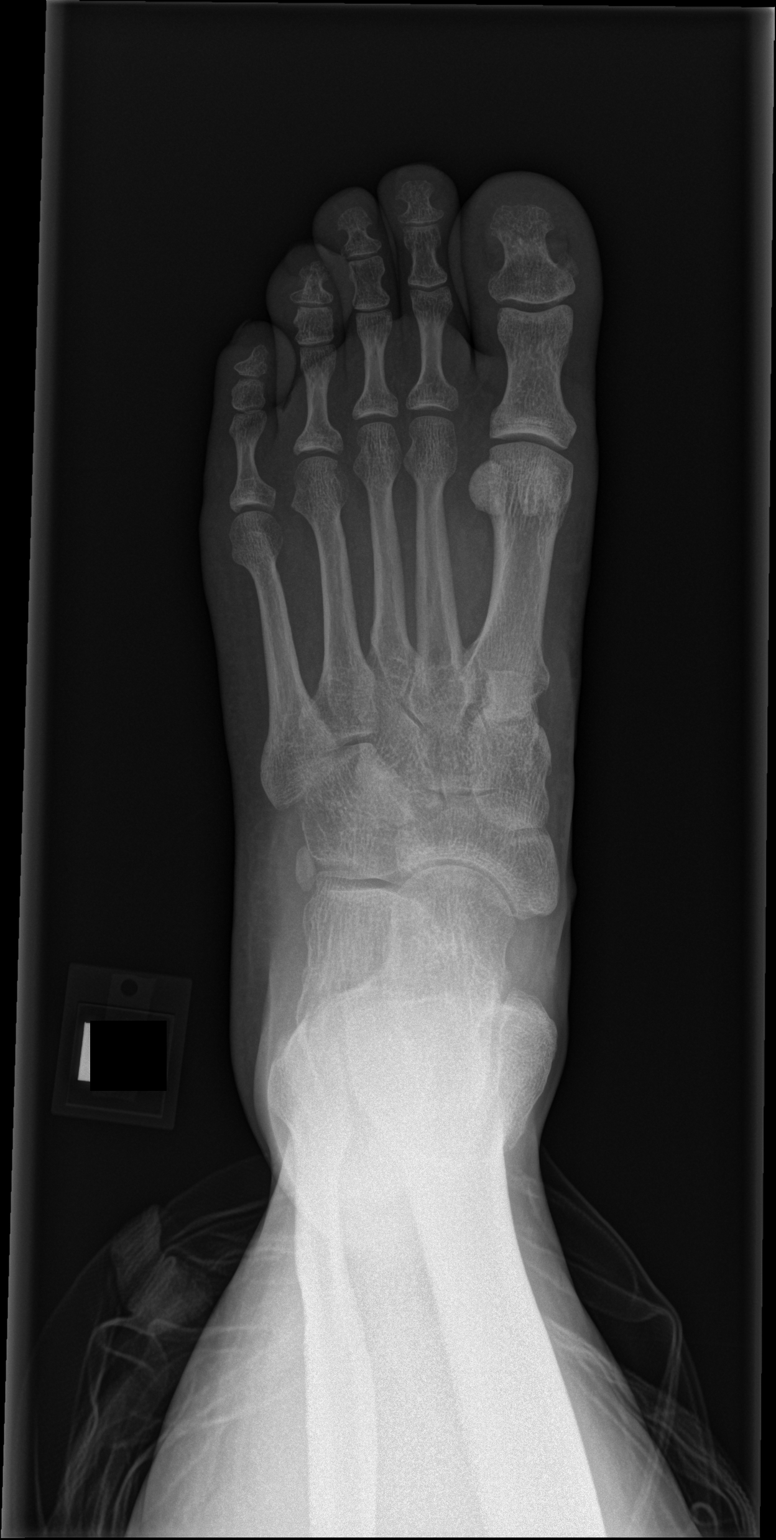

[x foot lat left]
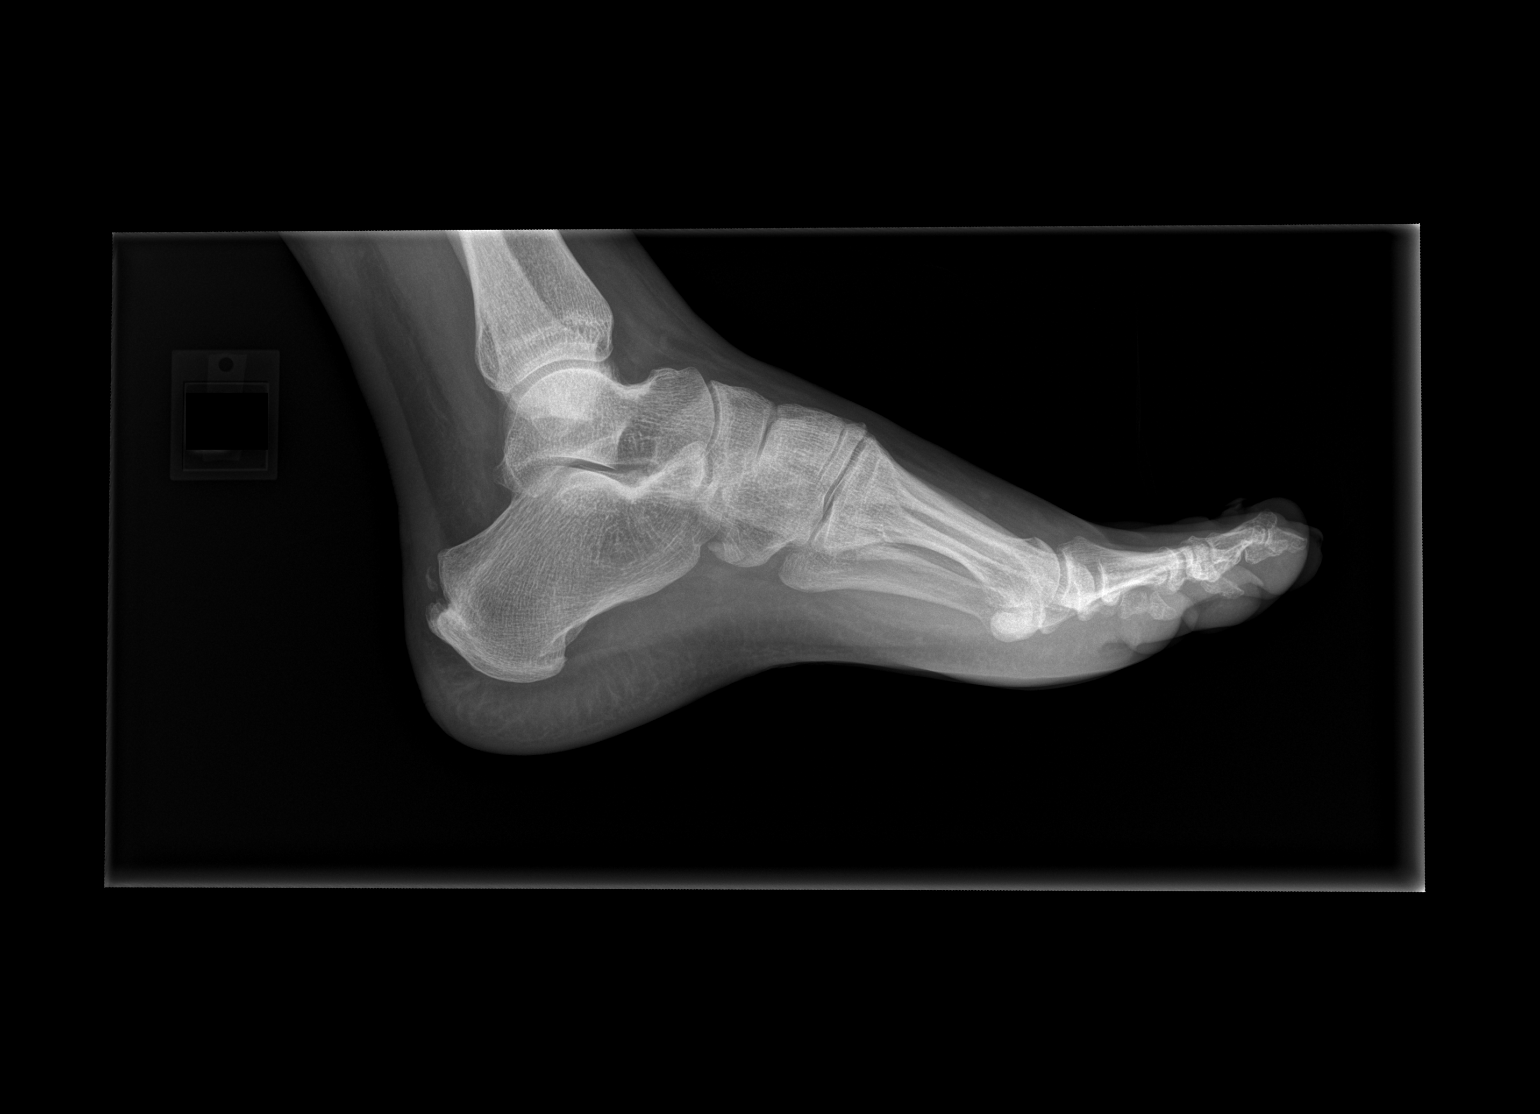

[2 of 2 positions shown; findings below may reference images not displayed]

FINDINGS: There is no evidence of fracture or dislocation. No erosion,
periosteal reaction, or bony destruction small Achilles tendon
enthesophyte. Soft tissues are unremarkable. No soft tissue air or
radiopaque foreign body.
IMPRESSION: 1. No explanation for toe swelling and redness.
2. Small plantar calcaneal spur. Otherwise unremarkable radiographs
of the left foot.

## 2022-10-28 ENCOUNTER — Other Ambulatory Visit: Payer: Self-pay

## 2022-11-14 IMAGING — DX DG ABD PORTABLE 1V
1 series · 1 of 1 positions shown · non-contrast
Comparison: 03/12/2021

CLINICAL DATA: Abdominal distension

EXAM:
PORTABLE ABDOMEN - 1 VIEW

[abdomen kub]
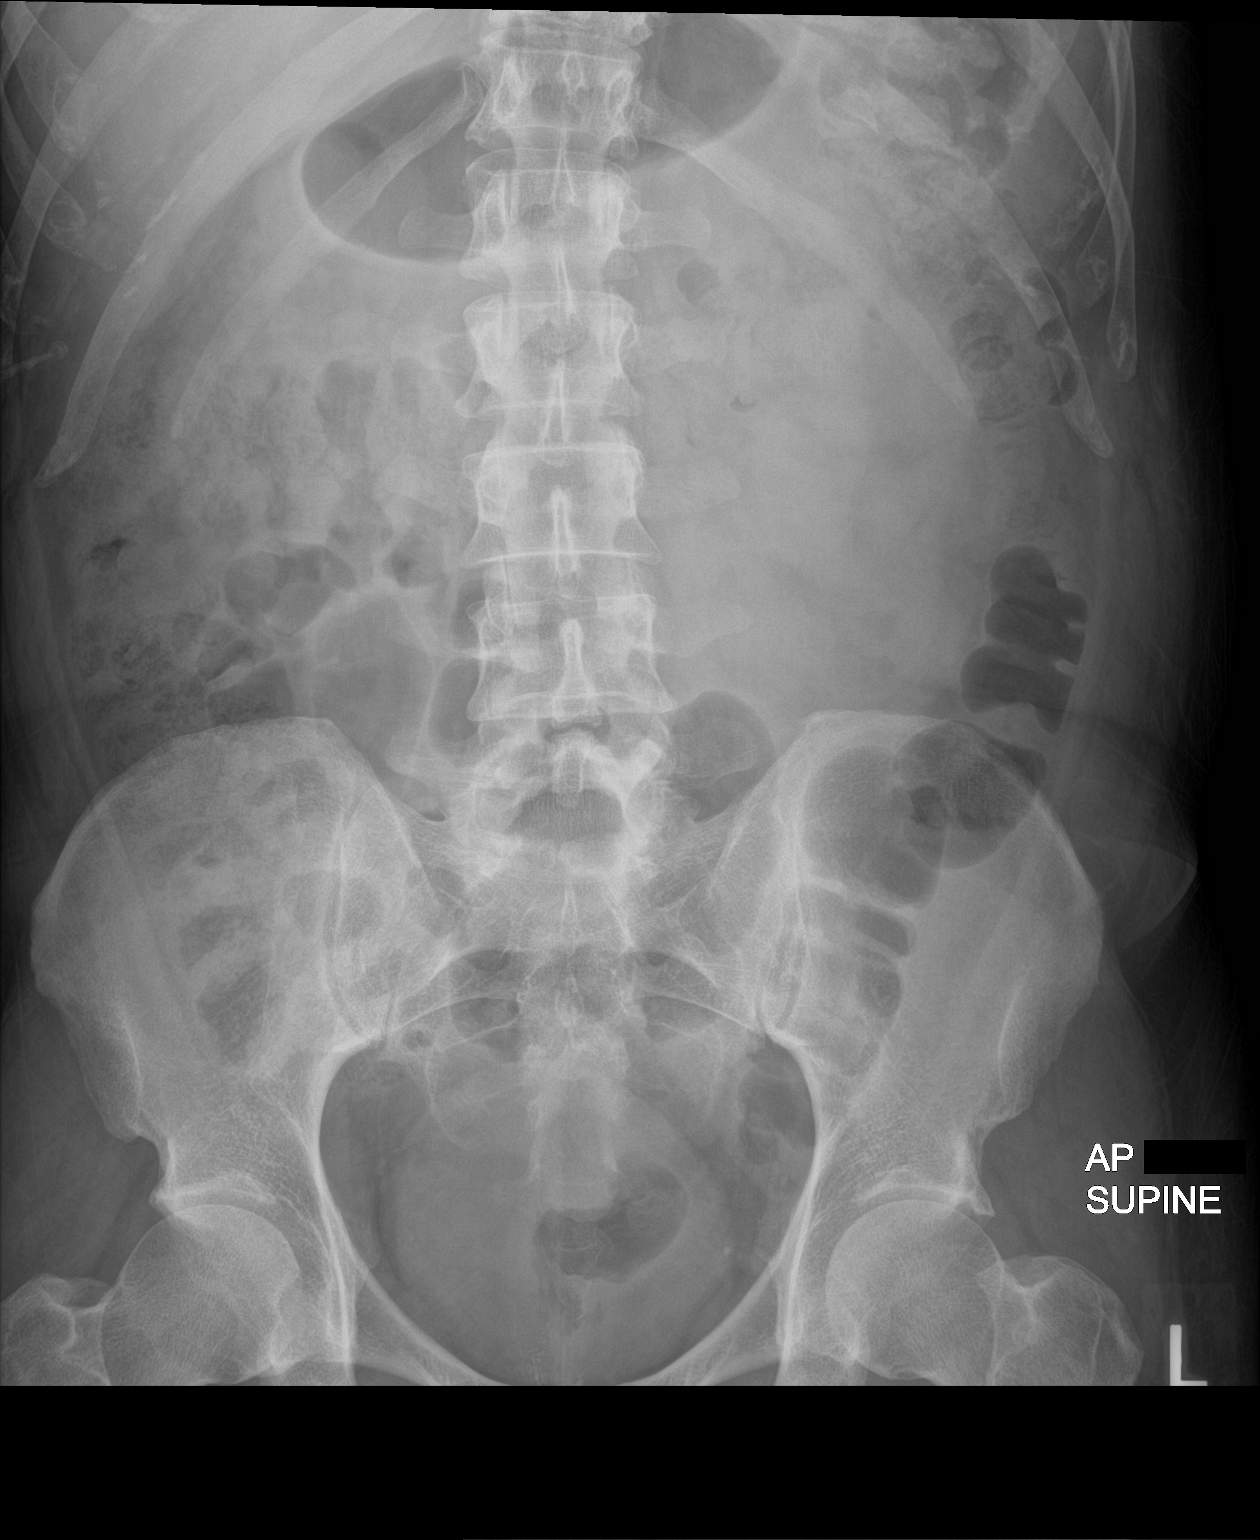

[1 of 1 positions shown; findings below may reference images not displayed]

FINDINGS: Scattered large and small bowel gas is noted. No obstructive changes
are seen. No free air is noted. No bony abnormality is seen.
IMPRESSION: No acute abnormality noted.

## 2023-01-05 ENCOUNTER — Other Ambulatory Visit: Payer: Self-pay | Admitting: *Deleted

## 2023-01-05 DIAGNOSIS — I739 Peripheral vascular disease, unspecified: Secondary | ICD-10-CM

## 2023-01-15 ENCOUNTER — Ambulatory Visit (HOSPITAL_COMMUNITY)
Admission: RE | Admit: 2023-01-15 | Discharge: 2023-01-15 | Disposition: A | Discharge status: Home / Self Care | Payer: Medicaid Other | Source: Ambulatory Visit | Attending: Vascular Surgery | Admitting: Vascular Surgery

## 2023-01-15 ENCOUNTER — Ambulatory Visit (INDEPENDENT_AMBULATORY_CARE_PROVIDER_SITE_OTHER): Payer: Medicaid Other | Admitting: Physician Assistant

## 2023-01-15 VITALS — BP 109/72 | HR 92 | Temp 98.0°F | Resp 20 | Ht 66.0 in

## 2023-01-15 DIAGNOSIS — Z89512 Acquired absence of left leg below knee: Secondary | ICD-10-CM

## 2023-01-15 DIAGNOSIS — I739 Peripheral vascular disease, unspecified: Secondary | ICD-10-CM | POA: Insufficient documentation

## 2023-01-15 NOTE — Progress Notes (Signed)
Office Note     CC:  follow up Requesting Provider:  Donney Dice, DO  HPI: Freddy Distefano is a 47 y.o. 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  Patient states left BKA stump has finally healed a large scab in the mid incision.  He is ready for a prosthetic leg.  He is ambulatory with a walker currently.  He denies any claudication, rest pain, or tissue loss of the right lower extremity.  He is on Eliquis and a statin daily.  He has discontinued aspirin on his own due to fear of taking aspirin with Eliquis.  He continues to smoke about a pack a day but is trying to quit.   Past Medical History:  Diagnosis Date   Coronary artery disease    NSTEMI (non-ST elevated myocardial infarction) ([place]) 02/22/2016   Stented   Peripheral vascular disease (H[facility])    Splenic vein thrombosis and artery thrombosis    Vaping nicotine dependence, non-tobacco product 01/22/2021    Past Surgical History:  Procedure Laterality Date   ABDOMINAL AORTOGRAM N/A 01/23/2021   Procedure: ABDOMINAL AORTOGRAM;  Surgeon: Cherre Robins, MD;  Location: [place] [facility];  Service: Cardiovascular;  Laterality: N/A;   AMPUTATION Left 05/24/2021   Procedure: LEFT LEG BELOW KNEE AMPUTATION;  Surgeon: Angelia Mould, MD;  Location: [place];  Service: Vascular;  Laterality: Left;   BYPASS GRAFT FEMORAL-PERONEAL Left 02/05/2021   Procedure: LEFT SUPERFICIAL FEMORAL-PERONEAL ARTERY BYPASS GRAFT WITH SUBFACIAL, NON-REVERSED  VEIN.;  Surgeon: Angelia Mould, MD;  Location: [place];  Service: Vascular;  Laterality: Left;   CARDIAC CATHETERIZATION N/A 02/22/2016   Procedure: Left Heart Cath and Coronary Angiography;  Surgeon: Sherren Mocha, MD;  Location: [place] [facility];  Service: Cardiovascular;  Laterality: N/A;   CARDIAC CATHETERIZATION N/A 02/22/2016   Procedure: Coronary Stent Intervention;  Surgeon: Sherren Mocha, MD;  Location: [place] [facility];   Service: Cardiovascular;  Laterality: N/A;   INTRAOPERATIVE ARTERIOGRAM Left 02/05/2021   Procedure: INTRA OPERATIVE ARTERIOGRAM- LEFT LOWER EXTREMITY;  Surgeon: Angelia Mould, MD;  Location: [place];  Service: Vascular;  Laterality: Left;   LOWER EXTREMITY ANGIOGRAPHY Left 01/23/2021   Procedure: Lower Extremity Angiography;  Surgeon: Cherre Robins, MD;  Location: [place] [facility];  Service: Cardiovascular;  Laterality: Left;   PERCUTANEOUS CORONARY STENT INTERVENTION (PCI-S)     WRIST SURGERY      Social History   Socioeconomic History   Marital status: Widowed    Spouse name: Not on file   Number of children: Not on file   Years of education: Not on file   Highest education level: Not on file  Occupational History   Occupation: Truck Geophysicist/field seismologist  Tobacco Use   Smoking status: Every Day    Packs/day: 1.00    Years: 20.00    Additional pack years: 0.00    Total pack years: 20.00    Types: Cigarettes    Passive exposure: Never   Smokeless tobacco: Former    Types: Chew    Quit date: 02/05/2019   Tobacco comments:    quit chew tobacco in 2020  Vaping Use   Vaping Use: Some days   Devices: [place]; refillable.   Substance and Sexual Activity   Alcohol use: Yes    Comment: occasionally/monthly   Drug use: No   Sexual activity: Not on file  Other Topics Concern   Not on file  Social  History Narrative   Not on file   Social Determinants of Health   Financial Resource Strain: Not on file  Food Insecurity: Not on file  Transportation Needs: Not on file  Physical Activity: Not on file  Stress: Not on file  Social Connections: Not on file  Intimate Partner Violence: Not on file    Family History  Problem Relation Age of Onset   Hypertension Maternal Grandfather     Current Outpatient Medications  Medication Sig Dispense Refill   apixaban (ELIQUIS) 5 MG TABS tablet Take 1 tablet (5 mg total) by mouth 2 (two) times daily. 60 tablet 2   gabapentin (NEURONTIN)  300 MG capsule Take 1 capsule (300 mg total) by mouth 3 (three) times daily. 30 capsule 5   rosuvastatin (CRESTOR) 40 MG tablet Take 1 tablet (40 mg total) by mouth daily. 90 tablet 3   aspirin 81 MG EC tablet Take 1 tablet (81 mg total) by mouth daily. (Patient not taking: Reported on 03/05/2022) 90 tablet 1   No current facility-administered medications for this visit.    No Known Allergies   REVIEW OF SYSTEMS:   '[X]'$  denotes positive finding, '[ ]'$  denotes negative finding Cardiac  Comments:  Chest pain or chest pressure:    Shortness of breath upon exertion:    Short of breath when lying flat:    Irregular heart rhythm:        Vascular    Pain in calf, thigh, or hip brought on by ambulation:    Pain in feet at night that wakes you up from your sleep:     Blood clot in your veins:    Leg swelling:         Pulmonary    Oxygen at home:    Productive cough:     Wheezing:         Neurologic    Sudden weakness in arms or legs:     Sudden numbness in arms or legs:     Sudden onset of difficulty speaking or slurred speech:    Temporary loss of vision in one eye:     Problems with dizziness:         Gastrointestinal    Blood in stool:     Vomited blood:         Genitourinary    Burning when urinating:     Blood in urine:        Psychiatric    Major depression:         Hematologic    Bleeding problems:    Problems with blood clotting too easily:        Skin    Rashes or ulcers:        Constitutional    Fever or chills:      PHYSICAL EXAMINATION:  Vitals:   01/15/23 1430  BP: 109/72  Pulse: 92  Resp: 20  Temp: 98 F (36.7 C)  TempSrc: Temporal  SpO2: 98%  Height: '5\' 6"'$  (1.676 m)    General:  WDWN in NAD; vital signs documented above Gait: Not observed HENT: WNL, normocephalic Pulmonary: normal non-labored breathing , without Rales, rhonchi,  wheezing Cardiac: regular HR Abdomen: soft, NT, no masses Skin: without rashes Vascular Exam/Pulses: 1+ right  PT pulse Extremities: without ischemic changes, without Gangrene , without cellulitis; without open wounds; left BKA completely healed Musculoskeletal: no muscle wasting or atrophy  Neurologic: A&O X 3;  No focal weakness or paresthesias are detected Psychiatric:  The pt has Normal affect.   Non-Invasive Vascular Imaging:   ABI/TBIToday's ABIToday's TBIPrevious ABIPrevious TBI  +-------+-----------+-----------+------------+------------+  Right 0.77       0.52       1.22        0.85          +-------+-----------+-----------+------------+------------+  Left  BKA        -          Ischemis    absent           ASSESSMENT/PLAN:: 47 y.o. male here for follow up for evaluation of left below the knee amputation and for surveillance of PAD in the right lower extremity  -Right lower extremity is without claudication, rest pain, or tissue loss.  ABI suggests occlusive disease however will not intervene because he is currently asymptomatic.  He will continue his Eliquis and statin daily.  I encouraged him to restart a baby aspirin daily.  Left BKA is well-healed with no wounds.  Patient is very active and able to ambulate with a walker without a prosthetic currently.  He will be referred to [facility] to be fitted for and prescribed a prosthetic left leg.    The patient has a left Below Knee Amputation. The patient is well motivated to return to their prior functional status by utilizing a prosthesis to perform ADL's and maintain a healthy lifestyle. The patient has the physical and cognitive capacity to function with a prosthesis.   Functional Level: [place] Limited Community Ambulator: Has the ability or potential for ambulation and to traverse low environmental barriers such as curbs, stairs or uneven surfaces  Residual Limb History: The skin condition of the residual limb is excellent. The patient will continue to monitor the skin of the residual limb and follow hygiene instructions.   The patient is experiencing no pain related to amputation  Prosthetic Prescription Plan: Counseling and education regarding prosthetic management will be provided to the patient via a certified prosthetist. A multi-discipline team, including physical therapy, will manage the prosthetic fabrication, fitting and prosthetic gait training.     Dagoberto Ligas, PA-C [facility] [phone]  Clinic MD:   Virl Cagey on call

## 2023-01-16 LAB — VAS US ABI WITH/WO TBI: Right ABI: 0.77

## 2023-01-21 IMAGING — CR DG FOOT COMPLETE 3+V*L*
3 series · 3 of 3 positions shown · non-contrast
Comparison: 01/21/2021

CLINICAL DATA: Left foot redness and swelling

EXAM:
LEFT FOOT - COMPLETE 3+ VIEW

[foot ap]
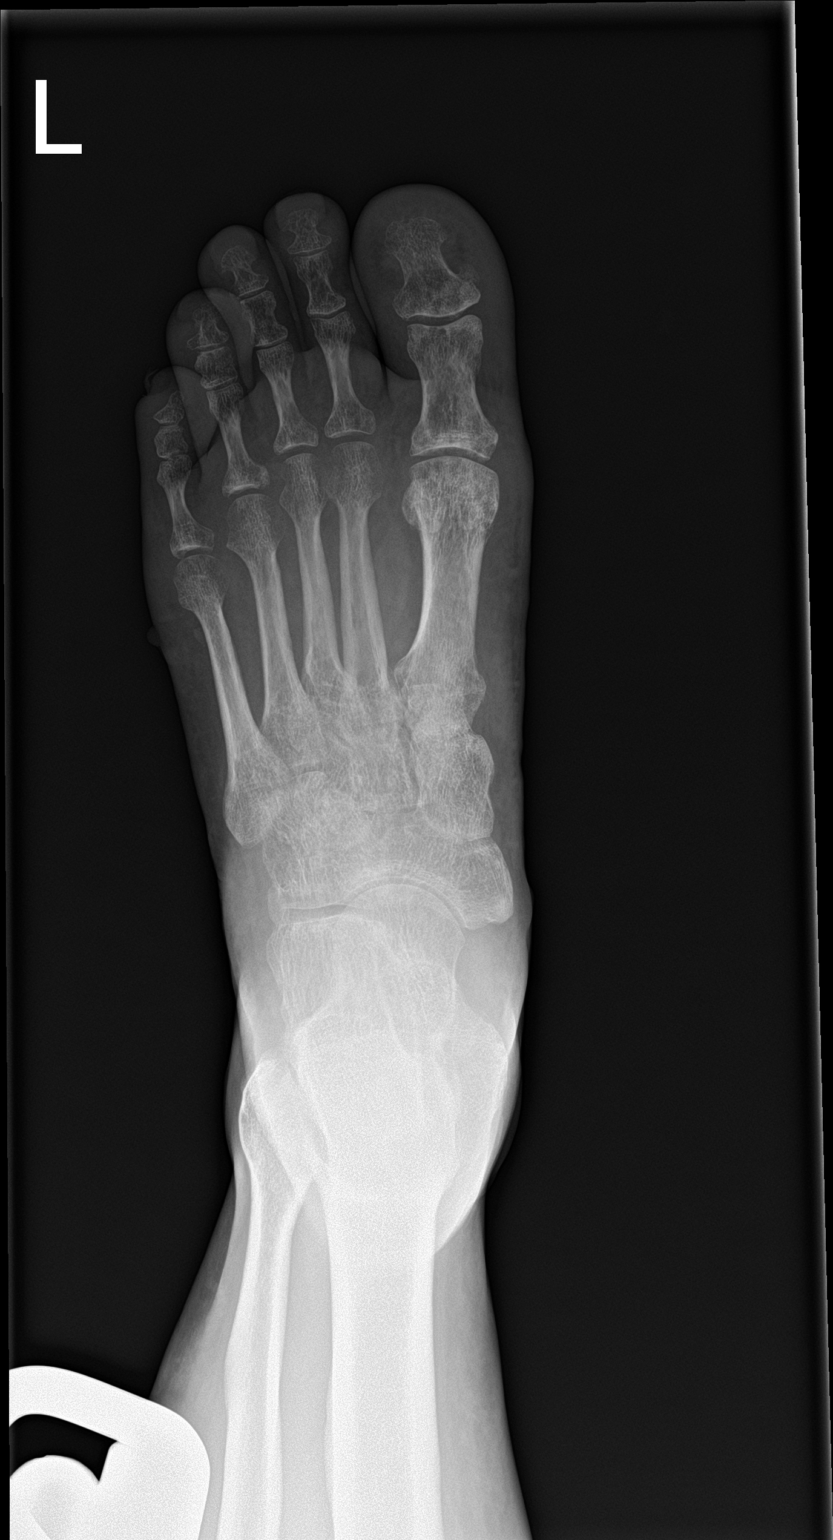

[foot obl]
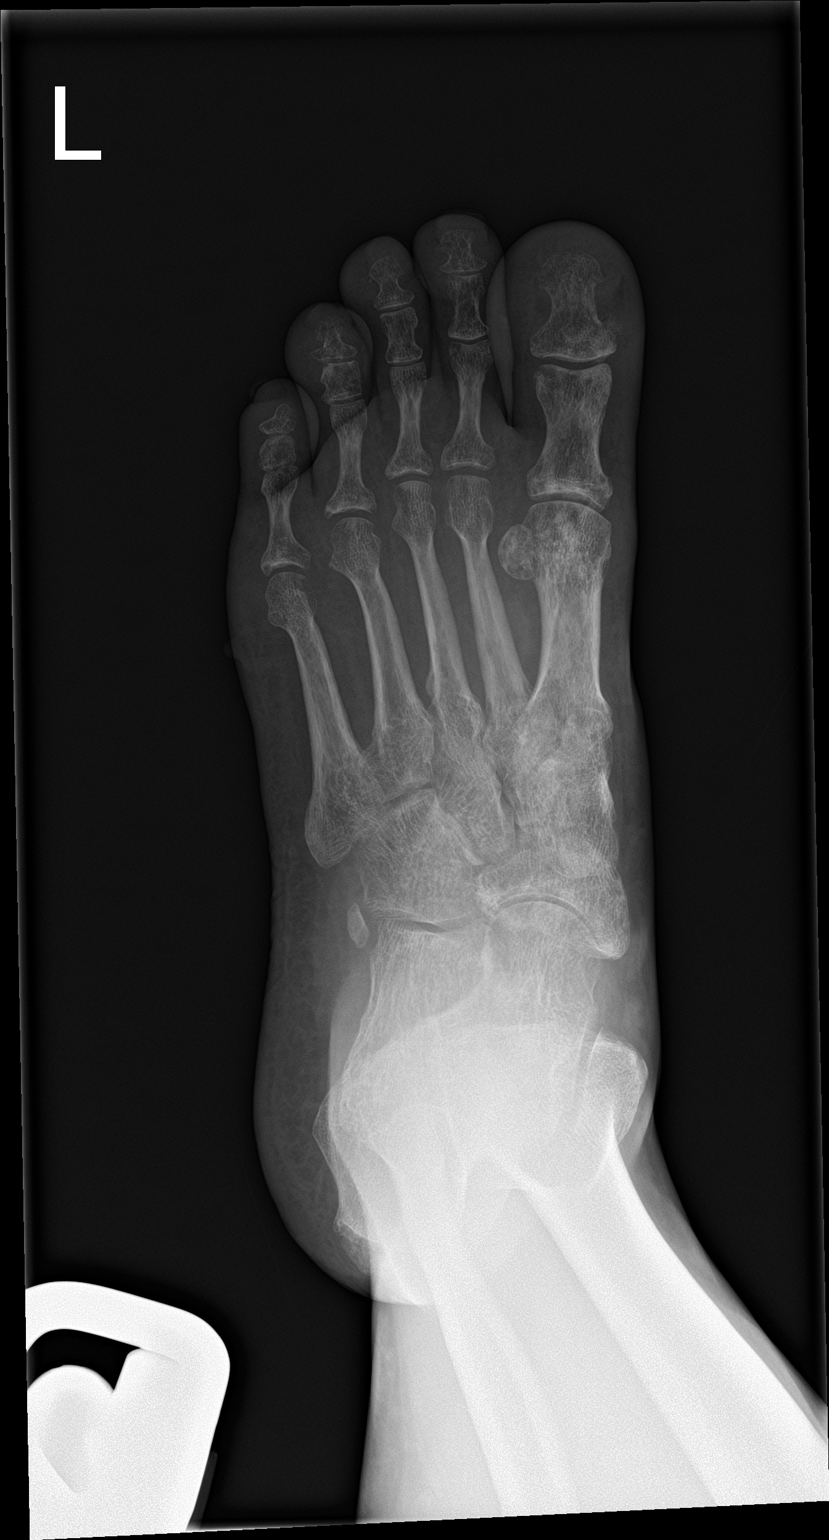

[foot lat]
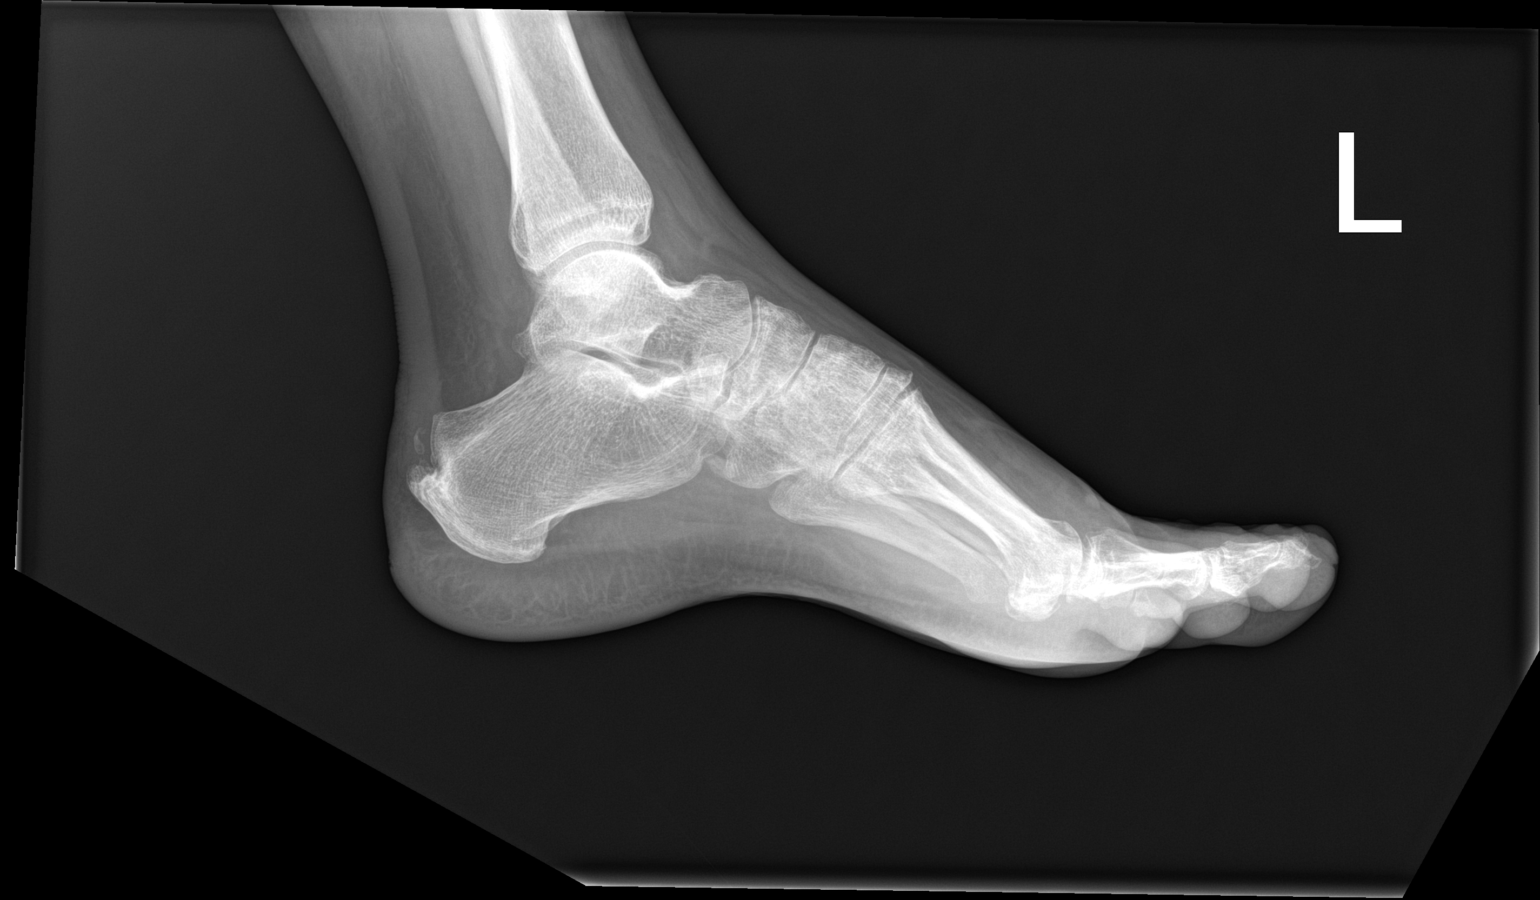

[3 of 3 positions shown; findings below may reference images not displayed]

FINDINGS: No fracture or malalignment. Interval heterogeneous lucencies and
mineralization throughout the foot, suspect related to osteopenia.
No frank osseous destructive change or periostitis. No soft tissue
emphysema.
IMPRESSION: 1. Interval heterogeneous lucency and mineralization throughout the
foot bones, potentially due to osteopenia. No definitive osseous
destructive change. MRI follow-up as indicated.

## 2023-02-23 ENCOUNTER — Telehealth: Payer: Self-pay

## 2023-02-23 NOTE — Telephone Encounter (Addendum)
Pt called requesting a letter.  Reviewed pt's chart, returned call for clarification, no answer, lf vm.  Pt called again, two identifiers used. Pt requested proof of disability for food stamps and other assistance. Instructed him to reach out to PCP. Confirmed understanding.

## 2023-02-24 ENCOUNTER — Encounter: Payer: Self-pay | Admitting: Family Medicine

## 2023-02-24 ENCOUNTER — Telehealth: Payer: Self-pay

## 2023-02-24 NOTE — Telephone Encounter (Signed)
Patient calls nurse line regarding letter needed to restart food stamps. He states that letter needs to state that he is disabled in order for him to receive this assistance.   Patient would like letter to be mailed to his home if provider is able to complete.   Veronda Prude, RN

## 2023-05-22 ENCOUNTER — Other Ambulatory Visit: Payer: Self-pay

## 2023-05-22 ENCOUNTER — Ambulatory Visit: Payer: Medicaid Other

## 2023-05-22 DIAGNOSIS — Z89511 Acquired absence of right leg below knee: Secondary | ICD-10-CM | POA: Diagnosis present

## 2023-05-22 DIAGNOSIS — M6281 Muscle weakness (generalized): Secondary | ICD-10-CM | POA: Insufficient documentation

## 2023-05-22 DIAGNOSIS — R2689 Other abnormalities of gait and mobility: Secondary | ICD-10-CM | POA: Diagnosis present

## 2023-05-22 NOTE — Therapy (Signed)
OUTPATIENT PHYSICAL THERAPY PROSTHETICS EVALUATION   Patient Name: Evan Rodriguez MRN: [id number] DOB:04-07-1976, 47 y.o., male Today's Date: 05/22/2023  PCP: Dr. Para March REFERRING PROVIDER: Dr. Alanson Aly  END OF SESSION:  PT End of Session - 05/22/23 1111     Visit Number 1    Number of Visits 13    Date for PT Re-Evaluation 08/14/23    Authorization Type [employer]    Authorization - Visit Number 1    Authorization - Number of Visits 4    Progress Note Due on Visit 4    PT Start Time 1115    PT Stop Time 1200    PT Time Calculation (min) 45 min    Equipment Utilized During Treatment Gait belt    Activity Tolerance Patient tolerated treatment well    Behavior During Therapy [facility] for tasks assessed/performed             Past Medical History:  Diagnosis Date   Coronary artery disease    NSTEMI (non-ST elevated myocardial infarction) (HCC) 02/22/2016   Stented   Peripheral vascular disease (HCC)    Splenic vein thrombosis and artery thrombosis    Vaping nicotine dependence, non-tobacco product 01/22/2021   Past Surgical History:  Procedure Laterality Date   ABDOMINAL AORTOGRAM N/A 01/23/2021   Procedure: ABDOMINAL AORTOGRAM;  Surgeon: Leonie Douglas, MD;  Location: [facility] INVASIVE CV LAB;  Service: Cardiovascular;  Laterality: N/A;   AMPUTATION Left 05/24/2021   Procedure: LEFT LEG BELOW KNEE AMPUTATION;  Surgeon: Chuck Hint, MD;  Location: [facility] OR;  Service: Vascular;  Laterality: Left;   BYPASS GRAFT FEMORAL-PERONEAL Left 02/05/2021   Procedure: LEFT SUPERFICIAL FEMORAL-PERONEAL ARTERY BYPASS GRAFT WITH SUBFACIAL, NON-REVERSED  VEIN.;  Surgeon: Chuck Hint, MD;  Location: [facility] OR;  Service: Vascular;  Laterality: Left;   CARDIAC CATHETERIZATION N/A 02/22/2016   Procedure: Left Heart Cath and Coronary Angiography;  Surgeon: Tonny Bollman, MD;  Location: [facility] INVASIVE CV LAB;  Service: Cardiovascular;  Laterality: N/A;    CARDIAC CATHETERIZATION N/A 02/22/2016   Procedure: Coronary Stent Intervention;  Surgeon: Tonny Bollman, MD;  Location: [facility] INVASIVE CV LAB;  Service: Cardiovascular;  Laterality: N/A;   INTRAOPERATIVE ARTERIOGRAM Left 02/05/2021   Procedure: INTRA OPERATIVE ARTERIOGRAM- LEFT LOWER EXTREMITY;  Surgeon: Chuck Hint, MD;  Location: [facility] OR;  Service: Vascular;  Laterality: Left;   LOWER EXTREMITY ANGIOGRAPHY Left 01/23/2021   Procedure: Lower Extremity Angiography;  Surgeon: Leonie Douglas, MD;  Location: [facility] INVASIVE CV LAB;  Service: Cardiovascular;  Laterality: Left;   PERCUTANEOUS CORONARY STENT INTERVENTION (PCI-S)     WRIST SURGERY     Patient Active Problem List   Diagnosis Date Noted   Mood changes 01/03/2022   Ecchymosis 11/15/2021   Limb pain 11/15/2021   Cellulitis of left lower extremity 05/22/2021   Lower limb ischemia 05/21/2021   Pain and swelling of left lower leg 05/16/2021   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   Coronary artery disease involving native coronary artery of native heart without angina pectoris 05/09/2021   Onychomycosis of toenail 03/25/2021   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  REFERRING DIAG: left BKA  PT  THERAPY DIAG:  Hx of BKA, right (HCC)  Other abnormalities of gait and mobility  Muscle weakness (generalized)  Balance disorder  Rationale for Evaluation and Treatment: Rehabilitation  SUBJECTIVE:   SUBJECTIVE  STATEMENT: Pt had R BKA done with Dr. Edilia Bo on 2 years ago. Pt had an open sore on the residual leg and it took over 2 years for it to heal. He just received his prosthetic leg about 2 weeks ago from [employer]. He is working with Leontine Locket at [address] in [place]. Pt reports no pain on consistent basis. Pt reports intermittent phantom pain but not enough to wake him up. Pt is currently wearing 1 ply sock as he walked in. Pt walked in without AD. Pt was using RW at home without prosthetic leg prior and currently uses RW intermittently with prosthetic leg. Pt lives with daughter at home.  Pt accompanied by: self  PERTINENT HISTORY: R BKA, hx of heart attack 2017 pt has stents, pt is on blood thinners  PAIN:  Are you having pain? No  PRECAUTIONS: Fall  RED FLAGS: None   WEIGHT BEARING RESTRICTIONS: No  FALLS: Has patient fallen in last 6 months? Yes. Number of falls 1  LIVING ENVIRONMENT: Lives with: lives with their daughter Lives in: Mobile home Home Access: Ramped entrance Home layout: One level Stairs: Yes: External: 4 steps; on right going up, on left going up, and can reach both Has following equipment at home: Dan Humphreys - 2 wheeled and [employer] - 4 wheeled  PLOF: Independent  PATIENT GOALS: Walk better  OBJECTIVE:    COGNITION: Overall cognitive status: Within functional limits for tasks assessed  LOWER EXTREMITY ROM:  Active ROM Right eval Left eval  Hip flexion    Hip extension    Hip abduction    Hip adduction    Hip internal rotation    Hip external rotation    Knee flexion    Knee extension    Ankle dorsiflexion    Ankle plantarflexion    Ankle inversion    Ankle eversion     (Blank rows = not tested)  LOWER EXTREMITY MMT:  MMT Right eval Left eval  Hip flexion    Hip extension    Hip abduction    Hip adduction    Hip internal rotation    Hip external rotation    Knee flexion    Knee extension    Ankle dorsiflexion    Ankle plantarflexion     Ankle inversion    Ankle eversion    (Blank rows = not tested)  BED MOBILITY:  Sit to supine Complete Independence Supine to sit Complete Independence Rolling to Right Complete Independence Rolling to Left Complete Independence  TRANSFERS: Sit to stand: Complete Independence Stand to sit: Complete Independence    GAIT: Gait pattern: decreased arm swing- Right, decreased arm swing- Left, decreased step length- Right, decreased step length- Left, decreased stride length, antalgic, lateral lean- Right, lateral lean- Left, and wide BOS Distance walked: 460' Assistive device utilized: Single point cane Level of assistance: CGA   FUNCTIONAL TESTS:  Timed up and go (TUG): 25 sec with st. cane 10 meter walk test: 0.21 m/s with st. cane    CURRENT PROSTHETIC WEAR ASSESSMENT: Patient is independent with: skin check, residual limb care, and care of non-amputated limb Patient  is dependent with: residual limb care, prosthetic cleaning, ply sock cleaning, correct ply sock adjustment, proper wear schedule/adjustment, and proper weight-bearing schedule/adjustment Donning prosthesis: Complete Independence Doffing prosthesis: Complete Independence Prosthetic wear tolerance: 1 hours/day, 7 days/week Prosthetic weight bearing tolerance: 60 minutes Edema: none Residual limb condition: well nourished    TODAY'S TREATMENT:                                                                                                                                         DATE:  Prosthetic education: - discussed progressive wear time and prosthetic WB. Start out with 2 hours total (1 hr morning/ 1 hr evening) for next week and then progress it gradually over time until total wear time is 6-8 hours per day. - Pt educated on skin check - Pt educated on keeping a shoulder bag with couple of wash clothes and different prosthetic socks for sock adjustment. - pt educated on when he feels that residual leg is  bottomed out, trying to add 1 ply to see if helps with pain and wear tolerance. - pt educated on perspiration management but allowing residual leg to dry out in between wear times.  Gait training: educated patient on importance of using st. Point cane to decrease compensatory movements when walking without the AD and to improve gait mechanics and improve wear tolerance with prosthetic leg. Pt educated on 2 point gait pattern but currently is using 3 point gait pattern. 1 x 230'  Pt educated on obtaining st. Point cane with wide tip at the bottom for comfort. PATIENT EDUCATION: Education details: see above Person educated: Patient Education method: Explanation Education comprehension: verbalized understanding  HOME EXERCISE PROGRAM: TBD  ASSESSMENT:  CLINICAL IMPRESSION: Patient is a 47 y.o. male who was seen today for physical therapy evaluation and treatment for gait and mobility training after recently receiving prosthetic leg for his R BKA, about 2 weeks prior. Patient is currently wearing prosthetic leg for upto one hour per day. Pt is using RW at home but came in therapy session without any AD. Objective findings from today's session concluded that patient is a high risk for fall, based on gait speed and Timed up and Go test. Patient was educated on use of st point cane with prosthetic use to improve safety, gait mechanics, safety and prosthetic wear tolerance. Patient will benefit from skilled PT to further educate him on proper prosthetic leg care, gait training, balance training, residual leg care and overall safety.   OBJECTIVE IMPAIRMENTS: Abnormal gait, decreased activity tolerance, decreased balance, decreased endurance, decreased mobility, difficulty walking, impaired sensation, prosthetic dependency , and pain.   ACTIVITY LIMITATIONS: carrying, lifting, bending, standing, squatting, and stairs  PARTICIPATION LIMITATIONS: meal prep, cleaning, shopping, community activity, and  yard work  PERSONAL FACTORS: Time since onset of injury/illness/exacerbation are also affecting patient's functional outcome.   REHAB POTENTIAL: Good  CLINICAL DECISION MAKING:  Stable/uncomplicated  EVALUATION COMPLEXITY: Low   GOALS: Goals reviewed with patient? Yes  SHORT TERM GOALS: Target date: 06/19/2023    Patient will obtain cane and report at least 75% complieance of use of cane with in home and community mobility. Baseline: is currently using RW or no AD with mobility with prosthetic leg (05/22/23) Goal status: INITIAL  2.  Pt will be able to tolerate wear of prosthetic leg for at least 3 hours total per day to improve wear tolerance. Baseline: 1 hour (05/22/23) Goal status: INITIAL   LONG TERM GOALS: Target date: 08/14/2023    Patient will dem gait speed of >0.8 m/s with or without AD to improve community ambulation and decrease fall risk.  Baseline: 0.21 m/s with st. Cane (05/22/23) Goal status: INITIAL  2.  Pt will demo TUG score of <14 sec with or without AD to improve functional mobility and reduce fall risk. Baseline: 25 sec with st. Cane (05/22/23) Goal status: INITIAL  3.  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 Baseline: not attempted (05/22/23) Goal status: INITIAL  4.  Pt will be I and compliant with HEP to self manage prosthetic wear/care schedule and symptoms.  Baseline: Initiated (05/22/23) Goal status: INITIAL  5.  Pt will be able to use prosthetic leg for >6 hours per day to improve functional mobility in home and community.  Baseline: 1 hour wear tolerance (05/22/23) Goal status: INITIAL    PLAN:  PT FREQUENCY: 1-2x/week  PT DURATION: 12 weeks  PLANNED INTERVENTIONS: Therapeutic exercises, Therapeutic activity, Neuromuscular re-education, Balance training, Gait training, Patient/Family education, Self Care, Joint mobilization, Stair training, Prosthetic training,  Cryotherapy, Moist heat, Manual therapy, and Re-evaluation  PLAN FOR NEXT SESSION: Review sock adjustment, cleaning of prosthetis, residual leg care, Continue to practice st. Cane training with functional gait (get him to use 2 point gait pattern instead of 3 point). Progress with dynamic balance activities.   Check all possible CPT codes: 65784 - PT Re-evaluation, 97110- Therapeutic Exercise, [phone]- Neuro Re-education, [phone] - Gait Training, [phone] - Manual Therapy, [phone] - Therapeutic Activities, [phone] - Self Care, and [phone] - Prosthetic training    Check all conditions that are expected to impact treatment: {Conditions expected to impact treatment:Musculoskeletal disorders   If treatment provided at initial evaluation, no treatment charged due to lack of authorization.      Ileana Ladd, PT 05/22/2023, 12:36 PM

## 2023-05-29 ENCOUNTER — Telehealth: Payer: Self-pay

## 2023-05-29 ENCOUNTER — Ambulatory Visit: Payer: Medicaid Other

## 2023-05-29 NOTE — Telephone Encounter (Signed)
Patient Name: Evan Rodriguez MRN: [id number] DOB:10-Sep-1976, 47 y.o., male Today's Date: 05/29/2023  Pt was called and left VM to remind him that he missed his PT appt today at 11:45am. Patient was reminded of his next appt and our cancellations/no show policy.    Ileana Ladd, PT 05/29/2023, 12:48 PM

## 2023-06-05 ENCOUNTER — Ambulatory Visit: Payer: Medicaid Other | Attending: Surgery

## 2023-06-05 DIAGNOSIS — M6281 Muscle weakness (generalized): Secondary | ICD-10-CM | POA: Diagnosis present

## 2023-06-05 DIAGNOSIS — Z89511 Acquired absence of right leg below knee: Secondary | ICD-10-CM | POA: Insufficient documentation

## 2023-06-05 DIAGNOSIS — R2689 Other abnormalities of gait and mobility: Secondary | ICD-10-CM | POA: Diagnosis present

## 2023-06-05 NOTE — Therapy (Signed)
OUTPATIENT PHYSICAL THERAPY PROSTHETICS TREATMENT NOTE   Patient Name: Evan Rodriguez MRN: [id number] DOB:10/18/1976, 47 y.o., male Today's Date: 06/05/2023  PCP: Dr. Para March REFERRING PROVIDER: Dr. Alanson Aly  END OF SESSION:  PT End of Session - 06/05/23 1158     Visit Number 2    Number of Visits 8    Date for PT Re-Evaluation 08/14/23    Authorization Type [employer] 7 visits approved from 05/22/23-07/20/23    PT Start Time 1200    PT Stop Time 1245    PT Time Calculation (min) 45 min    Equipment Utilized During Treatment Gait belt    Activity Tolerance Patient tolerated treatment well    Behavior During Therapy [facility] for tasks assessed/performed             Past Medical History:  Diagnosis Date   Coronary artery disease    NSTEMI (non-ST elevated myocardial infarction) (HCC) 02/22/2016   Stented   Peripheral vascular disease (HCC)    Splenic vein thrombosis and artery thrombosis    Vaping nicotine dependence, non-tobacco product 01/22/2021   Past Surgical History:  Procedure Laterality Date   ABDOMINAL AORTOGRAM N/A 01/23/2021   Procedure: ABDOMINAL AORTOGRAM;  Surgeon: Leonie Douglas, MD;  Location: [facility] INVASIVE CV LAB;  Service: Cardiovascular;  Laterality: N/A;   AMPUTATION Left 05/24/2021   Procedure: LEFT LEG BELOW KNEE AMPUTATION;  Surgeon: Chuck Hint, MD;  Location: [facility] OR;  Service: Vascular;  Laterality: Left;   BYPASS GRAFT FEMORAL-PERONEAL Left 02/05/2021   Procedure: LEFT SUPERFICIAL FEMORAL-PERONEAL ARTERY BYPASS GRAFT WITH SUBFACIAL, NON-REVERSED  VEIN.;  Surgeon: Chuck Hint, MD;  Location: [facility] OR;  Service: Vascular;  Laterality: Left;   CARDIAC CATHETERIZATION N/A 02/22/2016   Procedure: Left Heart Cath and Coronary Angiography;  Surgeon: Tonny Bollman, MD;  Location: [facility] INVASIVE CV LAB;  Service: Cardiovascular;  Laterality: N/A;   CARDIAC CATHETERIZATION N/A 02/22/2016   Procedure: Coronary Stent  Intervention;  Surgeon: Tonny Bollman, MD;  Location: [facility] INVASIVE CV LAB;  Service: Cardiovascular;  Laterality: N/A;   INTRAOPERATIVE ARTERIOGRAM Left 02/05/2021   Procedure: INTRA OPERATIVE ARTERIOGRAM- LEFT LOWER EXTREMITY;  Surgeon: Chuck Hint, MD;  Location: [facility] OR;  Service: Vascular;  Laterality: Left;   LOWER EXTREMITY ANGIOGRAPHY Left 01/23/2021   Procedure: Lower Extremity Angiography;  Surgeon: Leonie Douglas, MD;  Location: [facility] INVASIVE CV LAB;  Service: Cardiovascular;  Laterality: Left;   PERCUTANEOUS CORONARY STENT INTERVENTION (PCI-S)     WRIST SURGERY     Patient Active Problem List   Diagnosis Date Noted   Mood changes 01/03/2022   Ecchymosis 11/15/2021   Limb pain 11/15/2021   Cellulitis of left lower extremity 05/22/2021   Lower limb ischemia 05/21/2021   Pain and swelling of left lower leg 05/16/2021   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   Coronary artery disease involving native coronary artery of native heart without angina pectoris 05/09/2021   Onychomycosis of toenail 03/25/2021   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  REFERRING DIAG: left BKA  PT  THERAPY DIAG:  Hx of BKA, right (HCC)  Other abnormalities of gait and mobility  Muscle weakness (generalized)  Balance disorder  Rationale for Evaluation and Treatment: Rehabilitation  SUBJECTIVE:   SUBJECTIVE STATEMENT: Pt reports he is wearing prosthesis for about 2 hours.  Pt  accompanied by: self  PERTINENT HISTORY: R BKA, hx of heart attack 2017 pt has stents, pt is on blood thinners  PAIN:  Are you having pain? No  PRECAUTIONS: Fall  RED FLAGS: None   WEIGHT BEARING RESTRICTIONS: No  FALLS: Has patient fallen in last 6 months? Yes. Number of falls 1  LIVING ENVIRONMENT: Lives with: lives with their daughter Lives in: Mobile home Home Access: Ramped entrance Home layout: One level Stairs: Yes: External: 4 steps; on right going up, on left going up, and can reach both Has following equipment at home: Dan Humphreys - 2 wheeled and [employer] - 4 wheeled  PLOF: Independent  PATIENT GOALS: Walk better  OBJECTIVE:    COGNITION: Overall cognitive status: Within functional limits for tasks assessed  LOWER EXTREMITY ROM:  Active ROM Right eval Left eval  Hip flexion    Hip extension    Hip abduction    Hip adduction    Hip internal rotation    Hip external rotation    Knee flexion    Knee extension    Ankle dorsiflexion    Ankle plantarflexion    Ankle inversion    Ankle eversion     (Blank rows = not tested)  LOWER EXTREMITY MMT:  MMT Right eval Left eval  Hip flexion    Hip extension    Hip abduction    Hip adduction    Hip internal rotation    Hip external rotation    Knee flexion    Knee extension    Ankle dorsiflexion    Ankle plantarflexion    Ankle inversion    Ankle eversion    (Blank rows = not tested)  BED MOBILITY:  Sit to supine Complete Independence Supine to sit Complete Independence Rolling to Right Complete Independence Rolling to Left Complete Independence  TRANSFERS: Sit to stand: Complete Independence Stand to sit: Complete Independence    GAIT: Gait pattern: decreased arm swing- Right, decreased arm swing- Left, decreased step length- Right, decreased step length- Left, decreased stride length, antalgic, lateral lean- Right, lateral lean- Left, and wide BOS Distance walked: 460' Assistive device  utilized: Single point cane Level of assistance: CGA   FUNCTIONAL TESTS:  Timed up and go (TUG): 25 sec with st. cane 10 meter walk test: 0.21 m/s with st. cane    CURRENT PROSTHETIC WEAR ASSESSMENT: Patient is independent with: skin check, residual limb care, and care of non-amputated limb Patient is dependent with: residual limb care, prosthetic cleaning, ply sock cleaning, correct ply sock adjustment, proper wear schedule/adjustment, and proper weight-bearing schedule/adjustment Donning prosthesis: Complete Independence Doffing prosthesis: Complete Independence Prosthetic wear tolerance: 1 hours/day, 7 days/week Prosthetic weight bearing tolerance: 60 minutes Edema: none Residual limb condition: well nourished    TODAY'S TREATMENT:  DATE:  Prosthetic education: - Pt educated on sock adjustments as residual leg goes thorough fluid changes throughout the day. We also discussed progression by 1 hour each week for wear time.   Lateral steps at counter no UE support: 5 laps x 10 feet R and L Fwd and bwd steps at counter no UE support: 5 laps x 10 feet, R and L  cues to reduce lateral sway as much as possible and longer stride length with R LE Partial tandem stance: with 3lb ball toss to rebounder: 2 x 10 R and L Steps: 5x up and down: 20 steps total Resisted walking with steady posterior resistance applied by blue band: 3 x 115' Going up and down ramp fwd and bwd: facing uphill: 5x, facing downhilll: 5x  Pt educated on obtaining st. Point cane with wide tip at the bottom for comfort. PATIENT EDUCATION: Education details: see above Person educated: Patient Education method: Explanation Education comprehension: verbalized understanding  HOME EXERCISE PROGRAM: TBD  ASSESSMENT:  CLINICAL IMPRESSION: Today's session focused on prosthetic sock  adjustment. We progressed gait training and added HEP accordingly. Practiced gait training with cane to reduce compensation with gait without AD.  OBJECTIVE IMPAIRMENTS: Abnormal gait, decreased activity tolerance, decreased balance, decreased endurance, decreased mobility, difficulty walking, impaired sensation, prosthetic dependency , and pain.   ACTIVITY LIMITATIONS: carrying, lifting, bending, standing, squatting, and stairs  PARTICIPATION LIMITATIONS: meal prep, cleaning, shopping, community activity, and yard work  PERSONAL FACTORS: Time since onset of injury/illness/exacerbation are also affecting patient's functional outcome.   REHAB POTENTIAL: Good  CLINICAL DECISION MAKING: Stable/uncomplicated  EVALUATION COMPLEXITY: Low   GOALS: Goals reviewed with patient? Yes  SHORT TERM GOALS: Target date: 06/19/2023    Patient will obtain cane and report at least 75% complieance of use of cane with in home and community mobility. Baseline: is currently using RW or no AD with mobility with prosthetic leg (05/22/23) Goal status: INITIAL  2.  Pt will be able to tolerate wear of prosthetic leg for at least 3 hours total per day to improve wear tolerance. Baseline: 1 hour (05/22/23) Goal status: INITIAL   LONG TERM GOALS: Target date: 08/14/2023    Patient will dem gait speed of >0.8 m/s with or without AD to improve community ambulation and decrease fall risk.  Baseline: 0.21 m/s with st. Cane (05/22/23) Goal status: INITIAL  2.  Pt will demo TUG score of <14 sec with or without AD to improve functional mobility and reduce fall risk. Baseline: 25 sec with st. Cane (05/22/23) Goal status: INITIAL  3.  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 Baseline: not attempted (05/22/23) Goal status: INITIAL  4.  Pt will be I and compliant with HEP to self manage prosthetic wear/care schedule and symptoms.   Baseline: Initiated (05/22/23) Goal status: INITIAL  5.  Pt will be able to use prosthetic leg for >6 hours per day to improve functional mobility in home and community.  Baseline: 1 hour wear tolerance (05/22/23) Goal status: INITIAL    PLAN:  PT FREQUENCY: 1-2x/week  PT DURATION: 12 weeks  PLANNED INTERVENTIONS: Therapeutic exercises, Therapeutic activity, Neuromuscular re-education, Balance training, Gait training, Patient/Family education, Self Care, Joint mobilization, Stair training, Prosthetic training, Cryotherapy, Moist heat, Manual therapy, and Re-evaluation  PLAN FOR NEXT SESSION: Review sock adjustment, cleaning of prosthetis, residual leg care, Continue to practice st. [place] Mor  training with functional gait (get him to use 2 point gait pattern instead of 3 point). Progress with dynamic balance activities.   Check all possible CPT codes: 91478 - PT Re-evaluation, 97110- Therapeutic Exercise, [phone]- Neuro Re-education, [phone] - Gait Training, [phone] - Manual Therapy, [phone] - Therapeutic Activities, [phone] - Self Care, and [phone] - Prosthetic training    Check all conditions that are expected to impact treatment: {Conditions expected to impact treatment:Musculoskeletal disorders   If treatment provided at initial evaluation, no treatment charged due to lack of authorization.      Ileana Ladd, PT 06/05/2023, 12:28 PM

## 2023-06-12 ENCOUNTER — Ambulatory Visit: Payer: Medicaid Other

## 2023-06-12 DIAGNOSIS — Z89511 Acquired absence of right leg below knee: Secondary | ICD-10-CM | POA: Diagnosis not present

## 2023-06-12 DIAGNOSIS — R2689 Other abnormalities of gait and mobility: Secondary | ICD-10-CM

## 2023-06-12 DIAGNOSIS — M6281 Muscle weakness (generalized): Secondary | ICD-10-CM

## 2023-06-12 NOTE — Therapy (Signed)
OUTPATIENT PHYSICAL THERAPY PROSTHETICS TREATMENT NOTE   Patient Name: Evan Rodriguez MRN: [id number] DOB:12/07/1975, 47 y.o., male Today's Date: 06/12/2023  PCP: Dr. Para March REFERRING PROVIDER: Dr. Alanson Aly  END OF SESSION:  PT End of Session - 06/12/23 1154     Visit Number 3    Number of Visits 8    Date for PT Re-Evaluation 08/14/23    Authorization Type [employer] 7 visits approved from 05/22/23-07/20/23    PT Start Time 1150    PT Stop Time 1230    PT Time Calculation (min) 40 min    Equipment Utilized During Treatment Gait belt    Activity Tolerance Patient tolerated treatment well    Behavior During Therapy [facility] for tasks assessed/performed             Past Medical History:  Diagnosis Date   Coronary artery disease    NSTEMI (non-ST elevated myocardial infarction) (HCC) 02/22/2016   Stented   Peripheral vascular disease (HCC)    Splenic vein thrombosis and artery thrombosis    Vaping nicotine dependence, non-tobacco product 01/22/2021   Past Surgical History:  Procedure Laterality Date   ABDOMINAL AORTOGRAM N/A 01/23/2021   Procedure: ABDOMINAL AORTOGRAM;  Surgeon: Leonie Douglas, MD;  Location: [facility] INVASIVE CV LAB;  Service: Cardiovascular;  Laterality: N/A;   AMPUTATION Left 05/24/2021   Procedure: LEFT LEG BELOW KNEE AMPUTATION;  Surgeon: Chuck Hint, MD;  Location: [facility] OR;  Service: Vascular;  Laterality: Left;   BYPASS GRAFT FEMORAL-PERONEAL Left 02/05/2021   Procedure: LEFT SUPERFICIAL FEMORAL-PERONEAL ARTERY BYPASS GRAFT WITH SUBFACIAL, NON-REVERSED  VEIN.;  Surgeon: Chuck Hint, MD;  Location: [facility] OR;  Service: Vascular;  Laterality: Left;   CARDIAC CATHETERIZATION N/A 02/22/2016   Procedure: Left Heart Cath and Coronary Angiography;  Surgeon: Tonny Bollman, MD;  Location: [facility] INVASIVE CV LAB;  Service: Cardiovascular;  Laterality: N/A;   CARDIAC CATHETERIZATION N/A 02/22/2016   Procedure: Coronary Stent  Intervention;  Surgeon: Tonny Bollman, MD;  Location: [facility] INVASIVE CV LAB;  Service: Cardiovascular;  Laterality: N/A;   INTRAOPERATIVE ARTERIOGRAM Left 02/05/2021   Procedure: INTRA OPERATIVE ARTERIOGRAM- LEFT LOWER EXTREMITY;  Surgeon: Chuck Hint, MD;  Location: [facility] OR;  Service: Vascular;  Laterality: Left;   LOWER EXTREMITY ANGIOGRAPHY Left 01/23/2021   Procedure: Lower Extremity Angiography;  Surgeon: Leonie Douglas, MD;  Location: [facility] INVASIVE CV LAB;  Service: Cardiovascular;  Laterality: Left;   PERCUTANEOUS CORONARY STENT INTERVENTION (PCI-S)     WRIST SURGERY     Patient Active Problem List   Diagnosis Date Noted   Mood changes 01/03/2022   Ecchymosis 11/15/2021   Limb pain 11/15/2021   Cellulitis of left lower extremity 05/22/2021   Lower limb ischemia 05/21/2021   Pain and swelling of left lower leg 05/16/2021   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   Coronary artery disease involving native coronary artery of native heart without angina pectoris 05/09/2021   Onychomycosis of toenail 03/25/2021   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  REFERRING DIAG: left BKA  PT  THERAPY DIAG:  Hx of BKA, right (HCC)  Other abnormalities of gait and mobility  Balance disorder  Muscle weakness (generalized)  Rationale for Evaluation and Treatment: Rehabilitation  SUBJECTIVE:   SUBJECTIVE STATEMENT: Pt reports that he has been wearing prosthesis for about 4-5  hours total last week. He is reporting increased pain with prolonged wear. Currently pain is 5/10. Pt accompanied by: self  PERTINENT HISTORY: R BKA, hx of heart attack 2017 pt has stents, pt is on blood thinners  PAIN:  Are you having pain? No  PRECAUTIONS: Fall  RED FLAGS: None   WEIGHT BEARING RESTRICTIONS: No  FALLS: Has patient fallen in last 6 months? Yes. Number of falls 1  LIVING ENVIRONMENT: Lives with: lives with their daughter Lives in: Mobile home Home Access: Ramped entrance Home layout: One level Stairs: Yes: External: 4 steps; on right going up, on left going up, and can reach both Has following equipment at home: Dan Humphreys - 2 wheeled and [employer] - 4 wheeled  PLOF: Independent  PATIENT GOALS: Walk better  OBJECTIVE:    COGNITION: Overall cognitive status: Within functional limits for tasks assessed  LOWER EXTREMITY ROM:  Active ROM Right eval Left eval  Hip flexion    Hip extension    Hip abduction    Hip adduction    Hip internal rotation    Hip external rotation    Knee flexion    Knee extension    Ankle dorsiflexion    Ankle plantarflexion    Ankle inversion    Ankle eversion     (Blank rows = not tested)  LOWER EXTREMITY MMT:  MMT Right eval Left eval  Hip flexion    Hip extension    Hip abduction    Hip adduction    Hip internal rotation    Hip external rotation    Knee flexion    Knee extension    Ankle dorsiflexion    Ankle plantarflexion    Ankle inversion    Ankle eversion    (Blank rows = not tested)  BED MOBILITY:  Sit to supine Complete Independence Supine to sit Complete Independence Rolling to Right Complete Independence Rolling to Left Complete Independence  TRANSFERS: Sit to stand: Complete Independence Stand to sit: Complete Independence    GAIT: Gait pattern: decreased arm swing- Right, decreased arm swing- Left, decreased step length- Right, decreased step length- Left, decreased stride length,  antalgic, lateral lean- Right, lateral lean- Left, and wide BOS Distance walked: 460' Assistive device utilized: Single point cane Level of assistance: CGA   FUNCTIONAL TESTS:  Timed up and go (TUG): 25 sec with st. cane 10 meter walk test: 0.21 m/s with st. cane    CURRENT PROSTHETIC WEAR ASSESSMENT: Patient is independent with: skin check, residual limb care, and care of non-amputated limb Patient is dependent with: residual limb care, prosthetic cleaning, ply sock cleaning, correct ply sock adjustment, proper wear schedule/adjustment, and proper weight-bearing schedule/adjustment Donning prosthesis: Complete Independence Doffing prosthesis: Complete Independence Prosthetic wear tolerance: 1 hours/day, 7 days/week Prosthetic weight bearing tolerance: 60 minutes Edema: none Residual limb condition: well nourished    TODAY'S TREATMENT:  DATE:   Pt educated on neural desensitization with rubbing towel at bottom of the residual leg. PT performed desensitization for 10'. Pt reported pain from 5/10 to 0/10  Standing with one ipsilateral UE support: tapping heel on 4" box in front: 2 x 10 R and L Tandem walk fwd and bwd in parallel bars with intermittent UE support as needed: 10x feet 3x each way Tandem walk on foam beam fwd and bwd: 8 feet: 3x  Standing on wobble board: AP tilts: balloon toss: R and L hand: 2 x 10 R and L; 4lb bil ball toss: 10x ML tilts: balloon toss: r and L hand: 2 x 10 R and L; 4lb bil ball toss: 10  Pt educated on putting on 5 ply sock when he goes home.  Pt educated on obtaining st. Point cane with wide tip at the bottom for comfort. PATIENT EDUCATION: Education details: see above Person educated: Patient Education method: Explanation Education comprehension: verbalized understanding  HOME EXERCISE  PROGRAM: TBD  ASSESSMENT:  CLINICAL IMPRESSION: Today's session focused on teaching pain management with desensitization technique and improving balance on prosthetic leg to improve heel contact on R LE and impact of heel strike during R LE.  OBJECTIVE IMPAIRMENTS: Abnormal gait, decreased activity tolerance, decreased balance, decreased endurance, decreased mobility, difficulty walking, impaired sensation, prosthetic dependency , and pain.   ACTIVITY LIMITATIONS: carrying, lifting, bending, standing, squatting, and stairs  PARTICIPATION LIMITATIONS: meal prep, cleaning, shopping, community activity, and yard work  PERSONAL FACTORS: Time since onset of injury/illness/exacerbation are also affecting patient's functional outcome.   REHAB POTENTIAL: Good  CLINICAL DECISION MAKING: Stable/uncomplicated  EVALUATION COMPLEXITY: Low   GOALS: Goals reviewed with patient? Yes  SHORT TERM GOALS: Target date: 06/19/2023    Patient will obtain cane and report at least 75% complieance of use of cane with in home and community mobility. Baseline: is currently using RW or no AD with mobility with prosthetic leg (05/22/23) Goal status: INITIAL  2.  Pt will be able to tolerate wear of prosthetic leg for at least 3 hours total per day to improve wear tolerance. Baseline: 1 hour (05/22/23) Goal status: INITIAL   LONG TERM GOALS: Target date: 08/14/2023    Patient will dem gait speed of >0.8 m/s with or without AD to improve community ambulation and decrease fall risk.  Baseline: 0.21 m/s with st. Cane (05/22/23) Goal status: INITIAL  2.  Pt will demo TUG score of <14 sec with or without AD to improve functional mobility and reduce fall risk. Baseline: 25 sec with st. Cane (05/22/23) Goal status: INITIAL  3.  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 Baseline: not attempted (05/22/23) Goal status: INITIAL  4.   Pt will be I and compliant with HEP to self manage prosthetic wear/care schedule and symptoms.  Baseline: Initiated (05/22/23) Goal status: INITIAL  5.  Pt will be able to use prosthetic leg for >6 hours per day to improve functional mobility in home and community.  Baseline: 1 hour wear tolerance (05/22/23) Goal status: INITIAL    PLAN:  PT FREQUENCY: 1-2x/week  PT DURATION: 12 weeks  PLANNED INTERVENTIONS: Therapeutic exercises, Therapeutic activity, Neuromuscular re-education, Balance training, Gait training, Patient/Family education, Self Care, Joint mobilization, Stair training, Prosthetic training, Cryotherapy, Moist heat, Manual therapy, and Re-evaluation  PLAN FOR NEXT SESSION: Review sock adjustment, cleaning of prosthetis, residual leg  care, Continue to practice st. Cane training with functional gait (get him to use 2 point gait pattern instead of 3 point). Progress with dynamic balance activities.   Check all possible CPT codes: 32440 - PT Re-evaluation, 97110- Therapeutic Exercise, [phone]- Neuro Re-education, [phone] - Gait Training, [phone] - Manual Therapy, [phone] - Therapeutic Activities, [phone] - Self Care, and [phone] - Prosthetic training    Check all conditions that are expected to impact treatment: {Conditions expected to impact treatment:Musculoskeletal disorders   If treatment provided at initial evaluation, no treatment charged due to lack of authorization.      Ileana Ladd, PT 06/12/2023, 11:55 AM

## 2023-06-25 NOTE — Therapy (Signed)
OUTPATIENT PHYSICAL THERAPY PROSTHETICS TREATMENT NOTE   Patient Name: Evan Rodriguez MRN: [id number] DOB:13-Apr-1976, 47 y.o., male Today's Date: 06/26/2023  PCP: Dr. Para March REFERRING PROVIDER: Dr. Alanson Aly  END OF SESSION:  PT End of Session - 06/26/23 1209     Visit Number 4    Number of Visits 8    Date for PT Re-Evaluation 08/14/23    Authorization Type [employer] 7 visits approved from 05/22/23-07/20/23    PT Start Time 1205    PT Stop Time 1250    PT Time Calculation (min) 45 min    Equipment Utilized During Treatment Gait belt    Activity Tolerance Patient tolerated treatment well    Behavior During Therapy [facility] for tasks assessed/performed              Past Medical History:  Diagnosis Date   Coronary artery disease    NSTEMI (non-ST elevated myocardial infarction) (HCC) 02/22/2016   Stented   Peripheral vascular disease (HCC)    Splenic vein thrombosis and artery thrombosis    Vaping nicotine dependence, non-tobacco product 01/22/2021   Past Surgical History:  Procedure Laterality Date   ABDOMINAL AORTOGRAM N/A 01/23/2021   Procedure: ABDOMINAL AORTOGRAM;  Surgeon: Leonie Douglas, MD;  Location: [facility] INVASIVE CV LAB;  Service: Cardiovascular;  Laterality: N/A;   AMPUTATION Left 05/24/2021   Procedure: LEFT LEG BELOW KNEE AMPUTATION;  Surgeon: Chuck Hint, MD;  Location: [facility] OR;  Service: Vascular;  Laterality: Left;   BYPASS GRAFT FEMORAL-PERONEAL Left 02/05/2021   Procedure: LEFT SUPERFICIAL FEMORAL-PERONEAL ARTERY BYPASS GRAFT WITH SUBFACIAL, NON-REVERSED  VEIN.;  Surgeon: Chuck Hint, MD;  Location: [facility] OR;  Service: Vascular;  Laterality: Left;   CARDIAC CATHETERIZATION N/A 02/22/2016   Procedure: Left Heart Cath and Coronary Angiography;  Surgeon: Tonny Bollman, MD;  Location: [facility] INVASIVE CV LAB;  Service: Cardiovascular;  Laterality: N/A;   CARDIAC CATHETERIZATION N/A 02/22/2016   Procedure: Coronary Stent  Intervention;  Surgeon: Tonny Bollman, MD;  Location: [facility] INVASIVE CV LAB;  Service: Cardiovascular;  Laterality: N/A;   INTRAOPERATIVE ARTERIOGRAM Left 02/05/2021   Procedure: INTRA OPERATIVE ARTERIOGRAM- LEFT LOWER EXTREMITY;  Surgeon: Chuck Hint, MD;  Location: [facility] OR;  Service: Vascular;  Laterality: Left;   LOWER EXTREMITY ANGIOGRAPHY Left 01/23/2021   Procedure: Lower Extremity Angiography;  Surgeon: Leonie Douglas, MD;  Location: [facility] INVASIVE CV LAB;  Service: Cardiovascular;  Laterality: Left;   PERCUTANEOUS CORONARY STENT INTERVENTION (PCI-S)     WRIST SURGERY     Patient Active Problem List   Diagnosis Date Noted   Mood changes 01/03/2022   Ecchymosis 11/15/2021   Limb pain 11/15/2021   Cellulitis of left lower extremity 05/22/2021   Lower limb ischemia 05/21/2021   Pain and swelling of left lower leg 05/16/2021   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   Coronary artery disease involving native coronary artery of native heart without angina pectoris 05/09/2021   Onychomycosis of toenail 03/25/2021   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  REFERRING DIAG: left BKA  PT  THERAPY DIAG:  Hx of BKA, right (HCC)  Other abnormalities of gait and mobility  Balance disorder  Muscle weakness (generalized)  Rationale for Evaluation and Treatment: Rehabilitation  SUBJECTIVE:   SUBJECTIVE STATEMENT: Pt reports things are stable. Currently wearing prosthesis  for about 6 hours. Reports no new falls. Mainly using cane at home.  Pt accompanied by: self  PERTINENT HISTORY: R BKA, hx of heart attack 2017 pt has stents, pt is on blood thinners  PAIN:  Are you having pain? No  PRECAUTIONS: Fall  RED FLAGS: None   WEIGHT BEARING RESTRICTIONS: No  FALLS: Has patient fallen in last 6 months? Yes. Number of falls 1  LIVING ENVIRONMENT: Lives with: lives with their daughter Lives in: Mobile home Home Access: Ramped entrance Home layout: One level Stairs: Yes: External: 4 steps; on right going up, on left going up, and can reach both Has following equipment at home: Dan Humphreys - 2 wheeled and [employer] - 4 wheeled  PLOF: Independent  PATIENT GOALS: Walk better  OBJECTIVE:    COGNITION: Overall cognitive status: Within functional limits for tasks assessed  LOWER EXTREMITY ROM:  Active ROM Right eval Left eval  Hip flexion    Hip extension    Hip abduction    Hip adduction    Hip internal rotation    Hip external rotation    Knee flexion    Knee extension    Ankle dorsiflexion    Ankle plantarflexion    Ankle inversion    Ankle eversion     (Blank rows = not tested)  LOWER EXTREMITY MMT:  MMT Right eval Left eval  Hip flexion    Hip extension    Hip abduction    Hip adduction    Hip internal rotation    Hip external rotation    Knee flexion    Knee extension    Ankle dorsiflexion    Ankle plantarflexion    Ankle inversion    Ankle eversion    (Blank rows = not tested)  BED MOBILITY:  Sit to supine Complete Independence Supine to sit Complete Independence Rolling to Right Complete Independence Rolling to Left Complete Independence  TRANSFERS: Sit to stand: Complete Independence Stand to sit: Complete Independence    GAIT: Gait pattern: decreased arm swing- Right, decreased arm swing- Left, decreased step length- Right, decreased step length- Left, decreased stride length, antalgic, lateral lean- Right,  lateral lean- Left, and wide BOS Distance walked: 460' Assistive device utilized: Single point cane Level of assistance: CGA   FUNCTIONAL TESTS:  Timed up and go (TUG): 25 sec with st. cane 10 meter walk test: 0.21 m/s with st. cane    CURRENT PROSTHETIC WEAR ASSESSMENT: Patient is independent with: skin check, residual limb care, and care of non-amputated limb Patient is dependent with: residual limb care, prosthetic cleaning, ply sock cleaning, correct ply sock adjustment, proper wear schedule/adjustment, and proper weight-bearing schedule/adjustment Donning prosthesis: Complete Independence Doffing prosthesis: Complete Independence Prosthetic wear tolerance: 1 hours/day, 7 days/week Prosthetic weight bearing tolerance: 60 minutes Edema: none Residual limb condition: well nourished    TODAY'S TREATMENT:  DATE:   Standing balance with manual perturbation with blue sport band where PT is pulling patient in multiple direction while patient is maintaining static balance: 2' with EO, 2' with EC, cues to shift weight to L LE  Walking tandem fwd and bwd in parallel bars with one UE support: 5x Blazepods: Lateral steps with taps: random, 6 pods, 3 x 1' with 1' break in between:  Trial 1: 9 hits Trial 2: 11 hits Trial 3: 11 hits Forward and backward walking: 3 pods placed to R and 3 pods placed to Left Trial 1: 10 hits, 1x instance of LOB but pt able to catch himself with stepping strategy without UE assistance while walking backward Trial 2: 10 hits,  Trial 3: this time pt was asked to tap by crossing the leg midline to contralateral pods:  Lateral step down: 8" step:  x 10 R and 6" 10x on L  Pt educated on obtaining st. Point cane with wide tip at the bottom for comfort. PATIENT EDUCATION: Education details: see above Person educated:  Patient Education method: Explanation Education comprehension: verbalized understanding  HOME EXERCISE PROGRAM: TBD  ASSESSMENT:  CLINICAL IMPRESSION: Today's session focused on working on static and dynamic balance in standing and functional walking. Patient is progressing well and gradually increasing his prosthetic wear tolerance without any adverse reactions.   OBJECTIVE IMPAIRMENTS: Abnormal gait, decreased activity tolerance, decreased balance, decreased endurance, decreased mobility, difficulty walking, impaired sensation, prosthetic dependency , and pain.   ACTIVITY LIMITATIONS: carrying, lifting, bending, standing, squatting, and stairs  PARTICIPATION LIMITATIONS: meal prep, cleaning, shopping, community activity, and yard work  PERSONAL FACTORS: Time since onset of injury/illness/exacerbation are also affecting patient's functional outcome.   REHAB POTENTIAL: Good  CLINICAL DECISION MAKING: Stable/uncomplicated  EVALUATION COMPLEXITY: Low   GOALS: Goals reviewed with patient? Yes  SHORT TERM GOALS: Target date: 06/19/2023    Patient will obtain cane and report at least 75% complieance of use of cane with in home and community mobility. Baseline: is currently using RW or no AD with mobility with prosthetic leg (05/22/23) Goal status: INITIAL  2.  Pt will be able to tolerate wear of prosthetic leg for at least 3 hours total per day to improve wear tolerance. Baseline: 1 hour (05/22/23) Goal status: INITIAL   LONG TERM GOALS: Target date: 08/14/2023    Patient will dem gait speed of >0.8 m/s with or without AD to improve community ambulation and decrease fall risk.  Baseline: 0.21 m/s with st. Cane (05/22/23) Goal status: INITIAL  2.  Pt will demo TUG score of <14 sec with or without AD to improve functional mobility and reduce fall risk. Baseline: 25 sec with st. Cane (05/22/23) Goal status: INITIAL  3.  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 Baseline: not attempted (05/22/23) Goal status: INITIAL  4.  Pt will be I and compliant with HEP to self manage prosthetic wear/care schedule and symptoms.  Baseline: Initiated (05/22/23) Goal status: INITIAL  5.  Pt will be able to use prosthetic leg for >6 hours per day to improve functional mobility in home and community.  Baseline: 1 hour wear tolerance (05/22/23) Goal status: INITIAL    PLAN:  PT FREQUENCY: 1-2x/week  PT DURATION: 12 weeks  PLANNED INTERVENTIONS: Therapeutic exercises, Therapeutic activity, Neuromuscular re-education, Balance training, Gait training, Patient/Family education, Self Care, Joint mobilization, Stair training,  Prosthetic training, Cryotherapy, Moist heat, Manual therapy, and Re-evaluation  PLAN FOR NEXT SESSION: Review sock adjustment, cleaning of prosthetis, residual leg care, Continue to practice st. Cane training with functional gait (get him to use 2 point gait pattern instead of 3 point). Progress with dynamic balance activities.   Check all possible CPT codes: 16109 - PT Re-evaluation, 97110- Therapeutic Exercise, [phone]- Neuro Re-education, [phone] - Gait Training, [phone] - Manual Therapy, [phone] - Therapeutic Activities, [phone] - Self Care, and [phone] - Prosthetic training    Check all conditions that are expected to impact treatment: {Conditions expected to impact treatment:Musculoskeletal disorders   If treatment provided at initial evaluation, no treatment charged due to lack of authorization.      Ileana Ladd, PT 06/26/2023, 12:59 PM

## 2023-06-26 ENCOUNTER — Ambulatory Visit: Payer: Medicaid Other

## 2023-06-26 DIAGNOSIS — Z89511 Acquired absence of right leg below knee: Secondary | ICD-10-CM | POA: Diagnosis not present

## 2023-06-26 DIAGNOSIS — R2689 Other abnormalities of gait and mobility: Secondary | ICD-10-CM

## 2023-06-26 DIAGNOSIS — M6281 Muscle weakness (generalized): Secondary | ICD-10-CM

## 2023-07-01 ENCOUNTER — Ambulatory Visit: Payer: Medicaid Other

## 2023-07-08 ENCOUNTER — Ambulatory Visit: Payer: Medicaid Other | Attending: Surgery

## 2023-07-08 DIAGNOSIS — Z89511 Acquired absence of right leg below knee: Secondary | ICD-10-CM | POA: Insufficient documentation

## 2023-07-08 DIAGNOSIS — M6281 Muscle weakness (generalized): Secondary | ICD-10-CM | POA: Insufficient documentation

## 2023-07-08 DIAGNOSIS — R2689 Other abnormalities of gait and mobility: Secondary | ICD-10-CM | POA: Insufficient documentation

## 2023-07-17 ENCOUNTER — Ambulatory Visit: Payer: Medicaid Other

## 2023-07-17 DIAGNOSIS — M6281 Muscle weakness (generalized): Secondary | ICD-10-CM | POA: Diagnosis present

## 2023-07-17 DIAGNOSIS — Z89511 Acquired absence of right leg below knee: Secondary | ICD-10-CM | POA: Diagnosis present

## 2023-07-17 DIAGNOSIS — R2689 Other abnormalities of gait and mobility: Secondary | ICD-10-CM | POA: Diagnosis present

## 2023-07-17 NOTE — Therapy (Signed)
OUTPATIENT PHYSICAL THERAPY PROSTHETICS TREATMENT NOTE- Recertification Note   Patient Name: Evan Rodriguez MRN: [id number] DOB:1976/11/03, 47 y.o., male Today's Date: 07/17/2023  PCP: Dr. Para March REFERRING PROVIDER: Dr. Alanson Aly  END OF SESSION:  PT End of Session - 07/17/23 1154     Visit Number 5    Number of Visits 8    Date for PT Re-Evaluation 09/11/23    Authorization Type [employer] 7 visits approved from 05/22/23-07/20/23; recert on 07/17/23    PT Start Time 1150    PT Stop Time 1230    PT Time Calculation (min) 40 min    Equipment Utilized During Treatment Gait belt    Activity Tolerance Patient tolerated treatment well    Behavior During Therapy [facility] for tasks assessed/performed              Past Medical History:  Diagnosis Date   Coronary artery disease    NSTEMI (non-ST elevated myocardial infarction) (HCC) 02/22/2016   Stented   Peripheral vascular disease (HCC)    Splenic vein thrombosis and artery thrombosis    Vaping nicotine dependence, non-tobacco product 01/22/2021   Past Surgical History:  Procedure Laterality Date   ABDOMINAL AORTOGRAM N/A 01/23/2021   Procedure: ABDOMINAL AORTOGRAM;  Surgeon: Leonie Douglas, MD;  Location: [facility] INVASIVE CV LAB;  Service: Cardiovascular;  Laterality: N/A;   AMPUTATION Left 05/24/2021   Procedure: LEFT LEG BELOW KNEE AMPUTATION;  Surgeon: Chuck Hint, MD;  Location: [facility] OR;  Service: Vascular;  Laterality: Left;   BYPASS GRAFT FEMORAL-PERONEAL Left 02/05/2021   Procedure: LEFT SUPERFICIAL FEMORAL-PERONEAL ARTERY BYPASS GRAFT WITH SUBFACIAL, NON-REVERSED  VEIN.;  Surgeon: Chuck Hint, MD;  Location: [facility] OR;  Service: Vascular;  Laterality: Left;   CARDIAC CATHETERIZATION N/A 02/22/2016   Procedure: Left Heart Cath and Coronary Angiography;  Surgeon: Tonny Bollman, MD;  Location: [facility] INVASIVE CV LAB;  Service: Cardiovascular;  Laterality: N/A;   CARDIAC CATHETERIZATION N/A  02/22/2016   Procedure: Coronary Stent Intervention;  Surgeon: Tonny Bollman, MD;  Location: [facility] INVASIVE CV LAB;  Service: Cardiovascular;  Laterality: N/A;   INTRAOPERATIVE ARTERIOGRAM Left 02/05/2021   Procedure: INTRA OPERATIVE ARTERIOGRAM- LEFT LOWER EXTREMITY;  Surgeon: Chuck Hint, MD;  Location: [facility] OR;  Service: Vascular;  Laterality: Left;   LOWER EXTREMITY ANGIOGRAPHY Left 01/23/2021   Procedure: Lower Extremity Angiography;  Surgeon: Leonie Douglas, MD;  Location: [facility] INVASIVE CV LAB;  Service: Cardiovascular;  Laterality: Left;   PERCUTANEOUS CORONARY STENT INTERVENTION (PCI-S)     WRIST SURGERY     Patient Active Problem List   Diagnosis Date Noted   Mood changes 01/03/2022   Ecchymosis 11/15/2021   Limb pain 11/15/2021   Cellulitis of left lower extremity 05/22/2021   Lower limb ischemia 05/21/2021   Pain and swelling of left lower leg 05/16/2021   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   Coronary artery disease involving native coronary artery of native heart without angina pectoris 05/09/2021   Onychomycosis of toenail 03/25/2021   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  REFERRING DIAG: left BKA  PT  THERAPY DIAG:  Hx of BKA, right (HCC)  Other abnormalities of gait and mobility  Balance disorder  Muscle weakness (generalized)  Rationale for Evaluation and Treatment: Rehabilitation  SUBJECTIVE:   SUBJECTIVE STATEMENT: I went to [employer]  hoping they will put more paddding in there but they told me to put more socks on.  Pt accompanied by: self  PERTINENT HISTORY: R BKA, hx of heart attack 2017 pt has stents, pt is on blood thinners  PAIN:  Are you having pain? No  PRECAUTIONS: Fall  RED FLAGS: None   WEIGHT BEARING RESTRICTIONS: No  FALLS: Has patient fallen in last 6 months? Yes. Number of falls 1  LIVING ENVIRONMENT: Lives with: lives with their daughter Lives in: Mobile home Home Access: Ramped entrance Home layout: One level Stairs: Yes: External: 4 steps; on right going up, on left going up, and can reach both Has following equipment at home: [employer] Humphreys - 2 wheeled and [employer] - 4 wheeled  PLOF: Independent  PATIENT GOALS: Walk better  OBJECTIVE:    COGNITION: Overall cognitive status: Within functional limits for tasks assessed  LOWER EXTREMITY ROM:  Active ROM Right eval Left eval  Hip flexion    Hip extension    Hip abduction    Hip adduction    Hip internal rotation    Hip external rotation    Knee flexion    Knee extension    Ankle dorsiflexion    Ankle plantarflexion    Ankle inversion    Ankle eversion     (Blank rows = not tested)  LOWER EXTREMITY MMT:  MMT Right eval Left eval  Hip flexion    Hip extension    Hip abduction    Hip adduction    Hip internal rotation    Hip external rotation    Knee flexion    Knee extension    Ankle dorsiflexion    Ankle plantarflexion    Ankle inversion    Ankle eversion    (Blank rows = not tested)  BED MOBILITY:  Sit to supine Complete Independence Supine to sit Complete Independence Rolling to Right Complete Independence Rolling to Left Complete Independence  TRANSFERS: Sit to stand: Complete Independence Stand to sit: Complete Independence    GAIT: Gait pattern: decreased arm swing- Right, decreased arm swing- Left, decreased step length- Right, decreased step length- Left, decreased stride length, antalgic, lateral  lean- Right, lateral lean- Left, and wide BOS Distance walked: 460' Assistive device utilized: Single point cane Level of assistance: CGA   FUNCTIONAL TESTS:  Timed up and go (TUG): 25 sec with st. cane 10 meter walk test: 0.21 m/s with st. cane    CURRENT PROSTHETIC WEAR ASSESSMENT: Patient is independent with: skin check, residual limb care, and care of non-amputated limb Patient is dependent with: residual limb care, prosthetic cleaning, ply sock cleaning, correct ply sock adjustment, proper wear schedule/adjustment, and proper weight-bearing schedule/adjustment Donning prosthesis: Complete Independence Doffing prosthesis: Complete Independence Prosthetic wear tolerance: 1 hours/day, 7 days/week Prosthetic weight bearing tolerance: 60 minutes Edema: none Residual limb condition: well nourished    TODAY'S TREATMENT:  DATE:   Reassessment performed today: TUG: 15.5 sec with st. Cane (07/17/23) Gait speed: 0.73 m/s with cane (07/17/23)  Resisted walking with blue sport cord: pulling posterior with combination of static pull, pull and let go, lateral pulls : 545' total  Blaze pods: 6 pods placed on first couple steps: 3 on first step, 3 on second step. Pt asked to tapt the pods with alternating feed without holding on:  Trial 1: intermittent UE support: 13 hits  Trial 2: no UE support: 17 hits  Trial 3:  PATIENT EDUCATION: Education details: see above Person educated: Patient Education method: Explanation Education comprehension: verbalized understanding  HOME EXERCISE PROGRAM: TBD  ASSESSMENT:  CLINICAL IMPRESSION: Patient has been seen for total of 5 sessions to improve gait and mobility s/p R BKA and for prosthetic training. Pt has made a significant progress in his gait speed and timed up and go test. Met all of the his short term  goals. Pt will continue to benefit from skilled PT to progress towards ambulation without AD.   OBJECTIVE IMPAIRMENTS: Abnormal gait, decreased activity tolerance, decreased balance, decreased endurance, decreased mobility, difficulty walking, impaired sensation, prosthetic dependency , and pain.   ACTIVITY LIMITATIONS: carrying, lifting, bending, standing, squatting, and stairs  PARTICIPATION LIMITATIONS: meal prep, cleaning, shopping, community activity, and yard work  PERSONAL FACTORS: Time since onset of injury/illness/exacerbation are also affecting patient's functional outcome.   REHAB POTENTIAL: Good  CLINICAL DECISION MAKING: Stable/uncomplicated  EVALUATION COMPLEXITY: Low   GOALS: Goals reviewed with patient? Yes  SHORT TERM GOALS: Target date: 06/19/2023    Patient will obtain cane and report at least 75% complieance of use of cane with in home and community mobility. Baseline: is currently using RW or no AD with mobility with prosthetic leg (05/22/23); 95% time he is using cane (07/17/23) Goal status: goal met  2.  Pt will be able to tolerate wear of prosthetic leg for at least 3 hours total per day to improve wear tolerance. Baseline: 1 hour (05/22/23) Goal status: goal met (07/17/23)   LONG TERM GOALS: Target date: 09/11/2023    Patient will dem gait speed of >0.8 m/s with or without AD to improve community ambulation and decrease fall risk.  Baseline: 0.21 m/s with st. Cane (05/22/23); 0.73 m/s with st. Cane (07/17/23) Goal status: INITIAL  2.  Pt will demo TUG score of <14 sec with or without AD to improve functional mobility and reduce fall risk. Baseline: 25 sec with st. Cane (05/22/23)' 15.5 sec with cane (07/17/23) Goal status: INITIAL  3.  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 Baseline: not attempted (05/22/23) Goal status: INITIAL  4.  Pt will be I and compliant with HEP to  self manage prosthetic wear/care schedule and symptoms.  Baseline: Initiated (05/22/23) Goal status: progressing   5.  Pt will be able to use prosthetic leg for >6 hours per day to improve functional mobility in home and community.  Baseline: 1 hour wear tolerance (05/22/23); 6 hours (07/17/23) Goal status: Goal met     PLAN:  PT FREQUENCY: 1-2x/week  PT DURATION: 8 weeks  PLANNED INTERVENTIONS: Therapeutic exercises, Therapeutic activity, Neuromuscular re-education, Balance training, Gait training, Patient/Family education, Self Care, Joint mobilization, Stair training, Prosthetic training, Cryotherapy, Moist heat, Manual therapy, and Re-evaluation  PLAN FOR NEXT SESSION: Review sock adjustment, cleaning of prosthetis, residual leg care, Continue  to practice st. Cane training with functional gait (get him to use 2 point gait pattern instead of 3 point). Progress with dynamic balance activities.   Check all possible CPT codes: 40981 - PT Re-evaluation, 97110- Therapeutic Exercise, [phone]- Neuro Re-education, [phone] - Gait Training, [phone] - Manual Therapy, [phone] - Therapeutic Activities, [phone] - Self Care, and [phone] - Prosthetic training    Check all conditions that are expected to impact treatment: {Conditions expected to impact treatment:Musculoskeletal disorders   If treatment provided at initial evaluation, no treatment charged due to lack of authorization.      Ileana Ladd, PT 07/17/2023, 1:08 PM

## 2023-07-24 ENCOUNTER — Ambulatory Visit: Payer: Medicaid Other

## 2023-07-24 DIAGNOSIS — M6281 Muscle weakness (generalized): Secondary | ICD-10-CM

## 2023-07-24 DIAGNOSIS — R2689 Other abnormalities of gait and mobility: Secondary | ICD-10-CM

## 2023-07-24 DIAGNOSIS — Z89511 Acquired absence of right leg below knee: Secondary | ICD-10-CM

## 2023-07-24 NOTE — Therapy (Signed)
OUTPATIENT PHYSICAL THERAPY PROSTHETICS TREATMENT NOTE- Recertification Note   Patient Name: Evan Rodriguez MRN: [id number] DOB:1976-06-03, 47 y.o., male Today's Date: 07/24/2023  PCP: Dr. Para March REFERRING PROVIDER: Dr. Alanson Aly  END OF SESSION:  PT End of Session - 07/24/23 1334     Visit Number 6    Number of Visits 8    Date for PT Re-Evaluation 09/11/23    Authorization Type [employer] 7 visits approved from 05/22/23-07/20/23; recert on 07/17/23    PT Start Time 1215    PT Stop Time 1300    PT Time Calculation (min) 45 min    Equipment Utilized During Treatment Gait belt    Activity Tolerance Patient tolerated treatment well    Behavior During Therapy [facility] for tasks assessed/performed               Past Medical History:  Diagnosis Date   Coronary artery disease    NSTEMI (non-ST elevated myocardial infarction) (HCC) 02/22/2016   Stented   Peripheral vascular disease (HCC)    Splenic vein thrombosis and artery thrombosis    Vaping nicotine dependence, non-tobacco product 01/22/2021   Past Surgical History:  Procedure Laterality Date   ABDOMINAL AORTOGRAM N/A 01/23/2021   Procedure: ABDOMINAL AORTOGRAM;  Surgeon: Leonie Douglas, MD;  Location: [facility] INVASIVE CV LAB;  Service: Cardiovascular;  Laterality: N/A;   AMPUTATION Left 05/24/2021   Procedure: LEFT LEG BELOW KNEE AMPUTATION;  Surgeon: Chuck Hint, MD;  Location: [facility] OR;  Service: Vascular;  Laterality: Left;   BYPASS GRAFT FEMORAL-PERONEAL Left 02/05/2021   Procedure: LEFT SUPERFICIAL FEMORAL-PERONEAL ARTERY BYPASS GRAFT WITH SUBFACIAL, NON-REVERSED  VEIN.;  Surgeon: Chuck Hint, MD;  Location: [facility] OR;  Service: Vascular;  Laterality: Left;   CARDIAC CATHETERIZATION N/A 02/22/2016   Procedure: Left Heart Cath and Coronary Angiography;  Surgeon: Tonny Bollman, MD;  Location: [facility] INVASIVE CV LAB;  Service: Cardiovascular;  Laterality: N/A;   CARDIAC CATHETERIZATION N/A  02/22/2016   Procedure: Coronary Stent Intervention;  Surgeon: Tonny Bollman, MD;  Location: [facility] INVASIVE CV LAB;  Service: Cardiovascular;  Laterality: N/A;   INTRAOPERATIVE ARTERIOGRAM Left 02/05/2021   Procedure: INTRA OPERATIVE ARTERIOGRAM- LEFT LOWER EXTREMITY;  Surgeon: Chuck Hint, MD;  Location: [facility] OR;  Service: Vascular;  Laterality: Left;   LOWER EXTREMITY ANGIOGRAPHY Left 01/23/2021   Procedure: Lower Extremity Angiography;  Surgeon: Leonie Douglas, MD;  Location: [facility] INVASIVE CV LAB;  Service: Cardiovascular;  Laterality: Left;   PERCUTANEOUS CORONARY STENT INTERVENTION (PCI-S)     WRIST SURGERY     Patient Active Problem List   Diagnosis Date Noted   Mood changes 01/03/2022   Ecchymosis 11/15/2021   Limb pain 11/15/2021   Cellulitis of left lower extremity 05/22/2021   Lower limb ischemia 05/21/2021   Pain and swelling of left lower leg 05/16/2021   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   Coronary artery disease involving native coronary artery of native heart without angina pectoris 05/09/2021   Onychomycosis of toenail 03/25/2021   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  REFERRING DIAG: left BKA  PT  THERAPY DIAG:  Hx of BKA, right (HCC)  Other abnormalities of gait and mobility  Muscle weakness (generalized)  Balance disorder  Rationale for Evaluation and Treatment: Rehabilitation  SUBJECTIVE:   SUBJECTIVE STATEMENT: Pt reports something  is binding in the back of the knee. He is pretty much wearing prosthesis all day long Pt accompanied by: self  PERTINENT HISTORY: R BKA, hx of heart attack 2017 pt has stents, pt is on blood thinners  PAIN:  Are you having pain? No  PRECAUTIONS: Fall  RED FLAGS: None   WEIGHT BEARING RESTRICTIONS: No  FALLS: Has patient fallen in last 6 months? Yes. Number of falls 1  LIVING ENVIRONMENT: Lives with: lives with their daughter Lives in: Mobile home Home Access: Ramped entrance Home layout: One level Stairs: Yes: External: 4 steps; on right going up, on left going up, and can reach both Has following equipment at home: [employer] Humphreys - 2 wheeled and [employer] - 4 wheeled  PLOF: Independent  PATIENT GOALS: Walk better  OBJECTIVE:    COGNITION: Overall cognitive status: Within functional limits for tasks assessed  LOWER EXTREMITY ROM:  Active ROM Right eval Left eval  Hip flexion    Hip extension    Hip abduction    Hip adduction    Hip internal rotation    Hip external rotation    Knee flexion    Knee extension    Ankle dorsiflexion    Ankle plantarflexion    Ankle inversion    Ankle eversion     (Blank rows = not tested)  LOWER EXTREMITY MMT:  MMT Right eval Left eval  Hip flexion    Hip extension    Hip abduction    Hip adduction    Hip internal rotation    Hip external rotation    Knee flexion    Knee extension    Ankle dorsiflexion    Ankle plantarflexion    Ankle inversion    Ankle eversion    (Blank rows = not tested)  BED MOBILITY:  Sit to supine Complete Independence Supine to sit Complete Independence Rolling to Right Complete Independence Rolling to Left Complete Independence  TRANSFERS: Sit to stand: Complete Independence Stand to sit: Complete Independence    GAIT: Gait pattern: decreased arm swing- Right, decreased arm swing- Left, decreased step length- Right, decreased step length- Left, decreased stride length, antalgic, lateral  lean- Right, lateral lean- Left, and wide BOS Distance walked: 460' Assistive device utilized: Single point cane Level of assistance: CGA   FUNCTIONAL TESTS:  Timed up and go (TUG): 25 sec with st. cane 10 meter walk test: 0.21 m/s with st. cane    CURRENT PROSTHETIC WEAR ASSESSMENT: Patient is independent with: skin check, residual limb care, and care of non-amputated limb Patient is dependent with: residual limb care, prosthetic cleaning, ply sock cleaning, correct ply sock adjustment, proper wear schedule/adjustment, and proper weight-bearing schedule/adjustment Donning prosthesis: Complete Independence Doffing prosthesis: Complete Independence Prosthetic wear tolerance: 1 hours/day, 7 days/week Prosthetic weight bearing tolerance: 60 minutes Edema: none Residual limb condition: well nourished    TODAY'S TREATMENT:  DATE:   Manually stretched L hip extensors, L hamstrings Pt reported no significant change with flexibility with walking afterwards Pt was wearing sleeve, 5 ply, foam insert, 3 ply and then prosthetic leg per prosthetist recommendation Applied baby oil on back of his knee and pt asked to rewear his prosthetic leg with sleeve, 5 ply, 3 ply, foam insert and prosthetic leg. Pt reported improved comfort in his residual leg. Resisted walking: 1 x 230' with red sport cord Resisted walking: 3 x 40" with red sport cord Lateral walking: red sport cord: 1 x 20 feet R and L Fwd step up and down: R and L, 6" step: no HHA, cues to avoid circumduction Lateral step up and over: 6" step, no HHA, R and L, cues not to circumduct L LE BOSU balance: round side up: wide BOS: horizontal head turns: 1' BOSU squats: flat side up: 10x no HHA  PATIENT EDUCATION: Education details: see above Person educated: Patient Education method:  Explanation Education comprehension: verbalized understanding  HOME EXERCISE PROGRAM: TBD  ASSESSMENT:  CLINICAL IMPRESSION: Todays session focused on dynamic gait and balance exericses. Pt reported improved comfort with application of baby oil and reconfiguration of prosthetic leg components.  OBJECTIVE IMPAIRMENTS: Abnormal gait, decreased activity tolerance, decreased balance, decreased endurance, decreased mobility, difficulty walking, impaired sensation, prosthetic dependency , and pain.   ACTIVITY LIMITATIONS: carrying, lifting, bending, standing, squatting, and stairs  PARTICIPATION LIMITATIONS: meal prep, cleaning, shopping, community activity, and yard work  PERSONAL FACTORS: Time since onset of injury/illness/exacerbation are also affecting patient's functional outcome.   REHAB POTENTIAL: Good  CLINICAL DECISION MAKING: Stable/uncomplicated  EVALUATION COMPLEXITY: Low   GOALS: Goals reviewed with patient? Yes  SHORT TERM GOALS: Target date: 06/19/2023    Patient will obtain cane and report at least 75% complieance of use of cane with in home and community mobility. Baseline: is currently using RW or no AD with mobility with prosthetic leg (05/22/23); 95% time he is using cane (07/17/23) Goal status: goal met  2.  Pt will be able to tolerate wear of prosthetic leg for at least 3 hours total per day to improve wear tolerance. Baseline: 1 hour (05/22/23) Goal status: goal met (07/17/23)   LONG TERM GOALS: Target date: 09/11/2023    Patient will dem gait speed of >0.8 m/s with or without AD to improve community ambulation and decrease fall risk.  Baseline: 0.21 m/s with st. Cane (05/22/23); 0.73 m/s with st. Cane (07/17/23) Goal status: INITIAL  2.  Pt will demo TUG score of <14 sec with or without AD to improve functional mobility and reduce fall risk. Baseline: 25 sec with st. Cane (05/22/23)' 15.5 sec with cane (07/17/23) Goal status: INITIAL  3.  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 Baseline: not attempted (05/22/23) Goal status: INITIAL  4.  Pt will be I and compliant with HEP to self manage prosthetic wear/care schedule and symptoms.  Baseline: Initiated (05/22/23) Goal status: progressing   5.  Pt will be able to use prosthetic leg for >6 hours per day to improve functional mobility in home and community.  Baseline: 1 hour wear tolerance (05/22/23); 6 hours (07/17/23) Goal status: Goal met     PLAN:  PT FREQUENCY: 1-2x/week  PT DURATION: 8 weeks  PLANNED INTERVENTIONS: Therapeutic exercises, Therapeutic activity, Neuromuscular re-education, Balance training, Gait training, Patient/Family education, Self Care, Joint  mobilization, Stair training, Prosthetic training, Cryotherapy, Moist heat, Manual therapy, and Re-evaluation  PLAN FOR NEXT SESSION: Review sock adjustment, cleaning of prosthetis, residual leg care, Continue to practice st. Cane training with functional gait (get him to use 2 point gait pattern instead of 3 point). Progress with dynamic balance activities.   Check all possible CPT codes: 47829 - PT Re-evaluation, 97110- Therapeutic Exercise, [phone]- Neuro Re-education, [phone] - Gait Training, [phone] - Manual Therapy, [phone] - Therapeutic Activities, [phone] - Self Care, and [phone] - Prosthetic training    Check all conditions that are expected to impact treatment: {Conditions expected to impact treatment:Musculoskeletal disorders   If treatment provided at initial evaluation, no treatment charged due to lack of authorization.      Ileana Ladd, PT 07/24/2023, 1:34 PM

## 2023-09-16 ENCOUNTER — Ambulatory Visit (INDEPENDENT_AMBULATORY_CARE_PROVIDER_SITE_OTHER): Payer: Medicaid Other | Admitting: Student

## 2023-09-16 VITALS — BP 103/71 | HR 73 | Ht 66.0 in | Wt 128.6 lb

## 2023-09-16 DIAGNOSIS — Z1211 Encounter for screening for malignant neoplasm of colon: Secondary | ICD-10-CM | POA: Diagnosis not present

## 2023-09-16 DIAGNOSIS — Z13220 Encounter for screening for lipoid disorders: Secondary | ICD-10-CM

## 2023-09-16 DIAGNOSIS — R7303 Prediabetes: Secondary | ICD-10-CM | POA: Insufficient documentation

## 2023-09-16 DIAGNOSIS — I739 Peripheral vascular disease, unspecified: Secondary | ICD-10-CM | POA: Diagnosis not present

## 2023-09-16 DIAGNOSIS — Z131 Encounter for screening for diabetes mellitus: Secondary | ICD-10-CM | POA: Diagnosis not present

## 2023-09-16 DIAGNOSIS — Z0000 Encounter for general adult medical examination without abnormal findings: Secondary | ICD-10-CM | POA: Insufficient documentation

## 2023-09-16 LAB — POCT GLYCOSYLATED HEMOGLOBIN (HGB A1C): Hemoglobin A1C: 5.6 % (ref 4.0–5.6)

## 2023-09-16 MED ORDER — GABAPENTIN 300 MG PO CAPS: 300.0000 mg | ORAL_CAPSULE | Freq: Three times a day (TID) | ORAL | 5 refills | Status: DC

## 2023-09-16 NOTE — Assessment & Plan Note (Signed)
Previous A1c of 6.0, A1c tod 5.6. Will continue to monitor, recommend diet and exercise. -F/u 6 months

## 2023-09-16 NOTE — Progress Notes (Signed)
SUBJECTIVE:   Chief compliant/HPI: annual examination  Evan Rodriguez is a 47 y.o. who presents today for an annual exam.   History tabs reviewed and updated .   Review of systems form reviewed.   Low Mood Patient notes moods been a little low lately secondary to social situation.  Patient has no income, is fighting with lawyers to get disability, no longer has food stamps, and is trying to support his children by watching his grandson.  Patient also notes he would like to get back to work, as he has been working since the age 27.  Patient reports he has tried multiple antidepressants without success, and feels they just try new meds on him.  Patient denies any SI.  Cardiology Patient has not been seen by cardiology in a while. He was supposed to follow up, but had issues with them keeping the appointments/getting the scheduled.  OBJECTIVE:   BP 103/71   Pulse 73   Ht 5\' 6"  (1.676 m)   Wt 128 lb 9.6 oz (58.3 kg)   SpO2 99%   BMI 20.76 kg/m   Physical Exam Constitutional:      General: He is not in acute distress.    Appearance: Normal appearance. He is not ill-appearing.  Cardiovascular:     Rate and Rhythm: Normal rate and regular rhythm.     Pulses: Normal pulses.     Heart sounds: Normal heart sounds. No murmur heard.    No friction rub. No gallop.  Pulmonary:     Effort: Pulmonary effort is normal. No respiratory distress.     Breath sounds: Normal breath sounds. No stridor. No wheezing, rhonchi or rales.  Abdominal:     General: Abdomen is flat. Bowel sounds are normal. There is no distension.     Palpations: Abdomen is soft. There is no mass.     Tenderness: There is no abdominal tenderness. There is no guarding.     Hernia: No hernia is present.  Neurological:     Mental Status: He is alert.  Psychiatric:        Attention and Perception: Attention normal.        Mood and Affect: Affect normal.        Behavior: Behavior normal.        Thought  Content: Thought content normal.      ASSESSMENT/PLAN:   Annual physical exam Patient comes in for annual physical exam, with no complaints.  Patient noted to have a PHQ of 14, appreciating some low mood, denies any SI.  Patient appreciating social circumstance with no income, no disability, wanting to start back work, trying to help family by watching his grandson.  Patient has tried multiple antidepressants, without success, is close to trying today, but discussed psychiatry referral for GeneSight testing-for more specific/medications that will work.  Patient open to this.  Patient also needing to follow-up with cardiology, discussed, plan for patient to make follow-up appointment.  Patient not sexually active, and denies any STI testing.  Will test for cholesterol and diabetes.  Referral made for colonoscopy - A1c, lipid panel - Referral to gastroenterology - Follow-up with cardiology - Follow-up 1 year - Consider psych referral for GeneSight testing for targeted meds  Prediabetes Previous A1c of 6.0, A1c tod 5.6. Will continue to monitor, recommend diet and exercise. -F/u 6 months    Annual Examination  See AVS for recommendations.  PHQ score of 14, no SI/HI, believes it's related to social stressors. Does  not want medication or therapy, discussed going to Psychiatry for Gene Site testing if he wants more specific meds.  Blood pressure value is at goal.   Considered the following screening exams based upon USPSTF recommendations: Diabetes screening: discussed and ordered Screening for elevated cholesterol: discussed and ordered HIV testing: discussed and Not ordered, not sexually active  Neg 01/22/21 Hepatitis C: discussed Neg 01/03/22 Hepatitis B: discussed and Wants to hold off Syphilis if at high risk: discussed and Declined Reviewed risk factors for latent tuberculosis and not indicated Colorectal cancer screening: discussed, colonoscopy ordered if age 29 or over or risk  factors.   Follow up in 1  year or sooner if indicated.    Bess Kinds, MD [facility]

## 2023-09-16 NOTE — Patient Instructions (Addendum)
It was great to see you! Thank you for allowing me to participate in your care!  I recommend that you always bring your medications to each appointment as this makes it easy to ensure we are on the correct medications and helps [place] not miss when refills are needed.  Our plans for today:  - Check up  Checking your cholesterol and checking you for Diabetes  If cholesterol is good, you are okay to take the medicine every other day. If cholesterol needs adjustment, we may switch meds  Making Referral for Colonoscopy, someone should reach out in 2 weeks. If you have not heard anything by then, call the clinic and ask about the referral  Follow up with your Cardiologist  - Mood  Please let us know if you would like to be connected to a Psychiatrist for medication management of your mood. They can do "Gene Site" testing to see what medications will work best for you.   We are checking some labs today, I will call you if they are abnormal will send you a MyChart message or a letter if they are normal.  If you do not hear about your labs in the next 2 weeks please let us know.  Take care and seek immediate care sooner if you develop any concerns.   Dr. Bess Kinds, MD [facility]

## 2023-09-16 NOTE — Assessment & Plan Note (Signed)
Patient comes in for annual physical exam, with no complaints.  Patient noted to have a PHQ of 14, appreciating some low mood, denies any SI.  Patient appreciating social circumstance with no income, no disability, wanting to start back work, trying to help family by watching his grandson.  Patient has tried multiple antidepressants, without success, is close to trying today, but discussed psychiatry referral for GeneSight testing-for more specific/medications that will work.  Patient open to this.  Patient also needing to follow-up with cardiology, discussed, plan for patient to make follow-up appointment.  Patient not sexually active, and denies any STI testing.  Will test for cholesterol and diabetes.  Referral made for colonoscopy - A1c, lipid panel - Referral to gastroenterology - Follow-up with cardiology - Follow-up 1 year - Consider psych referral for GeneSight testing for targeted meds

## 2023-09-17 LAB — LIPID PANEL
Chol/HDL Ratio: 4.1 ratio (ref 0.0–5.0)
Cholesterol, Total: 176 mg/dL (ref 100–199)
HDL: 43 mg/dL (ref >39)
LDL Chol Calc (NIH): 121 mg/dL — ABNORMAL HIGH (ref 0–99)
Triglycerides: 65 mg/dL (ref 0–149)
VLDL Cholesterol Cal: 12 mg/dL (ref 5–40)

## 2023-09-18 ENCOUNTER — Encounter: Payer: Self-pay | Admitting: Student

## 2023-09-24 LAB — LIPID PANEL

## 2024-06-07 ENCOUNTER — Other Ambulatory Visit: Payer: Self-pay

## 2024-06-07 DIAGNOSIS — I739 Peripheral vascular disease, unspecified: Secondary | ICD-10-CM

## 2024-06-07 MED ORDER — GABAPENTIN 300 MG PO CAPS: 300.0000 mg | ORAL_CAPSULE | Freq: Three times a day (TID) | ORAL | 0 refills | Status: DC

## 2024-06-07 NOTE — Telephone Encounter (Signed)
 Patient calls nurse line requesting refill on Gabapentin .   Scheduled patient for follow up and to meet new PCP on 06/28/24.  Patient is asking if he can receive refill to last until this appointment.   Please advise.   Chiquita JAYSON English, RN

## 2024-06-27 NOTE — Progress Notes (Unsigned)
    SUBJECTIVE:   CHIEF COMPLAINT / HPI:   Med refills Eliquis  5mg  Aspirin  81mg  Crestor  40mg  Gabapentin  300mg   Lung ca screening? Confirm smoking hx Colonoscopy  PERTINENT  PMH / PSH: PAD, thrombosis of splenic artery, CAD, hx NSTEMI, tobacco use, dyslipidemia  OBJECTIVE:   There were no vitals taken for this visit.  ***  ASSESSMENT/PLAN:   Assessment & Plan      Elyce Prescott, DO [facility]

## 2024-06-28 ENCOUNTER — Encounter: Payer: Self-pay | Admitting: Family Medicine

## 2024-06-28 ENCOUNTER — Ambulatory Visit (INDEPENDENT_AMBULATORY_CARE_PROVIDER_SITE_OTHER): Admitting: Family Medicine

## 2024-06-28 VITALS — BP 105/82 | HR 66 | Temp 98.2°F | Ht 66.0 in | Wt 131.0 lb

## 2024-06-28 DIAGNOSIS — Z720 Tobacco use: Secondary | ICD-10-CM

## 2024-06-28 DIAGNOSIS — Z1211 Encounter for screening for malignant neoplasm of colon: Secondary | ICD-10-CM

## 2024-06-28 DIAGNOSIS — I748 Embolism and thrombosis of other arteries: Secondary | ICD-10-CM

## 2024-06-28 MED ORDER — ATORVASTATIN CALCIUM 40 MG PO TABS: 40.0000 mg | ORAL_TABLET | Freq: Every day | ORAL | 3 refills | Status: DC

## 2024-06-28 MED ORDER — APIXABAN 5 MG PO TABS: 5.0000 mg | ORAL_TABLET | Freq: Two times a day (BID) | ORAL | 2 refills | Status: DC

## 2024-06-28 MED ORDER — ATORVASTATIN CALCIUM 80 MG PO TABS: 80.0000 mg | ORAL_TABLET | Freq: Every day | ORAL | 3 refills | Status: AC

## 2024-06-28 NOTE — Patient Instructions (Signed)
 Good to see you today - Thank you for coming in  Things we discussed today:  I have refilled your medicines I have ordered your low-dose CT scan, we will notify you when this is scheduled for I sent in a referral for colonoscopy, someone should reach out to you to schedule this from GI  Come back to see me in 6 months

## 2024-06-28 NOTE — Assessment & Plan Note (Addendum)
 Refilled Eliquis  Patient amenable to switch to atorvastatin  from crestor , discussed importance of statin

## 2024-07-01 ENCOUNTER — Telehealth: Payer: Self-pay

## 2024-07-01 NOTE — Telephone Encounter (Signed)
 Patient calls nurse line in regards to Eliquis  prescription.   He reports his copay is ~600 dollars. He reports it has never been that high before.   I called [facility]. They were not running his insurance.   Copay now 4 dollars. The patient has been made aware.

## 2024-07-05 ENCOUNTER — Ambulatory Visit
Admission: RE | Admit: 2024-07-05 | Discharge: 2024-07-05 | Disposition: A | Discharge status: Home / Self Care | Source: Ambulatory Visit | Attending: Family Medicine | Admitting: Family Medicine

## 2024-07-05 DIAGNOSIS — Z720 Tobacco use: Secondary | ICD-10-CM

## 2024-07-13 ENCOUNTER — Telehealth: Payer: Self-pay

## 2024-07-13 ENCOUNTER — Ambulatory Visit: Payer: Self-pay | Admitting: Family Medicine

## 2024-07-13 DIAGNOSIS — R911 Solitary pulmonary nodule: Secondary | ICD-10-CM

## 2024-07-13 DIAGNOSIS — I2510 Atherosclerotic heart disease of native coronary artery without angina pectoris: Secondary | ICD-10-CM

## 2024-07-13 NOTE — Telephone Encounter (Signed)
 [facility] calls nurse line to give call report.   CT Chest results are available for PCP viewing.  Will forward to PCP to make aware.

## 2024-07-18 ENCOUNTER — Telehealth: Payer: Self-pay

## 2024-07-18 NOTE — Telephone Encounter (Signed)
 Patient calls nurse line regarding status of referrals for Cardiology and Pulmonology.   Advised that they are still pending.   Patient would like returned call with specialty office contact info once available.   Forwarding to referral coordinator.   Chiquita JAYSON English, RN

## 2024-07-18 NOTE — Telephone Encounter (Signed)
 Called pt and informed him that the referrals have been sent to the following offices:  Referral sent to: (new address) [facility] [address] [place], [place] [address] [phone] They will call pt to schedule appointment. Margit Dimes, CMA  Referral sent to: [facility]  [address] [place], [place] [address] [phone] Margit Dimes LATHER  Pt took information over the phone. All questions answered.  Margit Dimes, CMA

## 2024-08-09 NOTE — Progress Notes (Unsigned)
 New Patient Pulmonology Office Visit   Subjective:  Patient ID: Evan Rodriguez, male    DOB: Dec 12, 1975  MRN: [id number]  Referred by: Anders Otto DASEN, MD  CC: No chief complaint on file.   HPI Evan Rodriguez is a 48 y.o. male with emphysema who presents for initial consultation in the setting of RUL spiculated pulmonary nodule.  Symptoms Associated with Lung cancer:   {Central Tumor Sx:33645}  {Peripheral Tumor Sx:33646}  {Sx of Metastasis:33647}  {Conditions associated with lung cancer & imp to identify prior to bronch:33648}  {STOPBANG:33649}  {Hx of Anesthesia reactions:33650}  PMH:   Important Medications:   Allergies:   Social History:  {Smoking and Biomass Fuel Exposure:33651}  {Occupational Exposures:33652}  {Military Specific Exposures:33653}  Family History: {Cancer-related QY:66345}  ASA grade:  {ASA GRADE:110003}  Karnofsky Performance Status: {Karnofsky Performance Status:33655}  ECOG Performance Status: {findings; ecog performance status:31780}  {PULM QUESTIONNAIRES (Optional):33196}  ROS  Allergies: Patient has no known allergies.  Current Outpatient Medications:    apixaban  (ELIQUIS ) 5 MG TABS tablet, Take 1 tablet (5 mg total) by mouth 2 (two) times daily., Disp: 60 tablet, Rfl: 2   aspirin  81 MG EC tablet, Take 1 tablet (81 mg total) by mouth daily., Disp: 90 tablet, Rfl: 1   atorvastatin  (LIPITOR ) 80 MG tablet, Take 1 tablet (80 mg total) by mouth daily., Disp: 90 tablet, Rfl: 3   gabapentin  (NEURONTIN ) 300 MG capsule, Take 1 capsule (300 mg total) by mouth 3 (three) times daily., Disp: 90 capsule, Rfl: 0 Past Medical History:  Diagnosis Date   Coronary artery disease    NSTEMI (non-ST elevated myocardial infarction) (HCC) 02/22/2016   Stented   Peripheral vascular disease    Splenic vein thrombosis and artery thrombosis    Vaping nicotine  dependence, non-tobacco product 01/22/2021   Past Surgical History:   Procedure Laterality Date   ABDOMINAL AORTOGRAM N/A 01/23/2021   Procedure: ABDOMINAL AORTOGRAM;  Surgeon: Magda Debby SAILOR, MD;  Location: [facility] INVASIVE CV LAB;  Service: Cardiovascular;  Laterality: N/A;   AMPUTATION Left 05/24/2021   Procedure: LEFT LEG BELOW KNEE AMPUTATION;  Surgeon: Eliza Lonni RAMAN, MD;  Location[facility] OR;  Service: Vascular;  Laterality: Left;   BYPASS GRAFT FEMORAL-PERONEAL Left 02/05/2021   Procedure: LEFT SUPERFICIAL FEMORAL-PERONEAL ARTERY BYPASS GRAFT WITH SUBFACIAL, NON-REVERSED  VEIN.;  Surgeon: Eliza Lonni RAMAN, MD;  Location: [facility] OR;  Service: Vascular;  Laterality: Left;   CARDIAC CATHETERIZATION N/A 02/22/2016   Procedure: Left Heart Cath and Coronary Angiography;  Surgeon: Ozell Fell, MD;  Location: [facility] INVASIVE CV LAB;  Service: Cardiovascular;  Laterality: N/A;   CARDIAC CATHETERIZATION N/A 02/22/2016   Procedure: Coronary Stent Intervention;  Surgeon: Ozell Fell, MD;  Location: [facility] INVASIVE CV LAB;  Service: Cardiovascular;  Laterality: N/A;   INTRAOPERATIVE ARTERIOGRAM Left 02/05/2021   Procedure: INTRA OPERATIVE ARTERIOGRAM- LEFT LOWER EXTREMITY;  Surgeon: Eliza Lonni RAMAN, MD;  Location: [facility] OR;  Service: Vascular;  Laterality: Left;   LOWER EXTREMITY ANGIOGRAPHY Left 01/23/2021   Procedure: Lower Extremity Angiography;  Surgeon: Magda Debby SAILOR, MD;  Location: [facility] INVASIVE CV LAB;  Service: Cardiovascular;  Laterality: Left;   PERCUTANEOUS CORONARY STENT INTERVENTION (PCI-S)     WRIST SURGERY     Family History  Problem Relation Age of Onset   Hypertension Maternal Grandfather    Social History   Socioeconomic History   Marital status: Widowed    Spouse name: Not on file   Number of children: Not on file  Years of education: Not on file   Highest education level: Not on file  Occupational History   Occupation: Truck Driver  Tobacco Use   Smoking status: Every Day    Current packs/day: 1.00    Average packs/day: 1 pack/day for  20.0 years (20.0 ttl pk-yrs)    Types: Cigarettes    Passive exposure: Never   Smokeless tobacco: Former    Types: Chew    Quit date: 02/05/2019   Tobacco comments:    quit chew tobacco in 2020  Vaping Use   Vaping status: Some Days   Devices: Salt Base; refillable.   Substance and Sexual Activity   Alcohol use: Yes    Comment: occasionally/monthly   Drug use: No   Sexual activity: Not on file  Other Topics Concern   Not on file  Social History Narrative   Not on file   Social Drivers of Health   Financial Resource Strain: Not on file  Food Insecurity: Not on file  Transportation Needs: Not on file  Physical Activity: Not on file  Stress: Not on file  Social Connections: Not on file  Intimate Partner Violence: Not on file       Objective:  There were no vitals taken for this visit. {Pulm Vitals (Optional):32837}  Physical Exam  Diagnostic Review:  {Labs (Optional):32838}  LDCT 07/2024: IMPRESSION: Lung-RADS 4A, suspicious. Follow up low-dose chest CT without contrast in 3 months (please use the following order, CT CHEST LCS NODULE FOLLOW-UP W/O CM) is recommended. Alternatively, PET may be considered when there is a solid component 8mm or larger. Right apical density of 9.4 mm could represent scarring or developing neoplasm.   Markedly age advanced coronary artery atherosclerosis. Recommend assessment of coronary risk factors. Consider cardiology consultation.   Aortic atherosclerosis (ICD10-I70.0) and emphysema (ICD10-J43.9).    Assessment & Plan:   Assessment & Plan   No orders of the defined types were placed in this encounter.     No follow-ups on file.   Nicholas Ossa, MD

## 2024-08-10 ENCOUNTER — Ambulatory Visit (INDEPENDENT_AMBULATORY_CARE_PROVIDER_SITE_OTHER): Admitting: Pulmonary Disease

## 2024-08-10 ENCOUNTER — Encounter (HOSPITAL_BASED_OUTPATIENT_CLINIC_OR_DEPARTMENT_OTHER): Payer: Self-pay | Admitting: Pulmonary Disease

## 2024-08-10 VITALS — BP 122/84 | HR 88 | Ht 66.0 in | Wt 136.2 lb

## 2024-08-10 DIAGNOSIS — J432 Centrilobular emphysema: Secondary | ICD-10-CM

## 2024-08-10 DIAGNOSIS — F17200 Nicotine dependence, unspecified, uncomplicated: Secondary | ICD-10-CM

## 2024-08-10 DIAGNOSIS — R911 Solitary pulmonary nodule: Secondary | ICD-10-CM

## 2024-08-10 DIAGNOSIS — F17210 Nicotine dependence, cigarettes, uncomplicated: Secondary | ICD-10-CM | POA: Diagnosis not present

## 2024-08-10 MED ORDER — NICOTINE 7 MG/24HR TD PT24: MEDICATED_PATCH | TRANSDERMAL | 0 refills | Status: AC

## 2024-08-10 MED ORDER — ALBUTEROL SULFATE HFA 108 (90 BASE) MCG/ACT IN AERS: 2.0000 | INHALATION_SPRAY | Freq: Four times a day (QID) | RESPIRATORY_TRACT | 2 refills | Status: AC | PRN

## 2024-08-10 MED ORDER — NICOTINE 21 MG/24HR TD PT24: MEDICATED_PATCH | TRANSDERMAL | 0 refills | Status: DC

## 2024-08-10 MED ORDER — NICOTINE POLACRILEX 4 MG MT LOZG: LOZENGE | OROMUCOSAL | 0 refills | Status: DC

## 2024-08-10 MED ORDER — NICOTINE 14 MG/24HR TD PT24: MEDICATED_PATCH | TRANSDERMAL | 0 refills | Status: DC

## 2024-08-10 NOTE — Patient Instructions (Signed)
  VISIT SUMMARY: Today, we discussed the findings of a lung nodule found on your recent CT scan and the next steps for evaluation. We also addressed your history of emphysema and nicotine  dependence.  YOUR PLAN: RIGHT UPPER LOBE PULMONARY NODULE: A 7x9 mm nodule was found in your right upper lung, which could be an early-stage lung cancer or a benign condition. -We will order a PET scan to check the nodule's activity. -You will have a follow-up appointment in four weeks to discuss the PET scan results and the possibility of a biopsy. -If the PET scan shows increased activity, we may need to perform a biopsy. The risks of a biopsy include infection, bleeding, and lung collapse, but these are rare.  EMPHYSEMA: You have emphysema, which is causing occasional shortness of breath. -We will order a pulmonary function test to assess your lung function and check for COPD. -You will be prescribed an albuterol inhaler to use as needed for shortness of breath.  NICOTINE  DEPENDENCE (CIGARETTES AND VAPING): You have a long history of smoking and are currently using vapes. Quitting smoking is important for your lung health. -We will prescribe nicotine  patches and lozenges to help you quit smoking. -Please call 1-800-QUIT-NOW for support and free nicotine  lozenges.                      Contains text generated by Abridge.                                 Contains text generated by Abridge.

## 2024-08-22 ENCOUNTER — Encounter (HOSPITAL_COMMUNITY)
Admission: RE | Admit: 2024-08-22 | Discharge: 2024-08-22 | Disposition: A | Discharge status: Home / Self Care | Source: Ambulatory Visit | Attending: Pulmonary Disease | Admitting: Pulmonary Disease

## 2024-08-22 DIAGNOSIS — R911 Solitary pulmonary nodule: Secondary | ICD-10-CM | POA: Insufficient documentation

## 2024-08-22 LAB — GLUCOSE, CAPILLARY: Glucose-Capillary: 116 mg/dL — ABNORMAL HIGH (ref 70–99)

## 2024-08-22 MED ORDER — FLUDEOXYGLUCOSE F - 18 (FDG) INJECTION
6.8000 | Freq: Once | INTRAVENOUS | Status: AC
  Administered 2024-08-22: 6.8 via INTRAVENOUS

## 2024-08-23 ENCOUNTER — Other Ambulatory Visit: Payer: Self-pay

## 2024-08-23 DIAGNOSIS — I748 Embolism and thrombosis of other arteries: Secondary | ICD-10-CM

## 2024-08-23 DIAGNOSIS — I739 Peripheral vascular disease, unspecified: Secondary | ICD-10-CM

## 2024-08-24 ENCOUNTER — Telehealth (HOSPITAL_BASED_OUTPATIENT_CLINIC_OR_DEPARTMENT_OTHER): Payer: Self-pay

## 2024-08-24 ENCOUNTER — Ambulatory Visit: Payer: Self-pay | Admitting: Pulmonary Disease

## 2024-08-24 DIAGNOSIS — R911 Solitary pulmonary nodule: Secondary | ICD-10-CM

## 2024-08-24 MED ORDER — GABAPENTIN 300 MG PO CAPS: 300.0000 mg | ORAL_CAPSULE | Freq: Three times a day (TID) | ORAL | 0 refills | Status: DC

## 2024-08-24 MED ORDER — APIXABAN 5 MG PO TABS: 5.0000 mg | ORAL_TABLET | Freq: Two times a day (BID) | ORAL | 2 refills | Status: DC

## 2024-08-24 NOTE — Telephone Encounter (Signed)
 Paperwork is done.

## 2024-08-24 NOTE — Telephone Encounter (Signed)
 Copied from CRM [id number]. Topic: Clinical - Lab/Test Results >> Aug 24, 2024 10:30 AM Rilla NOVAK wrote: Reason for CRM: Patient returning call to office. Per notes, it was a results follow up call from [place]. Patient did not listen to voicemail but will  Please call patient at [phone].   ----------------------------------------------------------------------- From previous Reason for Contact - Other: Reason for CRM: Patient returning call to office. Per notes, it was a results follow up call from [place].  Please call patient at [phone].

## 2024-08-24 NOTE — Telephone Encounter (Signed)
 Yes, called him and did not leave a message but I sent an email instead. Thanks for filling nodify paperwork. I'll catch up with him during next visit.

## 2024-09-06 NOTE — Progress Notes (Signed)
 Established Patient Pulmonology Office Visit   Subjective:  Patient ID: Evan Rodriguez, male    DOB: [birth year]  MRN: [id number]  CC: No chief complaint on file.   HPI  Evan Rodriguez is a 48 y.o. male with emphysema and RUL spiculated solitary pulmonary nodule who presents for follow up.  Last seen on 10/8 and I recommended obtaining PET/CT and PFTs to decide on further evaluation. Discussed smoking cessation.    {PULM QUESTIONNAIRES (Optional):33196}  ROS  {History (Optional):23778}  Current Outpatient Medications:    albuterol (VENTOLIN HFA) 108 (90 Base) MCG/ACT inhaler, Inhale 2 puffs into the lungs every 6 (six) hours as needed for wheezing or shortness of breath., Disp: 8 g, Rfl: 2   apixaban  (ELIQUIS ) 5 MG TABS tablet, Take 1 tablet (5 mg total) by mouth 2 (two) times daily., Disp: 60 tablet, Rfl: 2   aspirin  81 MG EC tablet, Take 1 tablet (81 mg total) by mouth daily., Disp: 90 tablet, Rfl: 1   atorvastatin  (LIPITOR ) 80 MG tablet, Take 1 tablet (80 mg total) by mouth daily., Disp: 90 tablet, Rfl: 3   gabapentin  (NEURONTIN ) 300 MG capsule, Take 1 capsule (300 mg total) by mouth 3 (three) times daily., Disp: 90 capsule, Rfl: 0   nicotine  (NICODERM CQ  - DOSED IN MG/24 HOURS) 14 mg/24hr patch, RX #2 Weeks 5-6: 14 mg x 1 patch daily. Wear for 24 hours. If you have sleep disturbances, remove at bedtime., Disp: 14 patch, Rfl: 0   nicotine  (NICODERM CQ  - DOSED IN MG/24 HOURS) 21 mg/24hr patch, RX #1 Weeks 1-4: 21 mg x 1 patch daily. Wear for 24 hours. If you have sleep disturbances, remove at bedtime., Disp: 28 patch, Rfl: 0   nicotine  (NICODERM CQ  - DOSED IN MG/24 HR) 7 mg/24hr patch, RX #3 Weeks 7-8: 7 mg x 1 patch daily. Wear for 24 hours. If you have sleep disturbances, remove at bedtime., Disp: 14 patch, Rfl: 0   nicotine  polacrilex (COMMIT) 4 MG lozenge, RX #1 Weeks 1-6: 1 lozenge every 1-2 hours. Use at least 9 lozenges per day for the first 6 weeks. Max 20  lozenges per day., Disp: 1008 lozenge, Rfl: 0      Objective:  There were no vitals taken for this visit. {Pulm Vitals (Optional):32837}  Physical Exam   Diagnostic Review:  {Labs (Optional):32838}  LDCT 07/2024: IMPRESSION: Lung-RADS 4A, suspicious. Follow up low-dose chest CT without contrast in 3 months (please use the following order, CT CHEST LCS NODULE FOLLOW-UP W/O CM) is recommended. Alternatively, PET may be considered when there is a solid component 8mm or larger. Right apical density of 9.4 mm could represent scarring or developing neoplasm.   Markedly age advanced coronary artery atherosclerosis. Recommend assessment of coronary risk factors. Consider cardiology consultation.   Aortic atherosclerosis (ICD10-I70.0) and emphysema (ICD10-J43.9).  PET/CT 08/22/2024: IMPRESSION: Right apical spiculated density/scar without significant FDG uptake (less than blood pool) likely representing sequela from chronic infection/inflammation. Please note low grade adenocarcinoma spectrum may appear low FDG avid and can not be completely excluded. Few scattered micronodules are stable and below PET resolution. Recommend continued attention on follow-up to ensure stability.   No suspicious findings to suggest nodal or distant metastasis.   Upper lobe predominant moderate to severe centrilobular emphysematous changes.  Super D CT 08/22/2024: IMPRESSION: Stable right apical spiculated nodule versus focus of scarring. Malignancy can not be excluded. Please refer to same-day PET-CT for further details. Additional scattered micronodules are stable. Follow-up to ensure  stability.    Assessment & Plan:   Assessment & Plan   No orders of the defined types were placed in this encounter.     No follow-ups on file.   Montarius Kitagawa, MD

## 2024-09-07 ENCOUNTER — Ambulatory Visit (INDEPENDENT_AMBULATORY_CARE_PROVIDER_SITE_OTHER): Admitting: Pulmonary Disease

## 2024-09-07 ENCOUNTER — Encounter (HOSPITAL_BASED_OUTPATIENT_CLINIC_OR_DEPARTMENT_OTHER): Payer: Self-pay | Admitting: Pulmonary Disease

## 2024-09-07 VITALS — BP 120/80 | HR 91 | Ht 66.0 in | Wt 134.4 lb

## 2024-09-07 DIAGNOSIS — F17200 Nicotine dependence, unspecified, uncomplicated: Secondary | ICD-10-CM

## 2024-09-07 DIAGNOSIS — R911 Solitary pulmonary nodule: Secondary | ICD-10-CM | POA: Diagnosis not present

## 2024-09-07 NOTE — Patient Instructions (Signed)
  VISIT SUMMARY: Today, we discussed the follow-up for your lung nodule and your nicotine  use. Your recent PET scan showed no signs of cancer, and your blood test indicated a low to moderate risk. We also talked about your use of vape products and nicotine  pouches.  YOUR PLAN: RIGHT UPPER LOBE PULMONARY NODULE: You have a nodule in the upper part of your right lung. Recent tests show a low risk of cancer. -We will schedule a follow-up CT scan at the end of January or beginning of February to check for any changes in the nodule. -If the nodule remains the same size, we will continue to monitor it. -If the nodule grows, we will proceed with a biopsy.  NICOTINE  DEPENDENCE: You use vape products and nicotine  pouches but do not smoke cigarettes. -Continue to avoid cigarette smoking. -We discussed using nicotine  pouches and vaping as alternatives to smoking.   Contains text generated by Abridge.

## 2024-09-19 ENCOUNTER — Ambulatory Visit: Attending: Cardiology | Admitting: Cardiology

## 2024-09-19 ENCOUNTER — Encounter: Payer: Self-pay | Admitting: Cardiology

## 2024-09-19 ENCOUNTER — Other Ambulatory Visit (HOSPITAL_COMMUNITY): Payer: Self-pay

## 2024-09-19 VITALS — BP 124/84 | HR 89 | Ht 66.0 in | Wt 135.0 lb

## 2024-09-19 DIAGNOSIS — I739 Peripheral vascular disease, unspecified: Secondary | ICD-10-CM | POA: Insufficient documentation

## 2024-09-19 DIAGNOSIS — F17211 Nicotine dependence, cigarettes, in remission: Secondary | ICD-10-CM | POA: Insufficient documentation

## 2024-09-19 DIAGNOSIS — I2510 Atherosclerotic heart disease of native coronary artery without angina pectoris: Secondary | ICD-10-CM | POA: Diagnosis not present

## 2024-09-19 DIAGNOSIS — E782 Mixed hyperlipidemia: Secondary | ICD-10-CM | POA: Insufficient documentation

## 2024-09-19 LAB — CBC

## 2024-09-19 MED ORDER — CLOPIDOGREL BISULFATE 75 MG PO TABS
75.0000 mg | ORAL_TABLET | Freq: Every day | ORAL | 3 refills | Status: AC
  Filled 2024-09-19: qty 90, 90d supply, fill #0

## 2024-09-19 NOTE — Patient Instructions (Signed)
 Medication Instructions:  STOP Eliquis    START Plavix 75 mg daily   *If you need a refill on your cardiac medications before your next appointment, please call your pharmacy*  Lab Work: Lipid panel  CBC BMP  If you have labs (blood work) drawn today and your tests are completely normal, you will receive your results only by: MyChart Message (if you have MyChart) OR A paper copy in the mail If you have any lab test that is abnormal or we need to change your treatment, we will call you to review the results.  Testing/Procedures: Lower Extremity Doppler with ABI  Your physician has requested that you have a lower or upper extremity arterial duplex. This test is an ultrasound of the arteries in the legs or arms. It looks at arterial blood flow in the legs and arms. Allow one hour for Lower and Upper Arterial scans. There are no restrictions or special instructions.  ECHOCARDIOGRAM  Your physician has requested that you have an echocardiogram. Echocardiography is a painless test that uses sound waves to create images of your heart. It provides your doctor with information about the size and shape of your heart and how well your heart's chambers and valves are working. This procedure takes approximately one hour. There are no restrictions for this procedure. Please do NOT wear cologne, perfume, aftershave, or lotions (deodorant is allowed). Please arrive 15 minutes prior to your appointment time.  Please note: We ask at that you not bring children with you during ultrasound (echo/ vascular) testing. Due to room size and safety concerns, children are not allowed in the ultrasound rooms during exams. Our front office staff cannot provide observation of children in our lobby area while testing is being conducted. An adult accompanying a patient to their appointment will only be allowed in the ultrasound room at the discretion of the ultrasound technician under special circumstances. We apologize  for any inconvenience.   Please note: We ask at that you not bring children with you during ultrasound (echo/ vascular) testing. Due to room size and safety concerns, children are not allowed in the ultrasound rooms during exams. Our front office staff cannot provide observation of children in our lobby area while testing is being conducted. An adult accompanying a patient to their appointment will only be allowed in the ultrasound room at the discretion of the ultrasound technician under special circumstances. We apologize for any inconvenience.   Follow-Up: At [facility], you and your health needs are our priority.  As part of our continuing mission to provide you with exceptional heart care, our providers are all part of one team.  This team includes your primary Cardiologist (physician) and Advanced Practice Providers or APPs (Physician Assistants and Nurse Practitioners) who all work together to provide you with the care you need, when you need it.  Your next appointment:   6 month(s)  Provider:   Newman JINNY Lawrence, MD

## 2024-09-19 NOTE — Progress Notes (Signed)
 Cardiology Office Note:  .   Date:  09/19/2024  ID:  Evan Rodriguez, DOB [birth year], MRN [id number] PCP: Stoney Blizzard, DO  [facility] Providers Cardiologist:  Newman Lawrence, MD PCP: Stoney Blizzard, DO  Chief Complaint  Patient presents with   Coronary Artery Disease     Evan Rodriguez is a 48 y.o. male with mixed hyperlipidemia, CAD, PAD s/p Lt BKA, h/o splenic vein thrombosis, nicotine  dependence  Discussed the use of AI scribe software for clinical note transcription with the patient, who gave verbal consent to proceed.  History of Present Illness Evan Rodriguez is a 48 year old male with coronary artery disease and peripheral artery disease who presents for evaluation of his anticoagulation therapy and vascular health. He is accompanied by [name], a family member.  He has coronary artery disease with stents placed in 2017 and is on Lipitor  80 mg daily. He has been taking Eliquis  since 2017 following a heart attack and splenic vein thrombosis diagnosed in 2022. The duration of anticoagulation therapy is uncertain.  In 2022, he underwent a left below-knee amputation due to peripheral artery disease. He uses a prosthesis but has limited physical activity. He experiences phantom pain in his left leg and takes gabapentin . No pain in the right leg when walking.  He has emphysema and lung nodules, using inhalers. He is quitting smoking with nicotine  patches and e-cigarettes. No current cigarette use.  He drinks a pot of coffee daily and rarely consumes alcohol. No chest pain or shortness of breath, even with limited activity.      Vitals:   09/19/24 0831  BP: 124/84  Pulse: 89  SpO2: 96%      Review of Systems  Cardiovascular:  Negative for chest pain, dyspnea on exertion, leg swelling, palpitations and syncope.       Phantom pain since left BKA        Studies Reviewed: SABRA        EKG 09/19/2024: Normal sinus rhythm Possible  Inferior infarct (cited on or before 23-May-2021) When compared with ECG of 23-May-2021 18:43, No significant change was found   Cath 09/2024: Mid Cx lesion, 80% stenosed. Post intervention, there is a 0% residual stenosis. Dist LAD lesion, 100% stenosed. Mid LAD lesion, 80% stenosed. Post intervention, there is a 0% residual stenosis. The lesion was previously treated with a stent (unknown type). There is mild left ventricular systolic dysfunction. Mid RCA lesion, 30% stenosed.   1. Severe 2 vessel CAD with successful PCI of the LCx and LAD/diagonal 2. Mild segmental LV contraction abnormality with preserved overall LVEF 3. Reisidual occlusion of the apical LAD    Labs 09/2023: Chol 176, TG 65, HDL 43, LDL 121 HbA1C 5.6%  2022: Hb 13.2 Cr 0.7   Physical Exam Vitals and nursing note reviewed.  Constitutional:      General: He is not in acute distress. Neck:     Vascular: No JVD.  Cardiovascular:     Rate and Rhythm: Normal rate and regular rhythm.     Pulses:          Dorsalis pedis pulses are 0 on the right side.       Posterior tibial pulses are 0 on the right side.     Heart sounds: Normal heart sounds. No murmur heard.    Comments: No signs of acute ischemia right foot  Pulmonary:     Effort: Pulmonary effort is normal.     Breath sounds: Normal breath sounds.  No wheezing or rales.  Musculoskeletal:     Right lower leg: No edema.     Left lower leg: No edema.     Comments: Left BKA stump      VISIT DIAGNOSES:   ICD-10-CM   1. Coronary artery disease involving native coronary artery of native heart without angina pectoris  I25.10 EKG 12-Lead    ECHOCARDIOGRAM COMPLETE    Lipid panel    CBC    Basic metabolic panel with GFR    2. PAD (peripheral artery disease)  I73.9 EKG 12-Lead    VAS US  ABI WITH/WO TBI    VAS US  LOWER EXTREMITY ARTERIAL DUPLEX    3. Mixed hyperlipidemia  E78.2     4. Cigarette nicotine  dependence in remission  F17.211         Evan Rodriguez is a 48 y.o. male with mixed hyperlipidemia, CAD, PAD s/p Lt BKA, h/o splenic vein thrombosis, nicotine  dependence  Assessment & Plan CAD: Multivessel PCI in 2017. No current anginal symptoms with limited physical activity. Previously on Eliquis  for diagnosis of splenic vein thrombosis in 2022 with that also had complete splenic infarct. At this time, I do not think there is any additional benefit with anticoagulation. Therefore, I would choose Plavix 75 mg daily over continuing Eliquis . Currently on Lipitor  80 mg daily.  Uncontrolled lipid panel in 2024.  Check lipid panel today.  Very likely, he may need injectable agents. Recommend heart healthy Mediterranean style diet. Encouraged the patient to walk more using prosthetic left leg.  PAD: S/p left BKA. Absent distal pulses without any acute ischemia in the right foot. Recommend Plavix 75 g daily, Lipitor  80 mg daily, as above. Blood pressure normal.  In future, if he has elevated blood pressure, I would prefer ARB.  Nicotine  dependence, in remission: Nicotine  dependence in remission. Successfully quit smoking. Occasional e-cigarette use. - Continue nicotine  replacement therapy. - Encouraged smoking cessation efforts.    Meds ordered this encounter  Medications   clopidogrel (PLAVIX) 75 MG tablet    Sig: Take 1 tablet (75 mg total) by mouth daily.    Dispense:  90 tablet    Refill:  3     F/u in 6 months  Signed, Newman JINNY Lawrence, MD

## 2024-09-20 ENCOUNTER — Ambulatory Visit: Payer: Self-pay | Admitting: Cardiology

## 2024-09-20 ENCOUNTER — Other Ambulatory Visit (HOSPITAL_BASED_OUTPATIENT_CLINIC_OR_DEPARTMENT_OTHER): Payer: Self-pay | Admitting: Pulmonary Disease

## 2024-09-20 DIAGNOSIS — R6889 Other general symptoms and signs: Secondary | ICD-10-CM

## 2024-09-20 DIAGNOSIS — F17200 Nicotine dependence, unspecified, uncomplicated: Secondary | ICD-10-CM

## 2024-09-20 LAB — CBC
Hematocrit: 46.2 % (ref 37.5–51.0)
Hemoglobin: 15.2 g/dL (ref 13.0–17.7)
MCH: 29.9 pg (ref 26.6–33.0)
MCHC: 32.9 g/dL (ref 31.5–35.7)
MCV: 91 fL (ref 79–97)
Platelets: 280 x10E3/uL (ref 150–450)
RBC: 5.09 x10E6/uL (ref 4.14–5.80)
RDW: 12 % (ref 11.6–15.4)
WBC: 6.6 x10E3/uL (ref 3.4–10.8)

## 2024-09-20 LAB — BASIC METABOLIC PANEL WITH GFR
BUN/Creatinine Ratio: 12 (ref 9–20)
BUN: 10 mg/dL (ref 6–24)
CO2: 23 mmol/L (ref 20–29)
Calcium: 9.5 mg/dL (ref 8.7–10.2)
Chloride: 98 mmol/L (ref 96–106)
Creatinine, Ser: 0.82 mg/dL (ref 0.76–1.27)
Glucose: 74 mg/dL (ref 70–99)
Potassium: 4.7 mmol/L (ref 3.5–5.2)
Sodium: 135 mmol/L (ref 134–144)
eGFR: 107 mL/min/1.73 (ref >59)

## 2024-09-20 LAB — LIPID PANEL
Chol/HDL Ratio: 3.6 ratio (ref 0.0–5.0)
Cholesterol, Total: 120 mg/dL (ref 100–199)
HDL: 33 mg/dL — ABNORMAL LOW (ref >39)
LDL Chol Calc (NIH): 71 mg/dL (ref 0–99)
Triglycerides: 77 mg/dL (ref 0–149)
VLDL Cholesterol Cal: 16 mg/dL (ref 5–40)

## 2024-09-20 MED ORDER — NICOTINE 14 MG/24HR TD PT24: MEDICATED_PATCH | TRANSDERMAL | 0 refills | Status: AC

## 2024-09-20 NOTE — Telephone Encounter (Signed)
 Please advise on refill as I do not see notes in LOV for how long pt should use

## 2024-09-20 NOTE — Telephone Encounter (Signed)
 Copied from CRM [id number]. Topic: Clinical - Medication Refill >> Sep 20, 2024  9:56 AM Corean SAUNDERS wrote: Medication: nicotine  (NICODERM CQ  - DOSED IN MG/24 HOURS) 21 mg/24hr patch  Has the patient contacted their pharmacy? Yes (Agent: If no, request that the patient contact the pharmacy for the refill. If patient does not wish to contact the pharmacy document the reason why and proceed with request.) (Agent: If yes, when and what did the pharmacy advise?)  This is the patient's preferred pharmacy:  [facility] [address] ([address] [place] - [address] [place] ([place]) [place] [address] Phone: [phone] Fax: [phone]   Is this the correct pharmacy for this prescription? No needs a refill If no, delete pharmacy and type the correct one.   Has the prescription been filled recently? Yes  Is the patient out of the medication? Yes  Has the patient been seen for an appointment in the last year OR does the patient have an upcoming appointment? Yes  Can we respond through MyChart? No  Agent: Please be advised that Rx refills may take up to 3 business days. We ask that you follow-up with your pharmacy.

## 2024-09-20 NOTE — Progress Notes (Signed)
 Cholesterol is fairly well-controlled.  Continue current medications.  Thanks MJP

## 2024-09-20 NOTE — Telephone Encounter (Signed)
 Copied from CRM [id number]. Topic: Clinical - Medication Refill >> Sep 20, 2024  9:54 AM Corean SAUNDERS wrote: Medication: nicotine  (NICODERM CQ  - DOSED IN MG/24 HOURS) 21 mg/24hr patch  Has the patient contacted their pharmacy? No, needs to prescription.  (Agent: If no, request that the patient contact the pharmacy for the refill. If patient does not wish to contact the pharmacy document the reason why and proceed with request.) (Agent: If yes, when and what did the pharmacy advise?)  This is the patient's preferred pharmacy:  [facility] [address] ([address] [place] - [address] [place] ([place]) [place] [address] Phone: [phone] Fax: [phone]   Is this the correct pharmacy for this prescription? yes If no, delete pharmacy and type the correct one.   Has the prescription been filled recently? Yes  Is the patient out of the medication? Yes  Has the patient been seen for an appointment in the last year OR does the patient have an upcoming appointment? Yes  Can we respond through MyChart? No  Agent: Please be advised that Rx refills may take up to 3 business days. We ask that you follow-up with your pharmacy.

## 2024-10-04 ENCOUNTER — Other Ambulatory Visit: Payer: Self-pay

## 2024-10-04 ENCOUNTER — Other Ambulatory Visit (HOSPITAL_BASED_OUTPATIENT_CLINIC_OR_DEPARTMENT_OTHER): Payer: Self-pay | Admitting: Pulmonary Disease

## 2024-10-04 DIAGNOSIS — F17200 Nicotine dependence, unspecified, uncomplicated: Secondary | ICD-10-CM

## 2024-10-04 DIAGNOSIS — I739 Peripheral vascular disease, unspecified: Secondary | ICD-10-CM

## 2024-10-04 MED ORDER — GABAPENTIN 300 MG PO CAPS: 300.0000 mg | ORAL_CAPSULE | Freq: Three times a day (TID) | ORAL | 2 refills | Status: AC

## 2024-10-07 ENCOUNTER — Telehealth: Payer: Self-pay

## 2024-10-07 NOTE — Telephone Encounter (Signed)
 Patient calls nurse line requesting rx for sleep. He reports issues with sleep for several months.   He has not tried any OTC sleep remedies.   Advised that patient would need an appointment prior to provider being able to prescribe sleep medication.   Scheduled patient on Tuesday, 12/9 with PCP.   Chiquita JAYSON English, RN

## 2024-10-11 ENCOUNTER — Ambulatory Visit: Admitting: Family Medicine

## 2024-10-11 ENCOUNTER — Encounter: Payer: Self-pay | Admitting: Family Medicine

## 2024-10-11 VITALS — BP 110/69 | HR 74 | Ht 66.0 in | Wt 140.0 lb

## 2024-10-11 DIAGNOSIS — D730 Hyposplenism: Secondary | ICD-10-CM

## 2024-10-11 DIAGNOSIS — Z23 Encounter for immunization: Secondary | ICD-10-CM

## 2024-10-11 DIAGNOSIS — F5104 Psychophysiologic insomnia: Secondary | ICD-10-CM

## 2024-10-11 MED ORDER — SERTRALINE HCL 50 MG PO TABS: 50.0000 mg | ORAL_TABLET | Freq: Every day | ORAL | 3 refills | Status: AC

## 2024-10-11 NOTE — Progress Notes (Signed)
    SUBJECTIVE:   CHIEF COMPLAINT / HPI:   Sleep difficulty x6 months Has trouble falling asleep, states he will get in bed around 8 PM and not fall asleep until 1 or 2 AM, then wake up around 7 Also reports little interest and pleasure in doing things.  States he typically stays in his room all day every day and has not been playing with his grandson as much.  He has been depressed in the past, has tried Lexapro and Prozac in the past with no improvement.  He was previously advised to start therapy but did not. He has been under stress recently due to medical concerns, his blood pressure was elevated at his last visit to the cardiologist and he is undergoing testing at his next visit. He presents today for help to sleep No SI  PERTINENT  PMH / PSH: PAD, NSTEMI, dyslipidemia, HLD  OBJECTIVE:   BP 110/69   Pulse 74   Ht 5' 6 (1.676 m)   Wt 140 lb (63.5 kg)   SpO2 97%   BMI 22.60 kg/m    General: well appearing, NAD Cardiovascular: RRR, no m/r/g Respiratory: normal work of breathing on RA, CTAB Psych: normal affect Flowsheet Row Office Visit from 10/11/2024 in [facility] [place]  PHQ-9 Total Score 18    ASSESSMENT/PLAN:   Assessment & Plan Psychophysiological insomnia Suspect patient's difficulty sleeping is related to underlying depression, PHQ score elevated today.  He is amenable to starting SSRI. Zoloft  50mg  daily Will follow-up in 4 weeks Consider TSH at future visit Functional asplenia Updated vaccines as below Needs meningococcal vaccine at next visit Encounter for immunization Flu, pneumococcal vaccines given today     Shanard Treto, DO [facility]

## 2024-10-11 NOTE — Assessment & Plan Note (Addendum)
 Updated vaccines as below Needs meningococcal vaccine at next visit

## 2024-10-11 NOTE — Patient Instructions (Addendum)
 Good to see you today - Thank you for coming in  Things we discussed today:  We are going to start a medication called Zoloft  to help with your depressive symptoms and sleep. Let me know if you do not tolerate it.  You can visit [email or web address] to look for therapists in [place] that would meet your needs.   Come back to see me in 4 weeks

## 2024-10-31 ENCOUNTER — Ambulatory Visit (HOSPITAL_COMMUNITY)
Admission: RE | Admit: 2024-10-31 | Discharge: 2024-10-31 | Disposition: A | Discharge status: Home / Self Care | Source: Ambulatory Visit | Attending: Cardiology | Admitting: Cardiology

## 2024-10-31 ENCOUNTER — Ambulatory Visit (HOSPITAL_COMMUNITY)
Admission: RE | Admit: 2024-10-31 | Discharge: 2024-10-31 | Disposition: A | Source: Ambulatory Visit | Attending: Cardiology

## 2024-10-31 ENCOUNTER — Ambulatory Visit (HOSPITAL_BASED_OUTPATIENT_CLINIC_OR_DEPARTMENT_OTHER)
Admission: RE | Admit: 2024-10-31 | Discharge: 2024-10-31 | Disposition: A | Discharge status: Home / Self Care | Source: Ambulatory Visit | Attending: Cardiology | Admitting: Cardiology

## 2024-10-31 DIAGNOSIS — I2510 Atherosclerotic heart disease of native coronary artery without angina pectoris: Secondary | ICD-10-CM | POA: Insufficient documentation

## 2024-10-31 DIAGNOSIS — I739 Peripheral vascular disease, unspecified: Secondary | ICD-10-CM

## 2024-10-31 LAB — VAS US ABI WITH/WO TBI: Right ABI: 0.74

## 2024-11-01 LAB — ECHOCARDIOGRAM COMPLETE
AR max vel: 2.87 cm2
AV Area VTI: 2.66 cm2
AV Area mean vel: 2.9 cm2
AV Mean grad: 1 mmHg
AV Peak grad: 2.5 mmHg
Ao pk vel: 0.8 m/s
Area-P 1/2: 4.6 cm2
S' Lateral: 4.09 cm

## 2024-11-14 NOTE — Progress Notes (Signed)
 Moderately reduced circulation in right leg. Mildly reduced heart function, possibly due to prior injury to the heart, or narrowing in heart arteries/s.  Recommend Lexiscan nuclear stress test for risk stratification. Continue current medications.  Thanks MJP

## 2024-11-16 ENCOUNTER — Other Ambulatory Visit: Payer: Self-pay | Admitting: Cardiology

## 2024-11-16 ENCOUNTER — Telehealth (HOSPITAL_COMMUNITY): Payer: Self-pay | Admitting: *Deleted

## 2024-11-16 DIAGNOSIS — R6889 Other general symptoms and signs: Secondary | ICD-10-CM

## 2024-11-16 NOTE — Telephone Encounter (Signed)
 Patient given detailed instructions per Myocardial Perfusion Study Information Sheet for the test on 11/17/2024 at 10:15. Patient notified to arrive 15 minutes early and that it is imperative to arrive on time for appointment to keep from having the test rescheduled.  If you need to cancel or reschedule your appointment, please call the office within 24 hours of your appointment. . Patient verbalized understanding.Evan Rodriguez

## 2024-11-17 ENCOUNTER — Ambulatory Visit (HOSPITAL_COMMUNITY): Admission: RE | Admit: 2024-11-17 | Source: Ambulatory Visit

## 2024-11-21 ENCOUNTER — Telehealth (HOSPITAL_COMMUNITY): Payer: Self-pay | Admitting: *Deleted

## 2024-11-21 NOTE — Telephone Encounter (Signed)
 Patient given detailed instructions per Myocardial Perfusion Study Information Sheet for the test on 11/24/24 @ 10:15 Patient notified to arrive 15 minutes early and that it is imperative to arrive on time for appointment to keep from having the test rescheduled.  If you need to cancel or reschedule your appointment, please call the office within 24 hours of your appointment. . Patient verbalized understanding.Claudene Ronal Quale, RN

## 2024-11-22 ENCOUNTER — Ambulatory Visit (HOSPITAL_COMMUNITY): Admission: RE | Admit: 2024-11-22 | Source: Ambulatory Visit | Attending: Cardiology | Admitting: Cardiology

## 2024-11-24 ENCOUNTER — Ambulatory Visit (HOSPITAL_COMMUNITY)
Admission: RE | Admit: 2024-11-24 | Discharge: 2024-11-24 | Disposition: A | Discharge status: Home / Self Care | Source: Ambulatory Visit | Attending: Cardiology | Admitting: Cardiology

## 2024-11-24 DIAGNOSIS — R6889 Other general symptoms and signs: Secondary | ICD-10-CM | POA: Insufficient documentation

## 2024-11-24 LAB — MYOCARDIAL PERFUSION IMAGING
LV dias vol: 121 mL (ref 62–150)
LV sys vol: 76 mL
Nuc Stress EF: 37 %
Peak HR: 82 {beats}/min
Rest HR: 60 {beats}/min
Rest Nuclear Isotope Dose: 10.6 mCi
SDS: 6
SRS: 17
SSS: 20
ST Depression (mm): 0 mm
Stress Nuclear Isotope Dose: 32 mCi
TID: 1.03

## 2024-11-24 MED ORDER — TECHNETIUM TC 99M TETROFOSMIN IV KIT
32.0000 | PACK | Freq: Once | INTRAVENOUS | Status: AC | PRN
  Administered 2024-11-24: 32 via INTRAVENOUS

## 2024-11-24 MED ORDER — TECHNETIUM TC 99M TETROFOSMIN IV KIT
10.6000 | PACK | Freq: Once | INTRAVENOUS | Status: AC | PRN
  Administered 2024-11-24: 10.6 via INTRAVENOUS

## 2024-11-24 MED ORDER — REGADENOSON 0.4 MG/5ML IV SOLN
INTRAVENOUS | Status: AC
  Filled 2024-11-24: qty 5

## 2024-11-24 MED ORDER — REGADENOSON 0.4 MG/5ML IV SOLN
0.4000 mg | Freq: Once | INTRAVENOUS | Status: AC
  Administered 2024-11-24: 0.4 mg via INTRAVENOUS

## 2024-11-26 ENCOUNTER — Ambulatory Visit: Payer: Self-pay | Admitting: Cardiology

## 2024-11-26 NOTE — Progress Notes (Signed)
 Stress test areas of reduced blood flow to heart muscle.  Recommend follow-up office visit for further discussion and consideration for heart catheterization, in addition to optimizing medical therapy.  Thanks MJP

## 2024-11-29 NOTE — Progress Notes (Signed)
 Noted.  Thanks MJP

## 2024-11-29 NOTE — Progress Notes (Deleted)
" °  Cardiology Office Note   Date:  11/29/2024  ID:  Tejas, [place] 1976-06-25, MRN [id number] PCP: Stoney Blizzard, DO  [facility] Providers Cardiologist:  Newman JINNY Lawrence, MD    History of Present Illness Evan Rodriguez is a 49 y.o. male with a past medical history of HLD, CAD, PAD s/p left BKA, history of splenic vein thrombosis, nicotine  dependence. Presents today for follow up after abnormal stress test   Patient has a known history of CAD. Previously had cath in 2017 and underwent PCI of the Lcx and LAD /diagonal. He has been on eliquis  after he had splenic vein thrombosis in 2022.   In 2022, he underwent a left BKA due to peripheral artery disease. Continues to follow with vascular surgery   Patient underwent echocardiogram 10/31/24 that showed EF 45-50% with regional wall motion abnormalities, normal RV systolic function. Stress test 11/24/24 showed findings consistent with ischemia   CAD  - Previously had PCI of the Lcx and LAD/diagonal in 2017 - Stress test 11/2024 showed findings consistent with ischemia  -  - Continue plavix  75 mg daily  - Continue lipitor  80 mg daily  - Provided prescription for SL Nitroglycerin   - BB ***?  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 ***   PAD  HLD - S/p left BKA. ABIs from 10/2024 indicated moderate right lower extremity arterial disease  - Continue plavix  75 mg daily  - Continue lipitor  80 mg daily  - Lipid panel from 09/2024 showed LDL 71     ROS: ***  Studies Reviewed      *** Risk Assessment/Calculations {Does this patient have ATRIAL FIBRILLATION?:[phone]} No BP recorded.  {Refresh Note OR Click here to enter BP  :1}***       Physical Exam VS:  There were no vitals taken for this visit.       Wt Readings from Last 3 Encounters:  10/11/24 140 lb (63.5 kg)  09/19/24 135 lb (61.2 kg)  09/07/24  134 lb 6.4 oz (61 kg)    GEN: Well nourished, well developed in no acute distress NECK: No JVD; No carotid bruits CARDIAC: ***RRR, no murmurs, rubs, gallops RESPIRATORY:  Clear to auscultation without rales, wheezing or rhonchi  ABDOMEN: Soft, non-tender, non-distended EXTREMITIES:  No edema; No deformity   ASSESSMENT AND PLAN ***    {Are you ordering a CV Procedure (e.g. stress test, cath, DCCV, TEE, etc)?   Press F2        :[id number]}  Dispo: ***  Signed, Rollo FABIENE Louder, PA-C   "

## 2024-12-05 ENCOUNTER — Ambulatory Visit (HOSPITAL_COMMUNITY)

## 2024-12-06 ENCOUNTER — Ambulatory Visit

## 2024-12-06 ENCOUNTER — Encounter: Payer: Self-pay | Admitting: Cardiology

## 2024-12-06 VITALS — BP 116/68 | HR 79 | Resp 16 | Ht 66.0 in | Wt 142.2 lb

## 2024-12-06 DIAGNOSIS — I739 Peripheral vascular disease, unspecified: Secondary | ICD-10-CM

## 2024-12-06 DIAGNOSIS — I5022 Chronic systolic (congestive) heart failure: Secondary | ICD-10-CM

## 2024-12-06 DIAGNOSIS — I25118 Atherosclerotic heart disease of native coronary artery with other forms of angina pectoris: Secondary | ICD-10-CM

## 2024-12-06 DIAGNOSIS — E782 Mixed hyperlipidemia: Secondary | ICD-10-CM | POA: Diagnosis not present

## 2024-12-06 MED ORDER — NITROGLYCERIN 0.4 MG SL SUBL: 0.4000 mg | SUBLINGUAL_TABLET | SUBLINGUAL | 3 refills | Status: AC | PRN

## 2024-12-08 ENCOUNTER — Telehealth: Payer: Self-pay | Admitting: *Deleted

## 2024-12-08 NOTE — Telephone Encounter (Signed)
 Cardiac Catheterization scheduled at [facility] for: Friday December 09, 2024 12 Noon Arrival time [facility] Main Entrance A at: 9:30 AM-needs BMP/CBC  Diet: -May have light meal until 6 AM. (6 hours before procedure time) Approved light meal consists of plain toast, fruit, light soups, crackers.  Hydration: -May drink clear liquids until 2 hours before the procedure.  Approved liquids: Water , clear tea, black coffee, fruit juices-non-citric and without pulp,Gatorade, plain Jello/popsicles.   -Please drink 16 oz of water  2 hours before procedure.  Medication instructions: -Usual morning medications can be taken including aspirin  81 mg and Plavix  75 mg.  Plan to go home the same day, you will only stay overnight if medically necessary.  You must have responsible adult to drive you home.  Someone must be with you the first 24 hours after you arrive home.  Reviewed procedure instructions with patient

## 2024-12-09 ENCOUNTER — Ambulatory Visit (HOSPITAL_COMMUNITY)
Admission: RE | Admit: 2024-12-09 | Discharge: 2024-12-09 | Disposition: A | Attending: Cardiology | Admitting: Cardiology

## 2024-12-09 ENCOUNTER — Encounter (HOSPITAL_COMMUNITY): Admission: RE | Disposition: A | Payer: Self-pay | Attending: Cardiology

## 2024-12-09 ENCOUNTER — Other Ambulatory Visit: Payer: Self-pay

## 2024-12-09 ENCOUNTER — Other Ambulatory Visit (HOSPITAL_COMMUNITY): Payer: Self-pay

## 2024-12-09 DIAGNOSIS — I2510 Atherosclerotic heart disease of native coronary artery without angina pectoris: Secondary | ICD-10-CM

## 2024-12-09 DIAGNOSIS — Z01812 Encounter for preprocedural laboratory examination: Secondary | ICD-10-CM

## 2024-12-09 LAB — BASIC METABOLIC PANEL WITH GFR
Anion gap: 11 (ref 5–15)
BUN: 14 mg/dL (ref 6–20)
CO2: 23 mmol/L (ref 22–32)
Calcium: 8.7 mg/dL — ABNORMAL LOW (ref 8.9–10.3)
Chloride: 101 mmol/L (ref 98–111)
Creatinine, Ser: 0.77 mg/dL (ref 0.61–1.24)
GFR, Estimated: >60 mL/min
Glucose, Bld: 94 mg/dL (ref 70–99)
Potassium: 4.3 mmol/L (ref 3.5–5.1)
Sodium: 134 mmol/L — ABNORMAL LOW (ref 135–145)

## 2024-12-09 LAB — CBC
HCT: 40.4 % (ref 39.0–52.0)
Hemoglobin: 13.5 g/dL (ref 13.0–17.0)
MCH: 30.3 pg (ref 26.0–34.0)
MCHC: 33.4 g/dL (ref 30.0–36.0)
MCV: 90.6 fL (ref 80.0–100.0)
Platelets: 245 10*3/uL (ref 150–400)
RBC: 4.46 MIL/uL (ref 4.22–5.81)
RDW: 13.2 % (ref 11.5–15.5)
WBC: 6.4 10*3/uL (ref 4.0–10.5)
nRBC: 0 % (ref 0.0–0.2)

## 2024-12-09 MED ORDER — HEPARIN SODIUM (PORCINE) 1000 UNIT/ML IJ SOLN
INTRAMUSCULAR | Status: AC
  Filled 2024-12-09: qty 10

## 2024-12-09 MED ORDER — VERAPAMIL HCL 2.5 MG/ML IV SOLN
INTRAVENOUS | Status: AC
  Filled 2024-12-09: qty 2

## 2024-12-09 MED ORDER — METOPROLOL SUCCINATE ER 25 MG PO TB24
12.5000 mg | ORAL_TABLET | Freq: Every day | ORAL | 2 refills | Status: AC
  Filled 2024-12-09: qty 30, 60d supply, fill #0

## 2024-12-09 MED ORDER — ACETAMINOPHEN 325 MG PO TABS: 650.0000 mg | ORAL_TABLET | ORAL | Status: DC | PRN

## 2024-12-09 MED ORDER — SODIUM CHLORIDE 0.9 % IV SOLN
INTRAVENOUS | Status: DC | PRN
  Administered 2024-12-09: 10 mL/h via INTRAVENOUS

## 2024-12-09 MED ORDER — ONDANSETRON HCL 4 MG/2ML IJ SOLN: 4.0000 mg | Freq: Four times a day (QID) | INTRAMUSCULAR | Status: DC | PRN

## 2024-12-09 MED ORDER — LABETALOL HCL 5 MG/ML IV SOLN: 10.0000 mg | INTRAVENOUS | Status: DC | PRN

## 2024-12-09 MED ORDER — HYDRALAZINE HCL 20 MG/ML IJ SOLN: 10.0000 mg | INTRAMUSCULAR | Status: DC | PRN

## 2024-12-09 MED ORDER — FREE WATER: 500.0000 mL | Freq: Once | Status: DC

## 2024-12-09 MED ORDER — LIDOCAINE HCL (PF) 1 % IJ SOLN
INTRAMUSCULAR | Status: DC | PRN
  Administered 2024-12-09: 2 mL via INTRADERMAL

## 2024-12-09 MED ORDER — SODIUM CHLORIDE 0.9% FLUSH: 3.0000 mL | Freq: Two times a day (BID) | INTRAVENOUS | Status: DC

## 2024-12-09 MED ORDER — ASPIRIN 81 MG PO CHEW: 81.0000 mg | CHEWABLE_TABLET | ORAL | Status: DC

## 2024-12-09 MED ORDER — IOHEXOL 350 MG/ML SOLN
INTRAVENOUS | Status: DC | PRN
  Administered 2024-12-09: 60 mL

## 2024-12-09 MED ORDER — SODIUM CHLORIDE 0.9 % IV SOLN: 250.0000 mL | INTRAVENOUS | Status: DC | PRN

## 2024-12-09 MED ORDER — SODIUM CHLORIDE 0.9% FLUSH: 3.0000 mL | INTRAVENOUS | Status: DC | PRN

## 2024-12-09 MED ORDER — VERAPAMIL HCL 2.5 MG/ML IV SOLN
INTRAVENOUS | Status: DC | PRN
  Administered 2024-12-09: 10 mL via INTRA_ARTERIAL

## 2024-12-09 MED ORDER — HEPARIN SODIUM (PORCINE) 1000 UNIT/ML IJ SOLN
INTRAMUSCULAR | Status: DC | PRN
  Administered 2024-12-09: 3500 [IU] via INTRAVENOUS

## 2024-12-09 MED ORDER — SODIUM CHLORIDE 0.9 % IV BOLUS
INTRAVENOUS | Status: DC | PRN
  Administered 2024-12-09: 250 mL via INTRAVENOUS

## 2024-12-09 NOTE — Interval H&P Note (Signed)
 History and Physical Interval Note:  12/09/2024 2:25 PM  Evan Rodriguez  has presented today for surgery, with the diagnosis of abnormal nuc.  The various methods of treatment have been discussed with the patient and family. After consideration of risks, benefits and other options for treatment, the patient has consented to  Procedures: LEFT HEART CATH AND CORONARY ANGIOGRAPHY (N/A) as a surgical intervention.  The patient's history has been reviewed, patient examined, no change in status, stable for surgery.  I have reviewed the patient's chart and labs.  Questions were answered to the patient's satisfaction.     Khelani Kops J Jamille Yoshino

## 2024-12-09 NOTE — H&P (Signed)
 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    Mr. Miles has significant prior CAD history.  Previous cardiac catheterization 02/2016 with complex PCI to LCx and LAD.    Seen by Dr. Lawrence in 09/2024. Noted that he was taking Eliquis  for splenic vein thrombosis in 2022. This was discontinued and Plavix  was started for CAD and PAD. Had absent pulse in the RLE, subsequent ABIs showed moderate PAD in that extremity. Surveillance echo 10/2024 showed LVEF 45-50% with moderate hypokinesis of the LV, basal-mid inferoseptal wall, and inferior wall.  Subsequent stress test 11/24/2024 intermediate risk with findings that are consistent with ischemia. He was scheduled to discuss further treatment.    History of Present Illness: Today he is doing okay. He has been playing with his grandson in the snow without chest pain, but he does have dyspnea. Has been utilizing his inhaler more often for the past couple of days with some improvement in symptoms. Trying to quit smoking, using nicotine  patch, Zyn, and vapes. Was recently told that he has a benign mass in his lung that is being followed. Disability paperwork completed today.  ROS: Denies chest pain, lower extremity edema, palpitations, syncope, abnormal bleeding.   Studies Reviewed: The following studies were reviewed today: Cardiac Studies & Procedures   ______________________________________________________________________________________________ CARDIAC  CATHETERIZATION  CARDIAC CATHETERIZATION 02/22/2016  Conclusion  Mid Cx lesion, 80% stenosed. Post intervention, there is a 0% residual stenosis.  Dist LAD lesion, 100% stenosed.  Mid LAD lesion, 80% stenosed. Post intervention, there is a 0% residual stenosis. The lesion was previously treated with a stent (unknown type).  There is mild left ventricular systolic dysfunction.  Mid RCA lesion, 30% stenosed.  1. Severe 2 vessel CAD with successful PCI of the LCx and LAD/diagonal 2. Mild segmental LV contraction abnormality with preserved overall LVEF 3. Reisidual occlusion of the apical LAD  Recommend ASA/Brilinita x 12 months. Aggrastat  x 12 hours. DC home Sunday am with continued observation tomorrow because of heavy thrombus burden, need for IIbIIIa inhibitor overnight.  Findings Coronary Findings Diagnostic  Dominance: Right  Left Anterior Descending Eccentric. The lesion was previously treated with a stent (unknown type). Lesion treated with 3.5x12 mm Promus DES. Post-PCI there was proximal edge plaque shifting and this was treated with a 4.0x12 mm Xience DES in overlapping fashion. Post-PCI, the stent was 100% occluded. The ACT was immediately checked and was therapeutic. The stented segment was dilated with a 3.25 mm balloon throughout. Aspiration thrombectomy ws performed. Aggrastat  was added. This restored TIMI-3 flow in the vessel. There was 0% residual stenosis.  Third Septal Branch Collaterals 3rd Sept filled by collaterals from Inf Sept.  Collaterals 3rd Sept filled by collaterals from 2nd Mrg.  Left Circumflex Diffuse.  Right Coronary Artery  Intervention  Mid LAD lesion PCI The pre-interventional distal flow is normal (TIMI 3). No pre-stent angioplasty was performed. A drug-eluting stent was placed. Post-stent angioplasty was performed. The post-interventional distal flow is normal (TIMI 3). The intervention was successful. No  complications occurred at this  lesion. treated wtih overlapping 3.5x12 mm Promus DES and 4.0x12 mm Xience DES proximally. See note for details. There is a 0% residual stenosis post intervention.  Mid Cx lesion PCI The pre-interventional distal flow is normal (TIMI 3). Pre-stent angioplasty was performed. A drug-eluting stent was placed. Post-stent angioplasty was performed. The post-interventional distal flow is normal (TIMI 3). The intervention was successful. No complications occurred at this lesion. Lesion treated with 2.5x15 mm balloon, then stented with 3.0x20 Promus DES, post-dilated with 3.25 mm [place] balloon to 16 atm. There is a 0% residual stenosis post intervention.   STRESS TESTS  MYOCARDIAL PERFUSION IMAGING 11/24/2024  Interpretation Summary   Findings are consistent with ischemia. There is a large defect with moderate reduction in uptake present in the mid to basal inferolateral location that is reversible. The study is intermediate risk.   No ST deviation was noted.   LV perfusion is abnormal. There is evidence of ischemia. Defect 1: There is a large defect with moderate reduction in uptake present in the mid to basal inferolateral location(s) that is reversible. There is normal wall motion in the defect area. Consistent with ischemia. Defect 2: There is a large defect with severe reduction in uptake present in the mid to basal inferior location(s) that is fixed. Defect 3: There is a medium defect with moderate reduction in uptake present in the apical inferior, septal and apex location(s).   Left ventricular function is abnormal. End diastolic cavity size is mildly enlarged.   CT images were obtained for attenuation correction and were examined for the presence of coronary calcium  when appropriate.   Coronary calcium  was present on the attenuation correction CT images. Mild coronary calcifications were present. Coronary calcifications were present in the left anterior descending artery and left circumflex artery  distribution(s).   ECHOCARDIOGRAM  ECHOCARDIOGRAM COMPLETE 10/31/2024  Narrative ECHOCARDIOGRAM REPORT    Patient Name:   Evan Rodriguez [facility] Date of Exam: 10/31/2024 Medical Rec #:  [id number]             Height:       66.0 in Accession #:    [id number]            Weight:       140.0 lb Date of Birth:  September 24, 1976             BSA:          1.719 m Patient Age:    48 years              BP:           89/56 mmHg Patient Gender: M                     HR:           62 bpm. Exam Location:  [address]  Procedure: 2D Echo, Cardiac Doppler, Color Doppler and 3D Echo (Both Spectral and Color Flow Doppler were utilized during procedure).  Indications:    Coronary artery disease involving native coronary artery of native heart without angina pectoris [I25.10 (ICD-10-CM)]  History:        Patient has no prior history of Echocardiogram examinations.  Sonographer:    Rosaline Fujisawa MHA, RDMS, RVT, RDCS Referring Phys: [id number] [facility] J Esau Fridman  IMPRESSIONS   1. Left ventricular ejection fraction, by estimation, is 45 to 50%. The left ventricle has mildly decreased function. The left ventricle demonstrates regional wall motion abnormalities (see scoring  diagram/findings for description). Left ventricular diastolic parameters were normal. There is moderate hypokinesis of the left ventricular, basal-mid inferoseptal wall and inferior wall. There is mild hypokinesis of the left ventricular, basal anteroseptal wall. There is moderate hypokinesis of the left ventricular, mid-apical anterolateral wall. 2. Right ventricular systolic function is normal. The right ventricular size is normal. 3. The mitral valve is normal in structure. No evidence of mitral valve regurgitation. No evidence of mitral stenosis. 4. The aortic valve is grossly normal. There is mild calcification of the aortic valve. There is mild thickening of the aortic valve. Aortic valve regurgitation is not visualized. Aortic  valve sclerosis/calcification is present, without any evidence of aortic stenosis. 5. The inferior vena cava is normal in size with greater than 50% respiratory variability, suggesting right atrial pressure of 3 mmHg.  Comparison(s): No prior Echocardiogram.  Conclusion(s)/Recommendation(s): Mildly reduced LVEF with focal wall motion abnormalities as noted.  FINDINGS Left Ventricle: Left ventricular ejection fraction, by estimation, is 45 to 50%. The left ventricle has mildly decreased function. The left ventricle demonstrates regional wall motion abnormalities. Moderate hypokinesis of the left ventricular, basal-mid inferoseptal wall and inferior wall. Mild hypokinesis of the left ventricular, basal anteroseptal wall. Moderate hypokinesis of the left ventricular, mid-apical anterolateral wall. 3D ejection fraction reviewed and evaluated as part of the interpretation. Alternate measurement of EF is felt to be most reflective of LV function. The left ventricular internal cavity size was normal in size. There is no left ventricular hypertrophy. Left ventricular diastolic parameters were normal.  Right Ventricle: The right ventricular size is normal. Right vetricular wall thickness was not well visualized. Right ventricular systolic function is normal.  Left Atrium: Left atrial size was normal in size.  Right Atrium: Right atrial size was normal in size.  Pericardium: There is no evidence of pericardial effusion.  Mitral Valve: The mitral valve is normal in structure. No evidence of mitral valve regurgitation. No evidence of mitral valve stenosis.  Tricuspid Valve: The tricuspid valve is grossly normal. Tricuspid valve regurgitation is trivial. No evidence of tricuspid stenosis.  Aortic Valve: The aortic valve is grossly normal. There is mild calcification of the aortic valve. There is mild thickening of the aortic valve. Aortic valve regurgitation is not visualized. Aortic valve  sclerosis/calcification is present, without any evidence of aortic stenosis. Aortic valve mean gradient measures 1.0 mmHg. Aortic valve peak gradient measures 2.5 mmHg. Aortic valve area, by VTI measures 2.66 cm.  Pulmonic Valve: The pulmonic valve was not well visualized. Pulmonic valve regurgitation is not visualized. No evidence of pulmonic stenosis.  Aorta: The aortic root, ascending aorta, aortic arch and descending aorta are all structurally normal, with no evidence of dilitation or obstruction.  Venous: The inferior vena cava is normal in size with greater than 50% respiratory variability, suggesting right atrial pressure of 3 mmHg.  IAS/Shunts: The atrial septum is grossly normal.  Additional Comments: 3D was performed not requiring image post processing on an independent workstation and was indeterminate.   LEFT VENTRICLE PLAX 2D LVIDd:         5.18 cm   Diastology LVIDs:         4.09 cm   LV e' medial:    7.94 cm/s LV PW:         0.79 cm   LV E/e' medial:  9.0 LV IVS:        0.53 cm   LV e' lateral:   11.40 cm/s LVOT diam:     1.98  cm   LV E/e' lateral: 6.3 LV SV:         47 LV SV Index:   27 LVOT Area:     3.08 cm  3D Volume EF: 3D EF:        55 % LV EDV:       178 ml LV ESV:       80 ml LV SV:        98 ml  RIGHT VENTRICLE            IVC RV Basal diam:  3.22 cm    IVC diam: 0.49 cm RV Mid diam:    3.03 cm RV S prime:     8.70 cm/s TAPSE (M-mode): 1.8 cm  LEFT ATRIUM             Index        RIGHT ATRIUM           Index LA diam:        2.36 cm 1.37 cm/m   RA Area:     11.30 cm LA Vol (A2C):   33.3 ml 19.38 ml/m  RA Volume:   25.40 ml  14.78 ml/m LA Vol (A4C):   27.4 ml 15.94 ml/m LA Biplane Vol: 30.2 ml 17.57 ml/m AORTIC VALVE AV Area (Vmax):    2.87 cm AV Area (Vmean):   2.90 cm AV Area (VTI):     2.66 cm AV Vmax:           79.80 cm/s AV Vmean:          52.300 cm/s AV VTI:            0.176 m AV Peak Grad:      2.5 mmHg AV Mean Grad:      1.0  mmHg LVOT Vmax:         74.40 cm/s LVOT Vmean:        49.300 cm/s LVOT VTI:          0.152 m LVOT/AV VTI ratio: 0.86  AORTA Ao Root diam: 3.01 cm Ao Asc diam:  3.13 cm  MITRAL VALVE MV Area (PHT): 4.60 cm    SHUNTS MV Decel Time: 165 msec    Systemic VTI:  0.15 m MV E velocity: 71.80 cm/s  Systemic Diam: 1.98 cm MV A velocity: 55.50 cm/s MV E/A ratio:  1.29  Shelda Bruckner MD Electronically signed by Shelda Bruckner MD Signature Date/Time: 11/01/2024/8:14:15 AM    Final          ______________________________________________________________________________________________                        Physical Exam: VS: BP 112/74   Pulse 75   Temp 98.3 F (36.8 C) (Oral)   Resp 14   Ht 5' 6 (1.676 m)   Wt 64 kg   SpO2 100%   BMI 22.76 kg/m   GEN: Well-nourished in NAD HEENT: Normal NECK: No JVD CARDIAC: RRR, no murmurs, rubs, gallops RESPIRATORY: Diminished bilaterally ABDOMEN: Soft, non-tender, non-distended MUSCULOSKELETAL: No edema, left BKA SKIN: Warm and dry NEUROLOGIC:  Alert and oriented x 3 PSYCHIATRIC:  Normal affect   Assessment & Plan: 1. CAD: Significant history with most recent [facility] 02/2016 requiring complex PCI to LCx and LAD. Recently had an echo 10/2024 that showed LVEF 45-50% with RWMAs. Stress test intermediate risk for ischemia. EKG today without acute ischemic changes. He does not have chest pain, but he does have increasing  dyspnea on exertion recently. Given his significant cardiac history, he wishes to proceed with cardiac catheterization.  - Check BMET/CBC - these will be drawn the day of his procedure - he is without an ID and [facility] will not draw his labs without one currently - Continue Plavix  75 mg daily - okay to continue throughout procedure - Provided prescription for SL nitroglycerin  as needed - counseled on appropriate use of this - Continue atorvastatin  80 mg daily  2. HFmrEF: Echo 10/2024 showed LVEF 45-50%  with RWMAs. Does have dyspnea on exertion. His weight is up slightly, but he appears euvolemic on exam. - Defer HF GDMT until after cardiac catheterization as above  3. Hyperlipidemia: 09/19/2024 LDL 71, HDL 33, TGs 77, total 120. - Continue atorvastatin  80 mg daily  4. PAD: S/p left BKA. Most recent ABIs 10/2024 showed moderate PAD in the RLE.  Informed Consent   Shared Decision Making/Informed Consent The risks [stroke (1 in 1000), death (1 in 1000), kidney failure [usually temporary] (1 in 500), bleeding (1 in 200), allergic reaction [possibly serious] (1 in 200)], benefits (diagnostic support and management of coronary artery disease) and alternatives of a cardiac catheterization were discussed in detail with Mr. Ouderkirk and he is willing to proceed.      Dispo: Follow-up with Dr. Elmira in one month to discuss cardiac catheterization.  Signed, Newman JINNY Elmira, MD 12/09/2024 2:24 PM [facility]

## 2024-12-09 NOTE — Progress Notes (Signed)
 TR band removed at 1630, gauze dressing applied. Right radial level 0, clean, dry, and intact.

## 2024-12-13 ENCOUNTER — Ambulatory Visit: Payer: Self-pay | Admitting: Family Medicine

## 2024-12-21 ENCOUNTER — Ambulatory Visit (HOSPITAL_BASED_OUTPATIENT_CLINIC_OR_DEPARTMENT_OTHER): Admitting: Pulmonary Disease

## 2024-12-21 ENCOUNTER — Encounter (HOSPITAL_BASED_OUTPATIENT_CLINIC_OR_DEPARTMENT_OTHER)
